# Patient Record
Sex: Male | Born: 1935 | Race: White | Hispanic: No | Smoking: Former smoker
Health system: Southern US, Community
[De-identification: ages and names within clinical notes are randomized; demographics above are authoritative.]

## PROBLEM LIST (undated history)

## (undated) DIAGNOSIS — L820 Inflamed seborrheic keratosis: Secondary | ICD-10-CM

## (undated) DIAGNOSIS — L989 Disorder of the skin and subcutaneous tissue, unspecified: Secondary | ICD-10-CM

## (undated) DIAGNOSIS — J309 Allergic rhinitis, unspecified: Secondary | ICD-10-CM

## (undated) DIAGNOSIS — G8929 Other chronic pain: Secondary | ICD-10-CM

## (undated) DIAGNOSIS — J449 Chronic obstructive pulmonary disease, unspecified: Secondary | ICD-10-CM

## (undated) DIAGNOSIS — G20 Parkinson's disease: Secondary | ICD-10-CM

## (undated) DIAGNOSIS — C801 Malignant (primary) neoplasm, unspecified: Secondary | ICD-10-CM

## (undated) DIAGNOSIS — M25569 Pain in unspecified knee: Secondary | ICD-10-CM

## (undated) DIAGNOSIS — I4821 Permanent atrial fibrillation: Secondary | ICD-10-CM

## (undated) DIAGNOSIS — B351 Tinea unguium: Secondary | ICD-10-CM

## (undated) DIAGNOSIS — M109 Gout, unspecified: Secondary | ICD-10-CM

## (undated) DIAGNOSIS — Z7901 Long term (current) use of anticoagulants: Secondary | ICD-10-CM

## (undated) DIAGNOSIS — E039 Hypothyroidism, unspecified: Secondary | ICD-10-CM

## (undated) DIAGNOSIS — E785 Hyperlipidemia, unspecified: Secondary | ICD-10-CM

## (undated) DIAGNOSIS — K5790 Diverticulosis of intestine, part unspecified, without perforation or abscess without bleeding: Secondary | ICD-10-CM

## (undated) DIAGNOSIS — E7800 Pure hypercholesterolemia, unspecified: Secondary | ICD-10-CM

## (undated) DIAGNOSIS — I495 Sick sinus syndrome: Secondary | ICD-10-CM

## (undated) DIAGNOSIS — R42 Dizziness and giddiness: Secondary | ICD-10-CM

## (undated) DIAGNOSIS — M1990 Unspecified osteoarthritis, unspecified site: Secondary | ICD-10-CM

## (undated) DIAGNOSIS — I2510 Atherosclerotic heart disease of native coronary artery without angina pectoris: Secondary | ICD-10-CM

## (undated) DIAGNOSIS — E559 Vitamin D deficiency, unspecified: Secondary | ICD-10-CM

## (undated) DIAGNOSIS — I10 Essential (primary) hypertension: Secondary | ICD-10-CM

## (undated) DIAGNOSIS — K5900 Constipation, unspecified: Secondary | ICD-10-CM

## (undated) HISTORY — DX: Dizziness and giddiness: R42

## (undated) HISTORY — DX: Pure hypercholesterolemia, unspecified: E78.00

## (undated) HISTORY — DX: Tinea unguium: B35.1

## (undated) HISTORY — DX: Unspecified osteoarthritis, unspecified site: M19.90

## (undated) HISTORY — DX: Essential (primary) hypertension: I10

## (undated) HISTORY — DX: Pain in unspecified knee: M25.569

## (undated) HISTORY — PX: FRACTURE SURGERY: SHX138

## (undated) HISTORY — DX: Allergic rhinitis, unspecified: J30.9

## (undated) HISTORY — DX: Malignant (primary) neoplasm, unspecified: C80.1

## (undated) HISTORY — DX: Hypothyroidism, unspecified: E03.9

## (undated) HISTORY — PX: TYMPANOPLASTY: SHX33

## (undated) HISTORY — DX: Inflamed seborrheic keratosis: L82.0

## (undated) HISTORY — DX: Hyperlipidemia, unspecified: E78.5

## (undated) HISTORY — DX: Diverticulosis of intestine, part unspecified, without perforation or abscess without bleeding: K57.90

## (undated) HISTORY — DX: Permanent atrial fibrillation: I48.21

## (undated) HISTORY — DX: Other chronic pain: G89.29

## (undated) HISTORY — DX: Sick sinus syndrome: I49.5

## (undated) HISTORY — PX: INGUINAL HERNIA REPAIR: SUR1180

## (undated) HISTORY — DX: Gout, unspecified: M10.9

## (undated) HISTORY — DX: Chronic obstructive pulmonary disease, unspecified: J44.9

## (undated) HISTORY — DX: Atherosclerotic heart disease of native coronary artery without angina pectoris: I25.10

## (undated) HISTORY — DX: Long term (current) use of anticoagulants: Z79.01

## (undated) HISTORY — PX: LAPAROSCOPIC PARTIAL COLECTOMY: SHX5907

## (undated) HISTORY — DX: Disorder of the skin and subcutaneous tissue, unspecified: L98.9

## (undated) HISTORY — DX: Constipation, unspecified: K59.00

## (undated) HISTORY — DX: Vitamin D deficiency, unspecified: E55.9

## (undated) HISTORY — DX: Parkinson's disease: G20

---

## 1998-06-28 ENCOUNTER — Ambulatory Visit (HOSPITAL_BASED_OUTPATIENT_CLINIC_OR_DEPARTMENT_OTHER): Admission: RE | Admit: 1998-06-28 | Discharge: 1998-06-28 | Payer: Self-pay | Admitting: General Surgery

## 2000-10-29 ENCOUNTER — Inpatient Hospital Stay (HOSPITAL_COMMUNITY): Admission: AD | Admit: 2000-10-29 | Discharge: 2000-11-01 | Payer: Self-pay | Admitting: Cardiology

## 2000-12-23 ENCOUNTER — Inpatient Hospital Stay (HOSPITAL_COMMUNITY): Admission: AD | Admit: 2000-12-23 | Discharge: 2000-12-24 | Payer: Self-pay | Admitting: Cardiology

## 2001-02-25 ENCOUNTER — Encounter: Admission: RE | Admit: 2001-02-25 | Discharge: 2001-02-25 | Payer: Self-pay | Admitting: Cardiology

## 2001-04-28 ENCOUNTER — Ambulatory Visit (HOSPITAL_COMMUNITY): Admission: RE | Admit: 2001-04-28 | Discharge: 2001-04-28 | Payer: Self-pay | Admitting: *Deleted

## 2001-06-18 HISTORY — PX: PACEMAKER PLACEMENT: SHX43

## 2001-09-29 ENCOUNTER — Ambulatory Visit (HOSPITAL_COMMUNITY): Admission: RE | Admit: 2001-09-29 | Discharge: 2001-09-29 | Payer: Self-pay | Admitting: Cardiology

## 2002-01-12 ENCOUNTER — Ambulatory Visit (HOSPITAL_COMMUNITY): Admission: RE | Admit: 2002-01-12 | Discharge: 2002-01-12 | Payer: Self-pay | Admitting: Cardiology

## 2002-01-20 ENCOUNTER — Ambulatory Visit (HOSPITAL_COMMUNITY): Admission: RE | Admit: 2002-01-20 | Discharge: 2002-01-21 | Payer: Self-pay | Admitting: Cardiology

## 2002-07-07 ENCOUNTER — Ambulatory Visit (HOSPITAL_COMMUNITY): Admission: RE | Admit: 2002-07-07 | Discharge: 2002-07-07 | Payer: Self-pay | Admitting: Cardiology

## 2002-08-06 ENCOUNTER — Ambulatory Visit (HOSPITAL_COMMUNITY): Admission: RE | Admit: 2002-08-06 | Discharge: 2002-08-06 | Payer: Self-pay | Admitting: Critical Care Medicine

## 2002-08-06 ENCOUNTER — Encounter: Payer: Self-pay | Admitting: Critical Care Medicine

## 2004-01-26 ENCOUNTER — Ambulatory Visit (HOSPITAL_COMMUNITY): Admission: RE | Admit: 2004-01-26 | Discharge: 2004-01-26 | Payer: Self-pay | Admitting: Cardiology

## 2005-02-14 ENCOUNTER — Ambulatory Visit (HOSPITAL_COMMUNITY): Admission: RE | Admit: 2005-02-14 | Discharge: 2005-02-14 | Payer: Self-pay | Admitting: Cardiology

## 2006-03-22 ENCOUNTER — Ambulatory Visit (HOSPITAL_COMMUNITY): Admission: RE | Admit: 2006-03-22 | Discharge: 2006-03-22 | Payer: Self-pay | Admitting: Cardiology

## 2007-03-11 ENCOUNTER — Ambulatory Visit (HOSPITAL_COMMUNITY): Admission: RE | Admit: 2007-03-11 | Discharge: 2007-03-11 | Payer: Self-pay | Admitting: Cardiology

## 2008-03-18 ENCOUNTER — Encounter: Admission: RE | Admit: 2008-03-18 | Discharge: 2008-03-18 | Payer: Self-pay | Admitting: Cardiology

## 2008-03-29 ENCOUNTER — Ambulatory Visit (HOSPITAL_COMMUNITY): Admission: RE | Admit: 2008-03-29 | Discharge: 2008-03-29 | Payer: Self-pay | Admitting: Cardiology

## 2008-07-29 ENCOUNTER — Ambulatory Visit (HOSPITAL_COMMUNITY): Admission: RE | Admit: 2008-07-29 | Discharge: 2008-07-29 | Payer: Self-pay | Admitting: Cardiology

## 2010-11-03 NOTE — Cardiovascular Report (Signed)
[place]. [facility]  Patient:    Jerry Wood, Jerry Wood                       MRN: [id number] Proc. Date: 12/23/00 Adm. Date:  [id number] Attending:  Armanda Magic CC:         Pearla Dubonnet, M.D.   Cardiac Catheterization  REFERRING PHYSICIAN:  Lum Babe, M.D.; Pearla Dubonnet, M.D.  PROCEDURES:  Left heart catheterization, coronary arteriography, left ventriculography.  OPERATOR:  Armanda Magic, M.D.  INDICATIONS:  Abnormal Cardiolite, atrial fibrillation, history of coronary disease.  COMPLICATIONS:  None.  IV ACCESS:  Via right femoral artery, 6-French sheath.  HISTORY OF PRESENT ILLNESS:  This is a 77 year old white male with a history of coronary artery disease status post subendocardial myocardial infarction in 1993.  He presented to my office with atrial fibrillation and underwent D-C cardioversion which was successful in converting to sinus rhythm but then had recurrent atrial fibrillation and was placed on amiodarone and then cardioverted again.  He now presents for cardiac catheterization because of an abnormal Cardiolite that was performed.  His Cardiolite showed inferior septal and apical ischemia.  DESCRIPTION OF PROCEDURE:  The patient was brought to the cardiac catheterization laboratory in a fasting, nonsedated state.  Informed consent was obtained.  The patient was connected to continuous heart rate and pulse oximetry monitoring and intermittent blood pressure monitoring.  The right groin was prepped and draped in a sterile fashion.  Xylocaine, 1%, was used for local anesthesia.  Using the modified Seldinger technique, a 6-French sheath was placed in the right femoral artery.  Under fluoroscopic guidance, a 6-French JL4 catheter was placed in the left coronary artery.  Multiple cine films were taken in a 30-degree RAO, 40-degree LAO view.  This catheter was then exchanged out for a 6-French JR4 catheter which was placed in  the right coronary artery.  Multiple cine films were taken in a 30-degree RAO, 40-degree LAO view.  This catheter was then exchanged out over a guidewire for a 6-French straight pigtail catheter which was placed in the left ventricular cavity.  Left ventriculography was then performed in the 30-degree RAO view. The catheter was then pulled back across the aortic valve without any significant pressure gradient noted.  At the end of the procedure, the pigtail catheter was removed over a guidewire.  The patient then went on to percutaneous transluminal intervention by Dr. Katrinka Blazing.  RESULTS: 1. The left main coronary artery was widely patent throughout the course and    bifurcates into a left anterior descending artery and left circumflex. 2. The left anterior descending artery is widely patent throughout the course    and gives rise to a first diagonal which is widely patent and a second    diagonal which has a 30-40% proximal narrowing.  There are luminal    irregularities throughout the mid and distal LAD. 3. The left circumflex gives rise to a very high obtuse marginal branch which    is small and widely patent.  The left circumflex then gives rise to a    second obtuse marginal branch which is widely patent throughout the course    and bifurcates into two daughter branches.  The rest of the left circumflex    traverses the AV groove and is widely patent. 4. The right coronary artery gives rise to an acute marginal branch which was    widely patent.  Then in the mid to  distal portion of the body near a bend,    there is a 70-80% eccentric narrowing.  The distal right coronary artery    bifurcates into a posterior descending artery and a posterolateral artery,    both of which are widely patent.  Left ventriculography performed in the 30-degree RAO view using a total of 24 cc of contrast at 12 cc per second showed normal LV function.  Left ventricular pressure was 128/15 mmHg.  Aortic  pressure was 128/67 mmHg.  IMPRESSION: 1. One-vessel obstructive coronary disease. 2. Normal left ventricular function.  PLAN:  PCI per Dr. Katrinka Blazing. DD:  12/23/00 TD:  12/23/00 Job: [id number]

## 2010-11-03 NOTE — Cardiovascular Report (Signed)
[place]. [facility]  Patient:    Jerry Wood, Jerry Wood                       MRN: [id number] Proc. Date: 12/23/00 Adm. Date:  [id number] Attending:  Armanda Magic CC:         Pearla Dubonnet, M.D.  Armanda Magic, M.D.   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  PROCEDURE PERFORMED: 1. Moderate-risk percutaneous intervention on right coronary artery due to    vessel tortuosity. 2. Coronary stent implantation.  DESCRIPTION:  After informed consent, a 6-French sheath was inserted into the right femoral vein and a percutaneous temporary transvenous pacemaker was placed in the right ventricular apex and ventricular pacing established.  The indication for the pacemaker was bradycardia with a heart rate of 40 at rest. Since we are going to be working in the right coronary territory, I wanted to protect against excessive bradycardia if there was inferior wall ischemia.  We then exchanged the 6-French diagnostic right femoral sheath for a 7-French sheath and percutaneous intervention was performed after 3000 units of IV heparin in a double bolus, followed by an infusion of Integrilin.  We used a 7-French sidehole hockeystick guide catheter, a 300-cm long BMW wire and a 3.5-mm, 15-mm long CrossSail balloon.  One balloon inflation to 8 atmospheres was performed.  We then deployed an 18-mm long, 3.5-mm diameter Penta stent to 17 atmospheres (effective diameter 3.84 mm).  There was proximal spasm proximal to the stent margin and also at the catheter tip.  One hundred micrograms of intracoronary nitroglycerin x 2 led to resolution of the spasm in both spots.  We had an excellent angiographic result, with reduction in stenosis to 0%.  The temporary pacemaker was pulled post procedure.  The patient was stable when he was transferred to floor.  ACT post procedure 266 seconds.  CONCLUSIONS: 1.  Successful percutaneous intervention on the mid right coronary with    reduction in stenosis from 85% to 0%. 2. Asymptomatic bradycardia was treated in the cath lab with a temporary    transvenous pacemaker to protect against excessive bradycardia.  PLAN:  Continue Integrilin x 18 hours.  Pacemaker has already been pulled. Discharge December 24, 2000 per Dr. Armanda Magic.  Aspirin and Plavix x 3 weeks. Whether to continue Coumadin during this three-week period of time will be left to Dr. Mayford Knife.  She could either use low-dose Coumadin or simply avoid Coumadin in combination with antiplatelet therapy and resume Coumadin after Plavix is discontinued. DD:  12/23/00 TD:  12/23/00 Job: [id number]

## 2010-11-03 NOTE — Op Note (Signed)
NAME:  Jerry Wood, Jerry Wood                          ACCOUNT NO.:  [id number]   MEDICAL RECORD NO.:  [id number]                   PATIENT TYPE:  OIB   LOCATION:  6533                                 FACILITY:  [facility]   PHYSICIAN:  Traci R. Mayford Knife, M.D.               DATE OF BIRTH:  03-25-1922   DATE OF PROCEDURE:  01/20/2002  DATE OF DISCHARGE:  01/21/2002                                 OPERATIVE REPORT   PROCEDURE:  Placement of a DDD pacemaker via left extrathoracic subclavian  vein approach.   OPERATOR:  Traci R. Mayford Knife, M.D.   INDICATIONS:  Symptomatic bradycardia, paroxysmal atrial fibrillation.   COMPLICATIONS:  None.   INDICATIONS:  This is a 77 year old white male with a previous history of  paroxysmal atrial fibrillation.  He has been maintained in sinus rhythm up  until recently on amiodarone.  He also has a history of hypertension.  He  has been having some episodic fatigue and was noted to have heart rates in  the 40s.  He subsequently went into atrial fibrillation and then broke out  of it into sinus bradycardia with heart rates in the 40s.  He has been  having problems with profound fatigue and some dizziness since then and now  presents for permanent pacemaker placement.   DESCRIPTION OF PROCEDURE:  The patient was brought to the cardiac  catheterization laboratory in the fasting, nonsedated state.  An informed  consent was obtained.  The patient was connected to continuous heart rate  and pulse oximetry monitoring and intermittent blood pressure monitoring.  The left anterior chest was prepped and draped in a sterile fashion.  Xylocaine 1% was used for local anesthesia.  A 4 cm incision was made over  the left deltopectoral groove.  A combination of electrocautery and blunt  dissection were used to reach the fascial plane.  A combination of blunt  dissection and electrocautery was also used to create the subcutaneous  pocket.  Under ultrasound guidance and  fluoroscopic guidance a guidewire was  placed through the left extrathoracic subclavian vein into the left  subclavian vein.  An 8 French peel-away sheath was placed over the guidewire  under fluoroscopic guidance into the left extrathoracic subclavian vein.  The guidewire and dilator were removed and a ventricular pacing wire was  placed through the sheath under fluoroscopic guidance into the right atrium.  The guidewire was then placed back through the sheath.  The sheath was  peeled away, retaining the guidewire and the lead.  The straight stylet was  then exchanged out for a curved stylet, and the lead was advanced into the  right ventricular outflow tract and confirmed on fluoroscopy, and PVCs were  noted.  The curved stylet was removed and a straight stylet was placed into  the lead.  The lead was then pulled back and allowed to fall across the  tricuspid valve into the  right ventricular apex.  After adequate sensing and  pacing thresholds were attained, the lead was sutured in place with 0 silk.  There was no evidence of diaphragmatic pacing documented as well with 10-  volt pacing.  A second 8 French peel-away sheath was then placed over the  retained guidewire under fluoroscopic guidance.  The dilator and wire were  removed, and an atrial pacing lead was placed through the sheath under  fluoroscopic guidance into the right atrium.  The guidewire was then placed  back through the sheath.  The sheath was peeled away, retaining the  guidewire and the lead.  The atrial lead was a preformed J.  The stylet was  pulled back, allowing the lead to fall into the right atrial appendage.  After adequate sensing and pacing thresholds were obtained, the lead was  sutured into place with 0 silk.  It was also documented that there was no  diaphragmatic pacing with 10-volt pacing.  Under fluoroscopic guidance both  stylets were removed from the lead as well as the guidewire.  The pocket was  then  irrigated with a solution of polymyxin and bacitracin solution.  The  leads were then connected to the generator and placed within the  subcutaneous pocket.  The wound was closed using 2-0 Vicryl for the fascial  layer, 3-0 Vicryl for the subcutaneous layer, and 4-0 Vicryl for the  subcuticular layer.  At the end of the procedure the wound was covered with  Steri-Strips and dressed.  The patient was transferred back to his room in  stable condition.   RESULTS:  1. Device data:  The device is an Insignia I Plus DR, model number 1298     Guidant, serial number [id number].  Ventricular lead is a Guidant model     number 4456, 52-cm bipolar epicardial pacing lead, serial number [id number],     and the atrial lead is a model number 4479, 45-cm Guidant bipolar     epicardial pacing lead, serial number [id number].   1. Pacing data:  The P-wave amplitude was 2.3 millivolts, R-wave amplitude     7.1 millivolts.  Atrial capture threshold was intact a 0.6 volts and 0.5     msec, and ventricular capture threshold was intact at 0.6 volts and 0.5     msec.  Current on the atrial lead was 1.8 MA, current on the ventricular     lead 0.9 MA.  Atrial lead impedance 382 Ohms, ventricular lead impedance     552 Ohms.   The pacemaker was set to DDD pacing at 65 beats per minute.  AV delay was  prolonged to allow intrinsic ventricular conduction.  The base rate was set  at 65 beats per minute.   ASSESSMENT:  1. Symptomatic bradycardia.  2. Paroxysmal atrial fibrillation, maintaining sinus bradycardia on low dose     of amiodarone.  3. Successful implantation of DDD pacemaker.   PLAN:  1. Increase amiodarone to 200 mg a day for full suppression of atrial     fibrillation.  2. Chest x-ray and pacemaker interrogation in the morning.  If okay, will     plan for discharge.  3. The patient will restart Coumadin in 48 hours.                                              Traci R.  Mayford Knife, M.D.    TRT/MEDQ  D:   01/20/2002  T:  01/24/2002  Job:  [id number]   cc:   Barbette Or, M.D.

## 2010-11-03 NOTE — Op Note (Signed)
. [facility]  Patient:    Jerry Wood, Jerry Wood. Visit Number: [id number] MRN: [id number]          Service Type: Attending:  Corbin Ade, M.D. Dictated by:   Corbin Ade, M.D. Proc. Date: 04/28/01                             Operative Report  DATE OF BIRTH:  [birth year]  PREOPERATIVE DIAGNOSIS:  Cataract - OS.  POSTOPERATIVE DIAGNOSIS:  Cataract - OS.  PROCEDURE:  Extracapsular cataract extraction by phacoemulsification with placement of posterior chamber intraocular lens implant - OS.  ANESTHESIA:  Topical.  SURGEON:  Corbin Ade, M.D.  DESCRIPTION OF PROCEDURE:  The patient was brought to the operating room and placed in a supine position.  Attention was given to the left eye only.  The patient was prepped and draped in usual sterile ophthalmic fashion.  The operating microscope was used and a lid speculum was placed.  A 15-degree blade was used to make a paracentesis at the limbus at approximately the 2 oclock position.  Viscoat was then used to reinflate the anterior chamber through the paracentesis.  A 2.8 mm diamond blade was then used to make a clear corneal incision at the limbus at approximately the 11 oclock position. ______ cystitome was then used to initiate a ______ , which was completed with the Utrata forceps.  Balanced salt solution on a straight tube cannula was then used to do a ______ dissection delineation.  Phacoemulsification was done in a divide and conquer manner without any complications.  Residual cortex was removed using the automated irrigation/aspiration handpiece with the silicone tip.  Amvisc was then used to reinflate the anterior chamber and the posterior capsular bag.  An SA-60 AT 19.5 diopter, serial number [id number] folded intraocular lens was then placed directly into the capsular bag using the Monarch injector.  Residual Amvisc was then removed from the anterior chamber and behind the IOL using  the automated irrigation/aspiration handpiece.  The clear corneal wound was then hydrated using balanced salt solution.  The wounds were checked for leaks and none were found.  The patient had a drop of Ciloxan and a drop of Voltaren placed topically on the eye at the end of the case.  The patient left the operating room with a shield over the eye in good and stable condition. Dictated by:   Corbin Ade, M.D. Attending:  Corbin Ade, M.D. DD:  04/28/01 TD:  04/28/01 Job: [id number]

## 2010-11-03 NOTE — Procedures (Signed)
[place]. [facility]  Patient:    Jerry Wood, Jerry Wood                       MRN: [id number] Proc. Date: 11/01/00 Adm. Date:  [id number] Disc. Date: [id number] Attending:  Armanda Magic CC:         Pearla Dubonnet, M.D.   Procedure Report  REFERRING PHYSICIAN:  Pearla Dubonnet, M.D.  PROCEDURE:  Direct current cardioversion.  SURGEON:  Armanda Magic, M.D.  INDICATION:  Atrial fibrillation status post drug loading with amiodarone. Present QTC on EKG is 470 msec.  This is a very pleasant 77 year old white male, patient of Dr. Pearla Dubonnet, who has a history of coronary disease status post MI in 56.  A 2-D echocardiogram recently done for palpitations and atrial fibrillation showed an EF of 50% with concentric LVH.  He was found to be in new onset atrial fibrillation on September 20, 2000 with controlled ventricular response and started on Coumadin.  A 2-D echocardiogram again showed a left atrial enlargement with a left atrial size of 50 mm with mild pulmonary hypertension.  TSH was within normal limits. His INRs have been therapeutic for the past four weeks and he now presents for DC cardioversion after being loaded for three days on amiodarone 400 mg t.i.d.   MEDICATIONS:  IV pentothal 100 mg.  DESCRIPTION OF PROCEDURE:  After informed consent was obtained and the patient had been NPO, he was placed on continuous heart rate and pulse oximetry monitoring and intermittent blood pressure monitoring.  Anesthesia administered pentothal 100 mg IV.  After adequate anesthesia was obtained, the synchronized 200 joule shock was delivered which converted the patient to normal sinus rhythm.  He maintained normal sinus rhythm after the procedure. He tolerated the procedure well.  ASSESSMENT: 1. Atrial fibrillation, status post loading with amiodarone. 2. Successful direct cardioversion to normal sinus rhythm.  PLAN:  Discharge to home after fully  awakened and ambulatory.  Decrease amiodarone to 400 mg p.o. b.i.d. x 1 week, then 400 mg q.d. x 1 week, then 200 mg q.d. maintenance dose.  Decrease Coumadin to 1 gm q.d.  Hold dose today. Recheck PT/INR on Monday at Coumadin Clinic.  DISCHARGE MEDICATIONS: 1. Amiodarone 400 mg b.i.d. x 1 week, 400 mg q.d. x 1 week, and 200 mg q.d.    maintenance. 2. Lipitor 10 mg q.d. 3. Imdur 60 mg q.d. 4. Aspirin q.d. 5. We have discontinued Tizac secondary to borderline low blood pressures and    starting amiodarone.  He will follow up with me in two weeks. DD:  11/01/00 TD:  11/03/00 Job: [id number]

## 2010-11-03 NOTE — Discharge Summary (Signed)
[place]. [facility]  Patient:    Jerry Wood, [place]                       MRN: [id number] Adm. Date:  [id number] Disc. Date: [id number] Attending:  Armanda Magic Dictator:   Anselm Lis, N.P. CC:         Pearla Dubonnet, M.D.   Discharge Summary  DATE OF BIRTH:  March 30, 1922  PROCEDURE:  Nov 01, 2000, successful direct current cardioversion of atrial fibrillation to normal sinus rhythm.  DISCHARGE DIAGNOSES: 1. Recent, new onset, atrial fibrillation.  Elective admission for initiation    of amiodarone therapy which the patient tolerated well without excessive    prolongation of QTC.  He obtained baseline PFTs, TSH, and LFTs which were    all okay. He had been therapeutically anticoagulated for greater than four    weeks prior to admission.  On Nov 01, 2000, after appropriate induction of    anesthesia, he was successfully converted after 200 synchronized joule    shock from atrial fibrillation to normal sinus rhythm. He was discharged    home on continued amiodarone load and taper.  His Cardizem was decreased    at time of admission was 180 mg p.o. q.d. At time of discharge, his Tiazac    Cardizem was discontinued. 2. Systemic anticoagulation secondary to atrial fibrillation.  Admission INR    of 3.3.  Appropriate Coumadin adjustment downward.  At time of discharge    his INR was 3.8.  He was discharged home on decreased Coumadin of 1 mg    p.o. q.d. with follow-up pro time/INR scheduled for Monday, May 20, in our    Coumadin Clinic. 3. Dyslipidemia; on Lipitor. 4. History of myocardial infarction; on Imdur.  No current anginal nor    congestive heart failure complaints.  PLAN:  The patient is discharged home in stable condition.  DISCHARGE MEDICATIONS: 1. (New) Amiodarone 200 mg two tablets b.i.d. for one week, then two tablets    q.d. for one week, then decrease to one tablet p.o. q.d. thereafter. 2. Lipitor 10 mg p.o. q.d. 3. Imdur 60 mg  one p.o. q.d. 4. Enteric-coated aspirin 81 mg p.o. q.d. 5. (Decreased) Coumadin 1 mg tablet p.o. q.d., none on day of discharge.  DIET:  As before.  DISCHARGE INSTRUCTIONS:  Stop Tiazac/Cardizem.  FOLLOW-UP:  Armanda Magic, M.D. on Tuesday, June 4, at 10:45 a.m.  Coumadin Clinic on Monday, May 20, at 12:15 p.m. with [facility] with measurement and subsequent adjustment of Coumadin dosing pending those results.  LABORATORY DATA:  Pulmonary function tests on Oct 31, 2000, revealed normal defusion capacity.  Spirometry within normal range.  WBC 9, hemoglobin 13.1, hematocrit 38.8.  Admission pro time 25.4 with INR 3.3.  PTT 50.  At time of discharge pro time 27.3, INR 3.8.  Sodium 140, potassium 4.1, chloride 107, CO2 26, glucose 97, BUN 19, creatinine 1.0.  LFTs within normal range. Albumin slightly decreased at 3.1.  TSH was within normal range at 3.322.  T3 uptake within normal range at 32.3.  Free T4 within normal range at 1.15. Postcardioversion EKG revealed NSR at 62 beats per minute without ischemic changes.  QTC of 470 milliseconds. DD:  11/26/00 TD:  11/26/00 Job: [id number]

## 2012-03-18 DIAGNOSIS — G20A1 Parkinson's disease without dyskinesia, without mention of fluctuations: Secondary | ICD-10-CM

## 2012-03-18 DIAGNOSIS — G20 Parkinson's disease: Secondary | ICD-10-CM

## 2012-03-18 HISTORY — DX: Parkinson's disease: G20

## 2012-03-18 HISTORY — DX: Parkinson's disease without dyskinesia, without mention of fluctuations: G20.A1

## 2012-07-16 ENCOUNTER — Other Ambulatory Visit: Payer: Self-pay | Admitting: Diagnostic Neuroimaging

## 2012-07-16 DIAGNOSIS — G20 Parkinson's disease: Secondary | ICD-10-CM

## 2012-07-21 ENCOUNTER — Ambulatory Visit
Admission: RE | Admit: 2012-07-21 | Discharge: 2012-07-21 | Disposition: A | Discharge status: Home / Self Care | Payer: Medicare Other | Source: Ambulatory Visit | Attending: Diagnostic Neuroimaging | Admitting: Diagnostic Neuroimaging

## 2012-07-21 DIAGNOSIS — G20 Parkinson's disease: Secondary | ICD-10-CM

## 2012-12-14 ENCOUNTER — Other Ambulatory Visit: Payer: Self-pay | Admitting: Diagnostic Neuroimaging

## 2013-01-14 ENCOUNTER — Encounter: Payer: Self-pay | Admitting: Nurse Practitioner

## 2013-01-14 ENCOUNTER — Ambulatory Visit: Payer: Self-pay | Admitting: Diagnostic Neuroimaging

## 2013-01-14 ENCOUNTER — Ambulatory Visit (INDEPENDENT_AMBULATORY_CARE_PROVIDER_SITE_OTHER): Payer: Medicare Other | Admitting: Nurse Practitioner

## 2013-01-14 VITALS — BP 124/83 | HR 71

## 2013-01-14 DIAGNOSIS — G20 Parkinson's disease: Secondary | ICD-10-CM

## 2013-01-14 DIAGNOSIS — G20A1 Parkinson's disease without dyskinesia, without mention of fluctuations: Secondary | ICD-10-CM | POA: Insufficient documentation

## 2013-01-14 MED ORDER — CARBIDOPA-LEVODOPA 25-100 MG PO TABS: 2.0000 | ORAL_TABLET | Freq: Three times a day (TID) | ORAL | Status: DC

## 2013-01-14 NOTE — Patient Instructions (Addendum)
Increase Carbodopa/Levodopa to 1.5 tablets three times a day.  May increase later to 2 tablets three times a day if dose seems like it is not working as well as it did before.  Follow up in 6 months.

## 2013-01-14 NOTE — Progress Notes (Signed)
[facility]  PATIENT: Jerry Wood DOB: October 01, 1921   HISTORY FROM: patient, daughter REASON FOR VISIT: revist   HISTORICAL  CHIEF COMPLAINT:  Chief Complaint  Patient presents with  . Follow-up    RV #[id number]    HISTORY OF PRESENT ILLNESS: UPDATE 01/14/13 (LL):  Patient comes in for follow up for Parkinson's Disease.  Patient reports that his tremor is worse in both upper extremities, it makes it difficult for him to get to sleep at night.  It does not hinder his ability to feed himself. No problem with taste, smell or appetite.  Denies constipation, has some urinary urgency.  Takes dose of Carb/Levo at 7; 12 and 4.   Feels like medication helped at first but now it's not.  No side effects from med.  He goes to sleep at 10:30 pm.  Denies dreaming or hallucinations.  UPDATE 07/15/12 (VRP): Since last visit, carb/levo has helped his tremor. Now able to fall asleep better because tremor is better controlled. No side effects from medications. Satisfied with the results so far.  Prior HPI (03/26/2012) (VRP): 79 year old right-handed male with history of April fibrillation, hypertension, hypothyroidism, hypercholesterolemia, coronary artery disease, here for evaluation of gait difficulty and tremor. Evaluate for possible Parkinson disease.  In 2010, patient began to develop intermittent left upper extremity resting tremor. Tremor progressed to more continuous and bilateral tremor. He does not have significant difficulty when he is doing actions. In addition by 2011 he began to use a cane for walking imbalance. I 2012 he was using a walker. In 2013 he's been using a wheelchair most of the time. He is able to take a few steps with assistance to the bathroom.  Of note patient's voice has become more quite over the past 2 years. No constipation, sleep disturbance, anxiety or smell or taste difficulty. His tremors worsened nighttime. No family history of tremor  REVIEW OF SYSTEMS:  Full 14 system review of systems performed and notable only for:  Constitutional: N/A  Cardiovascular: N/A  Ear/Nose/Throat: Hearing loss  Skin: N/A  Eyes: N/A  Respiratory: N/A  Gastroitestinal: N/A  Genitourinary: Urination problems Hematology/Lymphatic: N/A  Endocrine: N/A Musculoskeletal:N/A  Allergy/Immunology: Runny nose  Neurological: N/A Psychiatric: N/A   ALLERGIES: Allergies not on file  HOME MEDICATIONS: Outpatient Prescriptions Prior to Visit  Medication Sig Dispense Refill  . carbidopa-levodopa (SINEMET IR) 25-100 MG per tablet Take 1 tablet by mouth 3 (three) times daily. 30 minutes before meals  90 tablet  6   No facility-administered medications prior to visit.   Marland Kitchen diltiazem (CARDIZEM CD) 120 MG 24 hr capsule    Sig:   . levothyroxine (SYNTHROID, LEVOTHROID) 75 MCG tablet    Sig:   . pravastatin (PRAVACHOL) 40 MG tablet    Sig:   . valsartan-hydrochlorothiazide (DIOVAN-HCT) 320-25 MG per tablet    Sig:   . warfarin (COUMADIN) 1 MG tablet    Sig:     PAST MEDICAL HISTORY: No past medical history on file.  PAST SURGICAL HISTORY: No past surgical history on file.  FAMILY HISTORY: No family history on file.  SOCIAL HISTORY: History   Social History  . Marital Status: Widowed    Spouse Name: N/A    Number of Children: N/A  . Years of Education: N/A   Occupational History  . Not on file.   Social History Main Topics  . Smoking status: Not on file  . Smokeless tobacco: Not on file  . Alcohol Use: Not  on file  . Drug Use: Not on file  . Sexually Active: Not on file   Other Topics Concern  . Not on file   Social History Narrative  . No narrative on file     PHYSICAL EXAM  Filed Vitals:   01/14/13 0829  BP: 124/83  Pulse: 71   Cannot calculate BMI with a height equal to zero.  Generalized: In no acute distress, pleasant elderly Caucasian male, in wheelchair  Neck: Supple, no carotid bruits   Cardiac: Irregular rate rhythm,  no murmur   Pulmonary: Clear to auscultation bilaterally   Musculoskeletal: No deformity   Neurological examination   Mentation: Alert oriented to time, place, history taking, language fluent, and casual conversation, comprehension intact. POSITIVE MYERSON'S, POSITIVE SNOUT.  Cranial nerve II-XII: Pupils were equal round reactive to light extraocular movements were full, visual field were full on confrontational test. facial sensation and strength were normal. hearing was intact to finger rubbing bilaterally. Uvula tongue midline. head turning and shoulder shrug and were normal and symmetric.Tongue protrusion into cheek strength was normal. MOTOR: RESTING TREMOR LUE>RUE, NO COGWHEELING. MILD BRADYKINESIA IN RUE. Normal bulk. Full strength in the upper and lower extremities.  No pronator drift. SENSORY: normal and symmetric to light touch. ABSENT VIB AT TOES AND ANKLES. COORDINATION: LUE ACTION TREMOR. REFLEXES: Deep tendon reflexes in the upper and lower extremities are present and symmetric. GAIT/STATION: SEVERE GAIT DIFFICULTY; BARELY ABLE TO STAND.   DIAGNOSTIC DATA (LABS, IMAGING, TESTING) - I reviewed patient records, labs, notes, testing and imaging myself where available.  CT head without contrast 07/21/2012: Mild chronic small vessel ischemic disease with focal chronic lacunar infarcts in the right basal ganglia and left frontal subcortical regions. Mild diffuse atrophy. No acute findings.  ASSESSMENT AND PLAN  79 year old right-handed Caucasian male with history of Atrial fibrillation, hypertension, hypothyroidism, hypercholesteremia, coronary artery disease, here for evaluation of gait difficulty and tremor. He has 4/4 cardinal signs of Parkinson's disease.  PLAN: 1. continue carb/levo, increase to 1.5 tablets three times a day.  May titrate up to 2 tablets three times a day. 2. Follow up in 6 months, sooner as needed.  I have discussed this patient with Dr. Kyla Balzarine, who  participated in developing plan of care and agree with above.  Liahm Grivas NP-C 01/14/2013, 8:34 AM  [facility] [address] [place], [place] [address] [phone]

## 2013-01-14 NOTE — Progress Notes (Signed)
I reviewed note and agree with plan.   Khadir Roam R. Slater Mcmanaman, MD 01/14/2013, 6:27 PM Certified in Neurology, Neurophysiology and Neuroimaging  [facility] [address], [address] [place], [place] [address] [phone]  

## 2013-01-15 ENCOUNTER — Encounter: Payer: Self-pay | Admitting: Diagnostic Neuroimaging

## 2013-03-26 ENCOUNTER — Other Ambulatory Visit: Payer: Self-pay | Admitting: Cardiology

## 2013-04-01 ENCOUNTER — Encounter: Payer: Self-pay | Admitting: Internal Medicine

## 2013-04-01 DIAGNOSIS — I495 Sick sinus syndrome: Secondary | ICD-10-CM

## 2013-04-15 ENCOUNTER — Ambulatory Visit (INDEPENDENT_AMBULATORY_CARE_PROVIDER_SITE_OTHER): Payer: Medicare Other | Admitting: Pharmacist

## 2013-04-15 ENCOUNTER — Ambulatory Visit (INDEPENDENT_AMBULATORY_CARE_PROVIDER_SITE_OTHER): Payer: Medicare Other | Admitting: *Deleted

## 2013-04-15 DIAGNOSIS — I482 Chronic atrial fibrillation, unspecified: Secondary | ICD-10-CM | POA: Insufficient documentation

## 2013-04-15 DIAGNOSIS — I4891 Unspecified atrial fibrillation: Secondary | ICD-10-CM

## 2013-04-15 LAB — PACEMAKER DEVICE OBSERVATION
AL AMPLITUDE: 0.6 mv
AL IMPEDENCE PM: 550 Ohm
BRDY-0002RV: 60 {beats}/min
DEVICE MODEL PM: 594044
RV LEAD AMPLITUDE: 5.4 mv
VENTRICULAR PACING PM: 48

## 2013-04-15 NOTE — Progress Notes (Signed)
PPm check in clinic. Normal device function. Pt in AF 100% of time. + Warfarin. Consider changing mode to VVIR or DDIR at next visit. Battery longevity > 5.0 years. ROV in 3 mths w/JA.

## 2013-04-22 ENCOUNTER — Other Ambulatory Visit: Payer: Self-pay | Admitting: Cardiology

## 2013-04-25 ENCOUNTER — Other Ambulatory Visit: Payer: Self-pay | Admitting: Cardiology

## 2013-05-01 ENCOUNTER — Encounter: Payer: Self-pay | Admitting: Internal Medicine

## 2013-06-03 ENCOUNTER — Encounter: Payer: Self-pay | Admitting: General Surgery

## 2013-06-03 DIAGNOSIS — Z7901 Long term (current) use of anticoagulants: Secondary | ICD-10-CM

## 2013-06-03 DIAGNOSIS — I119 Hypertensive heart disease without heart failure: Secondary | ICD-10-CM

## 2013-06-03 DIAGNOSIS — R609 Edema, unspecified: Secondary | ICD-10-CM | POA: Insufficient documentation

## 2013-06-03 DIAGNOSIS — Z79899 Other long term (current) drug therapy: Secondary | ICD-10-CM

## 2013-06-03 DIAGNOSIS — E7800 Pure hypercholesterolemia, unspecified: Secondary | ICD-10-CM

## 2013-06-03 DIAGNOSIS — I2510 Atherosclerotic heart disease of native coronary artery without angina pectoris: Secondary | ICD-10-CM

## 2013-06-04 ENCOUNTER — Other Ambulatory Visit: Payer: Self-pay | Admitting: Cardiology

## 2013-06-04 NOTE — Telephone Encounter (Signed)
Refill warfarin  

## 2013-06-05 ENCOUNTER — Ambulatory Visit (INDEPENDENT_AMBULATORY_CARE_PROVIDER_SITE_OTHER): Payer: Medicare Other | Admitting: *Deleted

## 2013-06-05 ENCOUNTER — Encounter (INDEPENDENT_AMBULATORY_CARE_PROVIDER_SITE_OTHER): Payer: Self-pay

## 2013-06-05 ENCOUNTER — Encounter: Payer: Self-pay | Admitting: Cardiology

## 2013-06-05 ENCOUNTER — Ambulatory Visit (INDEPENDENT_AMBULATORY_CARE_PROVIDER_SITE_OTHER): Payer: Medicare Other | Admitting: Cardiology

## 2013-06-05 VITALS — BP 96/70 | HR 77 | Ht 60.0 in | Wt 174.0 lb

## 2013-06-05 DIAGNOSIS — I4891 Unspecified atrial fibrillation: Secondary | ICD-10-CM

## 2013-06-05 DIAGNOSIS — I2510 Atherosclerotic heart disease of native coronary artery without angina pectoris: Secondary | ICD-10-CM

## 2013-06-05 DIAGNOSIS — I10 Essential (primary) hypertension: Secondary | ICD-10-CM

## 2013-06-05 DIAGNOSIS — E7800 Pure hypercholesterolemia, unspecified: Secondary | ICD-10-CM

## 2013-06-05 DIAGNOSIS — I482 Chronic atrial fibrillation, unspecified: Secondary | ICD-10-CM

## 2013-06-05 DIAGNOSIS — Z7901 Long term (current) use of anticoagulants: Secondary | ICD-10-CM

## 2013-06-05 LAB — POCT INR: INR: 2.8

## 2013-06-05 NOTE — Progress Notes (Signed)
[address] [place], [place]  [address] Phone: [phone] Fax:  [phone]  Date:  06/05/2013   ID:  DAKARI CREGGER, DOB [birth year], MRN [id number]  PCP:  Pearla Dubonnet, MD  Cardiologist:  Armanda Magic, MD     History of Present Illness: Jerry Wood is a 79 y.o. male with a history of chronic atrial fibrillation, ASCAD, HTN, dyslipidemia and tachybrady syndrome s/p PPM who presents today for followup.  He is doing well.  He denies any chest pain.  He has chronic DOE which is stable and has not changed any.  He denies any LE edema, dizziness, palpitations or syncope.  He occasionally notices a skipped heart beat at night.   Wt Readings from Last 3 Encounters:  06/05/13 174 lb (78.926 kg)  06/03/13 173 lb 9.6 oz (78.744 kg)  07/15/12 167 lb (75.751 kg)     Past Medical History  Diagnosis Date  . Parkinson disease   . Hypertension   . Hypothyroidism   . Atrial fibrillation     chronic  . Coronary artery disease     s/p PCI of the RCA  . COPD, mild     w chronic SOB  . SOB (shortness of breath)     Chronic  . Dyslipidemia   . Chronic knee pain   . Diverticulosis   . Allergic rhinitis   . Gout   . Parkinson's disease 03/2012    Dr Hillard Danker started    Current Outpatient Prescriptions  Medication Sig Dispense Refill  . carbidopa-levodopa (SINEMET IR) 25-100 MG per tablet Take 2 tablets by mouth 3 (three) times daily. 30 minutes before meals  180 tablet  6  . Cholecalciferol (VITAMIN D3) 2000 UNITS TABS Take 2,000 Units by mouth daily.      . clotrimazole-betamethasone (LOTRISONE) cream Apply 1 application topically as needed.      . diltiazem (CARDIZEM CD) 120 MG 24 hr capsule       . fexofenadine (ALLEGRA) 60 MG tablet Take 60 mg by mouth as needed for allergies.       . furosemide (LASIX) 80 MG tablet Take 80 mg by mouth every Monday, Wednesday, and Friday.       . levothyroxine (SYNTHROID, LEVOTHROID) 75 MCG tablet       . Multiple  Vitamin (MULTIVITAMIN) capsule Take 1 capsule by mouth daily.      . Multiple Vitamins-Minerals (PRESERVISION AREDS) CAPS Take by mouth daily.      . pravastatin (PRAVACHOL) 40 MG tablet       . senna-docusate (SENOKOT-S) 8.6-50 MG per tablet Take 1 tablet by mouth daily.      . solifenacin (VESICARE) 10 MG tablet Take 10 mg by mouth as needed.       . valsartan-hydrochlorothiazide (DIOVAN-HCT) 320-25 MG per tablet TAKE 1 TABLET BY MOUTH EVERY DAY  30 tablet  5  . vitamin B-12 (CYANOCOBALAMIN) 1000 MCG tablet Take 1,000 mcg by mouth daily.      Marland Kitchen warfarin (COUMADIN) 1 MG tablet       . ZETIA 10 MG tablet TAKE TAKE 1 TABLET BY MOUTH EVERY DAY  30 tablet  5   No current facility-administered medications for this visit.    Allergies:   No Known Allergies  Social History:  The patient  reports that he quit smoking about 65 years ago. He does not have any smokeless tobacco history on file. He reports that he does not drink alcohol or use  illicit drugs.   Family History:  The patient's family history is not on file.   ROS:  Please see the history of present illness.      All other systems reviewed and negative.   PHYSICAL EXAM: VS:  BP 96/70  Pulse 77  Ht 5' (1.524 m)  Wt 174 lb (78.926 kg)  BMI 33.98 kg/m2  SpO2 96% Well nourished, well developed, in no acute distress HEENT: normal Neck: no JVD Cardiac:  normal S1, S2; RRR; no murmur Lungs:  clear to auscultation bilaterally, no wheezing, rhonchi or rales Abd: soft, nontender, no hepatomegaly Ext: 2+ pedal edema Skin: warm and dry Neuro:  CNs 2-12 intact, no focal abnormalities noted  EKG:  Chronic atrial fibrillation with occasional V pacing     ASSESSMENT AND PLAN:  1. ASCAD with no angina 2. HTN  - continue diltiazem/Diovan HCT 3. Dyslipidemia  - continue Zetia/pravastatin  - I will get a copy of his lipide done in Nov by PCP 4. Tachybrady s/p PPM 5. Chronic LE edema  - continue Lasix 6. Chronic atrial fibrillation  rate controlled  - continue Warfarin/diltiazem   Followup with me in 6 months  Signed, Armanda Magic, MD 06/05/2013 9:10 AM

## 2013-06-05 NOTE — Patient Instructions (Signed)
Your physician recommends that you continue on your current medications as directed. Please refer to the Current Medication list given to you today.  Your physician wants you to follow-up in: 6 months with Dr Turner You will receive a reminder letter in the mail two months in advance. If you don't receive a letter, please call our office to schedule the follow-up appointment.  

## 2013-07-02 ENCOUNTER — Encounter: Payer: Self-pay | Admitting: Internal Medicine

## 2013-07-02 DIAGNOSIS — I495 Sick sinus syndrome: Secondary | ICD-10-CM

## 2013-07-05 ENCOUNTER — Other Ambulatory Visit: Payer: Self-pay | Admitting: Cardiology

## 2013-07-15 ENCOUNTER — Ambulatory Visit (INDEPENDENT_AMBULATORY_CARE_PROVIDER_SITE_OTHER): Payer: Medicare Other | Admitting: *Deleted

## 2013-07-15 DIAGNOSIS — I482 Chronic atrial fibrillation, unspecified: Secondary | ICD-10-CM

## 2013-07-15 DIAGNOSIS — Z5181 Encounter for therapeutic drug level monitoring: Secondary | ICD-10-CM | POA: Insufficient documentation

## 2013-07-15 DIAGNOSIS — I4891 Unspecified atrial fibrillation: Secondary | ICD-10-CM

## 2013-07-15 LAB — POCT INR: INR: 2.9

## 2013-07-17 ENCOUNTER — Encounter: Payer: Self-pay | Admitting: Nurse Practitioner

## 2013-07-17 ENCOUNTER — Ambulatory Visit (INDEPENDENT_AMBULATORY_CARE_PROVIDER_SITE_OTHER): Payer: Medicare Other | Admitting: Nurse Practitioner

## 2013-07-17 ENCOUNTER — Encounter (INDEPENDENT_AMBULATORY_CARE_PROVIDER_SITE_OTHER): Payer: Self-pay

## 2013-07-17 VITALS — BP 97/59 | HR 94

## 2013-07-17 DIAGNOSIS — G20 Parkinson's disease: Secondary | ICD-10-CM

## 2013-07-17 DIAGNOSIS — G20A1 Parkinson's disease without dyskinesia, without mention of fluctuations: Secondary | ICD-10-CM

## 2013-07-17 NOTE — Patient Instructions (Addendum)
PLAN:  1. continue carb/levo, increase to 1.5 tablets three times a day. May increase to 4 times a day if needed, if tremors get worse between doses. 2. Follow up in 6 months, sooner as needed.  Must see [name] next time.

## 2013-07-17 NOTE — Progress Notes (Signed)
PATIENT: Jerry Wood DOB: 01-Sep-1921   REASON FOR VISIT: routine follow up for Parkinson's disease HISTORY FROM: patient  HISTORY OF PRESENT ILLNESS: UPDATE 07/17/13 (LL):  Since last visit, he increased Sinemet to 1.5 tablets and then to 2 tablets TID.   Since being at 2 tablets TID, he states that sometimes it is harder for him to speak; get his words out.  Tremors are still bothersome, and and there is now akathisia with his tongue and jaw. The tremor makes it difficult for him to get to sleep, but when he does sleep, he sleeps well.  Denies dreaming or hallucinations.   UPDATE 01/14/13 (LL): Patient comes in for follow up for Parkinson's Disease. Patient reports that his tremor is worse in both upper extremities, it makes it difficult for him to get to sleep at night. It does not hinder his ability to feed himself. No problem with taste, smell or appetite. Denies constipation, has some urinary urgency. Takes dose of Carb/Levo at 7; 12 and 4. Feels like medication helped at first but now it's not. No side effects from med. He goes to sleep at 10:30 pm. Denies dreaming or hallucinations.   UPDATE 07/15/12 (VRP): Since last visit, carb/levo has helped his tremor. Now able to fall asleep better because tremor is better controlled. No side effects from medications. Satisfied with the results so far.   Prior HPI (03/26/2012) (VRP): 80 year old right-handed male with history of April fibrillation, hypertension, hypothyroidism, hypercholesterolemia, coronary artery disease, here for evaluation of gait difficulty and tremor. Evaluate for possible Parkinson disease.  In 2010, patient began to develop intermittent left upper extremity resting tremor. Tremor progressed to more continuous and bilateral tremor. He does not have significant difficulty when he is doing actions. In addition by 2011 he began to use a cane for walking imbalance. I 2012 he was using a walker. In 2013 he's been using a  wheelchair most of the time. He is able to take a few steps with assistance to the bathroom.  Of note patient's voice has become more quite over the past 2 years. No constipation, sleep disturbance, anxiety or smell or taste difficulty. His tremors worsened nighttime. No family history of tremor   REVIEW OF SYSTEMS: Full 14 system review of systems performed and notable only for:  Ear/Nose/Throat: Hearing loss, runny nose Musculoskeletal: walking difficulty Neurological: tremors  ALLERGIES: No Known Allergies  HOME MEDICATIONS: Outpatient Prescriptions Prior to Visit  Medication Sig Dispense Refill  . carbidopa-levodopa (SINEMET IR) 25-100 MG per tablet Take 2 tablets by mouth 3 (three) times daily. 30 minutes before meals  180 tablet  6  . Cholecalciferol (VITAMIN D3) 2000 UNITS TABS Take 2,000 Units by mouth daily.      . clotrimazole-betamethasone (LOTRISONE) cream Apply 1 application topically as needed.      . diltiazem (CARDIZEM CD) 120 MG 24 hr capsule       . fexofenadine (ALLEGRA) 60 MG tablet Take 60 mg by mouth as needed for allergies.       . furosemide (LASIX) 80 MG tablet Take 80 mg by mouth every Monday, Wednesday, and Friday.       . levothyroxine (SYNTHROID, LEVOTHROID) 75 MCG tablet       . Multiple Vitamin (MULTIVITAMIN) capsule Take 1 capsule by mouth daily.      . Multiple Vitamins-Minerals (PRESERVISION AREDS) CAPS Take by mouth daily.      . pravastatin (PRAVACHOL) 40 MG tablet TAKE 1 TABLET ONCE A  DAY ORALLY  30 tablet  5  . senna-docusate (SENOKOT-S) 8.6-50 MG per tablet Take 1 tablet by mouth daily.      . solifenacin (VESICARE) 10 MG tablet Take 10 mg by mouth as needed.       . valsartan-hydrochlorothiazide (DIOVAN-HCT) 320-25 MG per tablet TAKE 1 TABLET BY MOUTH EVERY DAY  30 tablet  5  . vitamin B-12 (CYANOCOBALAMIN) 1000 MCG tablet Take 1,000 mcg by mouth daily.      Marland Kitchen warfarin (COUMADIN) 1 MG tablet TAKE 2 TABLETS BY MOUTH EVERY DAY EXCEPT 1 TAB ON MONDAY &  FRIDAY  60 tablet  5  . ZETIA 10 MG tablet TAKE TAKE 1 TABLET BY MOUTH EVERY DAY  30 tablet  5   No facility-administered medications prior to visit.    PAST MEDICAL HISTORY: Past Medical History  Diagnosis Date  . Parkinson disease   . Hypertension   . Hypothyroidism   . Atrial fibrillation     chronic  . Coronary artery disease     s/p PCI of the RCA  . COPD, mild     w chronic SOB  . SOB (shortness of breath)     Chronic  . Dyslipidemia   . Chronic knee pain   . Diverticulosis   . Allergic rhinitis   . Gout   . Parkinson's disease 03/2012    Dr Vilinda Blanks started    PAST SURGICAL HISTORY: Past Surgical History  Procedure Laterality Date  . Pacemaker placement      Tachybradycardia syndrome s/p    FAMILY HISTORY: No family history on file.  SOCIAL HISTORY: History   Social History  . Marital Status: Widowed    Spouse Name: N/A    Number of Children: 2  . Years of Education: 7TH   Occupational History  . Not on file.   Social History Main Topics  . Smoking status: Former Smoker    Quit date: 06/18/1948  . Smokeless tobacco: Not on file  . Alcohol Use: No  . Drug Use: No  . Sexual Activity: Not on file   Other Topics Concern  . Not on file   Social History Narrative  . No narrative on file     PHYSICAL EXAM  There were no vitals filed for this visit. There is no weight on file to calculate BMI.  Generalized: In no acute distress, pleasant elderly Caucasian male, in wheelchair  Neck: Supple, no carotid bruits  Cardiac: Irregular rate rhythm, no murmur  Pulmonary: Clear to auscultation bilaterally  Musculoskeletal: No deformity   Neurological examination  Mentation: Alert oriented to time, place, history taking, language fluent, and casual conversation, comprehension intact. POSITIVE MYERSON'S, POSITIVE SNOUT.  Cranial nerve II-XII: Pupils were equal round reactive to light extraocular movements were full, visual field were full on  confrontational test. facial sensation and strength were normal. hearing was intact to finger rubbing bilaterally. Uvula tongue midline. head turning and shoulder shrug and were normal and symmetric.Tongue protrusion into cheek strength was normal.  TONGUE AND JAW AKATHISIA. MOTOR: RESTING TREMOR RUE>LUE, NO COGWHEELING. MILD BRADYKINESIA IN RUE. Normal bulk. Full strength in the upper and lower extremities. No pronator drift.  SENSORY: normal and symmetric to light touch. ABSENT VIB AT TOES AND ANKLES.  COORDINATION: LUE ACTION TREMOR.  REFLEXES: Deep tendon reflexes in the upper and lower extremities are present and symmetric.  GAIT/STATION: SEVERE GAIT DIFFICULTY; BARELY ABLE TO STAND.   DIAGNOSTIC DATA (LABS, IMAGING, TESTING) - I reviewed patient records,  labs, notes, testing and imaging myself where available.  CT head without contrast 07/21/2012: Mild chronic small vessel ischemic disease with focal chronic lacunar infarcts in the right basal ganglia and left frontal subcortical regions. Mild diffuse atrophy. No acute findings.   ASSESSMENT AND PLAN  80 year old right-handed Caucasian male with history of Atrial fibrillation, hypertension, hypothyroidism, hypercholesteremia, coronary artery disease, here for evaluation of gait difficulty and tremor. He has 4/4 cardinal signs of Parkinson's disease with worsening tremor, Akathisia with increased dose of sinemet (2 tabs TID).   PLAN:  1. continue carb/levo, decrease to 1.5 tablets three times a day. May increase to 4 times a day if needed.  2. Follow up in 6 months, sooner as needed.  Must see [name] next time.  Philmore Pali, MSN, NP-C 07/17/2013, 9:02 AM [facility] [address] [place], [place] [address] [phone]  Note: This document was prepared with digital dictation and possible smart phrase technology. Any transcriptional errors that result from this process are unintentional.

## 2013-07-20 ENCOUNTER — Encounter: Payer: Self-pay | Admitting: Internal Medicine

## 2013-07-20 ENCOUNTER — Ambulatory Visit (INDEPENDENT_AMBULATORY_CARE_PROVIDER_SITE_OTHER): Payer: Medicare Other | Admitting: Internal Medicine

## 2013-07-20 VITALS — BP 102/60 | HR 93 | Ht 60.0 in | Wt 173.0 lb

## 2013-07-20 DIAGNOSIS — Z950 Presence of cardiac pacemaker: Secondary | ICD-10-CM | POA: Insufficient documentation

## 2013-07-20 DIAGNOSIS — Z7901 Long term (current) use of anticoagulants: Secondary | ICD-10-CM

## 2013-07-20 DIAGNOSIS — I2510 Atherosclerotic heart disease of native coronary artery without angina pectoris: Secondary | ICD-10-CM

## 2013-07-20 DIAGNOSIS — R609 Edema, unspecified: Secondary | ICD-10-CM

## 2013-07-20 DIAGNOSIS — I4891 Unspecified atrial fibrillation: Secondary | ICD-10-CM

## 2013-07-20 DIAGNOSIS — I482 Chronic atrial fibrillation, unspecified: Secondary | ICD-10-CM

## 2013-07-20 DIAGNOSIS — I495 Sick sinus syndrome: Secondary | ICD-10-CM

## 2013-07-20 DIAGNOSIS — I10 Essential (primary) hypertension: Secondary | ICD-10-CM

## 2013-07-20 LAB — MDC_IDC_ENUM_SESS_TYPE_INCLINIC
Brady Statistic RV Percent Paced: 50 %
Implantable Pulse Generator Serial Number: 594044
Lead Channel Setting Pacing Amplitude: 2.4 V
Lead Channel Setting Pacing Pulse Width: 0.4 ms
MDC IDC MSMT LEADCHNL RV PACING THRESHOLD AMPLITUDE: 1 V
MDC IDC MSMT LEADCHNL RV PACING THRESHOLD PULSEWIDTH: 0.4 ms
MDC IDC SET LEADCHNL RA PACING AMPLITUDE: 2 V

## 2013-07-20 NOTE — Patient Instructions (Signed)
Your physician wants you to follow-up in: 12 months with Ileene Hutchinson, PA  You will receive a reminder letter in the mail two months in advance. If you don't receive a letter, please call our office to schedule the follow-up appointment.

## 2013-07-20 NOTE — Progress Notes (Signed)
Jerry Screws, MD: Primary Cardiologist:  Dr Donnamarie Poag is a 80 y.o. male with a h/o bradycardia/tachycardia syndrome sp PPM (MDT) by Dr Leonia Reeves who presents today to establish care in the Electrophysiology device clinic.  His initial device was implanted in 2003.  He underwent generator change in 2010. The patient reports doing very well since having a pacemaker implanted.  He is chronically debilitated and cannot walk.  There does not appear to be much that he enjoys.   Today, he  denies symptoms of palpitations, chest pain, shortness of breath, orthopnea, PND,   presyncope, syncope, or neurologic sequela. He has stable dizziness.  + chronic edema.  The patientis tolerating medications without difficulties and is otherwise without complaint today.   Past Medical History  Diagnosis Date  . Hypertension   . COPD, mild     w chronic SOB  . SOB (shortness of breath)     Chronic  . Dyslipidemia   . Chronic knee pain   . Diverticulosis   . Allergic rhinitis   . Gout   . Parkinson's disease 03/2012    Dr Vilinda Blanks started  . Onychomycosis   . Skin lesion     Plan to remove left posterior ear lesion with electrocautery  . Pure hypercholesterolemia   . Vitamin D deficiency   . Unspecified hypothyroidism   . Chronic fatigue   . Osteoarthrosis, unspecified whether generalized or localized, other specified sites     Has gait disorder secondary to DJD of lower extremities  . Gait disorder     Has gait disorder secondary to DJD of lower extremities  . Acute bronchitis   . Benign hypertensive heart disease without heart failure   . Vertigo   . Chronic edema     Chronic mild ankle edema  . Cerumen impaction   . Hypotension, unspecified   . Long term (current) use of anticoagulants   . Urinary urgency   . Seborrheic keratosis, inflamed   . Constipation   . Coronary artery disease     s/p PCI of the RCA  . Permanent atrial fibrillation     Chronic. ECHO  06/04/11 LVEF estimated by 2D at 52.1%  . Tachy-brady syndrome     s/p permanent pacemaker placement   Past Surgical History  Procedure Laterality Date  . Pacemaker placement  2003    Tachybradycardia syndrome. Gen change 07/29/08, Dr. Leonia Reeves  . Laparoscopic partial colectomy      Partial resection of sigmoid colon  . Inguinal hernia repair Bilateral   . Fracture surgery Right     clavicle  . Tympanoplasty Bilateral     History   Social History  . Marital Status: Widowed    Spouse Name: N/A    Number of Children: 2  . Years of Education: 7TH   Occupational History  . Not on file.   Social History Main Topics  . Smoking status: Former Smoker -- 10 years    Types: Cigarettes    Quit date: 06/18/1948  . Smokeless tobacco: Not on file  . Alcohol Use: No  . Drug Use: No  . Sexual Activity: Not on file   Other Topics Concern  . Not on file   Social History Narrative  . No narrative on file    Family History  Problem Relation Age of Onset  . CAD Father   . Diabetes Father   . Bladder Cancer    . Leukemia  No Known Allergies  Current Outpatient Prescriptions  Medication Sig Dispense Refill  . carbidopa-levodopa (SINEMET IR) 25-100 MG per tablet Take 2 tablets by mouth 3 (three) times daily. 30 minutes before meals  180 tablet  6  . Cholecalciferol (VITAMIN D3) 2000 UNITS TABS Take 2,000 Units by mouth daily.      . clotrimazole-betamethasone (LOTRISONE) cream Apply 1 application topically as needed.      . diltiazem (CARDIZEM CD) 120 MG 24 hr capsule       . fexofenadine (ALLEGRA) 60 MG tablet Take 60 mg by mouth as needed for allergies.       . furosemide (LASIX) 80 MG tablet Take 80 mg by mouth every Monday, Wednesday, and Friday.       . levothyroxine (SYNTHROID, LEVOTHROID) 75 MCG tablet       . Multiple Vitamin (MULTIVITAMIN) capsule Take 1 capsule by mouth daily.      . Multiple Vitamins-Minerals (PRESERVISION AREDS) CAPS Take by mouth daily.      .  pravastatin (PRAVACHOL) 40 MG tablet TAKE 1 TABLET ONCE A DAY ORALLY  30 tablet  5  . senna-docusate (SENOKOT-S) 8.6-50 MG per tablet Take 1 tablet by mouth daily.      . solifenacin (VESICARE) 10 MG tablet Take 10 mg by mouth as needed.       . valsartan-hydrochlorothiazide (DIOVAN-HCT) 320-25 MG per tablet TAKE 1 TABLET BY MOUTH EVERY DAY  30 tablet  5  . vitamin B-12 (CYANOCOBALAMIN) 1000 MCG tablet Take 1,000 mcg by mouth daily.      Marland Kitchen warfarin (COUMADIN) 1 MG tablet TAKE 2 TABLETS BY MOUTH EVERY DAY EXCEPT 1 TAB ON MONDAY & FRIDAY  60 tablet  5  . ZETIA 10 MG tablet TAKE TAKE 1 TABLET BY MOUTH EVERY DAY  30 tablet  5   No current facility-administered medications for this visit.    ROS- all systems are reviewed and negative except as per HPI  Physical Exam: Filed Vitals:   07/20/13 0925  BP: 102/60  Pulse: 93  Height: 5' (1.524 m)  Weight: 173 lb (78.472 kg)    GEN- The patient is elderly appearing, alert and oriented x 3 today.   Head- normocephalic, atraumatic Eyes-  Sclera clear, conjunctiva pink Ears- hearing intact Oropharynx- clear Neck- supple,   Lungs- Clear to ausculation bilaterally, normal work of breathing Chest- pacemaker pocket is well healed Heart- irregular rate and rhythm  GI- soft, NT, ND, + BS Extremities- no clubbing, cyanosis, + dependant edema MS- diffuse muscle atrophy, in a wheelchair today Skin- no rash or lesion Psych- euthymic mood, full affect Neuro-  + prominent resting tremor  Pacemaker interrogation- reviewed in detail today,  See PACEART report ekg today reveals afib, V rate 90 bpm with demand V pacing, nonspecific St/T changes Dr Landis Gandy records are reviewed  Assessment and Plan:  1. Tachycardia/ bradycardia syndrome Normal pacemaker function See Pace Art report Reprogramed to VVIR due to permanent afib  2. Permanent afib Rate controlled chads2vasc score is at least 4.  Continue long term anticoagulation  3.  Hypotension Stable Consider stopping lasix if not improved (See below)  4. Venous insufficiency Consider support hose as an alternative to diuresis I will defer to primary cardiologist  5. CAD Stable No change required today  TTMs Return to see [name] in the device clinic in 1 year

## 2013-07-28 ENCOUNTER — Encounter: Payer: Self-pay | Admitting: Internal Medicine

## 2013-08-11 ENCOUNTER — Encounter: Payer: Self-pay | Admitting: Nurse Practitioner

## 2013-08-26 ENCOUNTER — Ambulatory Visit (INDEPENDENT_AMBULATORY_CARE_PROVIDER_SITE_OTHER): Payer: Medicare Other | Admitting: *Deleted

## 2013-08-26 DIAGNOSIS — Z5181 Encounter for therapeutic drug level monitoring: Secondary | ICD-10-CM

## 2013-08-26 DIAGNOSIS — I482 Chronic atrial fibrillation, unspecified: Secondary | ICD-10-CM

## 2013-08-26 DIAGNOSIS — I4891 Unspecified atrial fibrillation: Secondary | ICD-10-CM

## 2013-08-26 LAB — POCT INR: INR: 2.9

## 2013-09-07 ENCOUNTER — Other Ambulatory Visit: Payer: Self-pay | Admitting: *Deleted

## 2013-09-07 MED ORDER — DILTIAZEM HCL ER COATED BEADS 120 MG PO CP24: 120.0000 mg | ORAL_CAPSULE | Freq: Every day | ORAL | Status: DC

## 2013-10-01 DIAGNOSIS — I495 Sick sinus syndrome: Secondary | ICD-10-CM

## 2013-10-09 ENCOUNTER — Ambulatory Visit (INDEPENDENT_AMBULATORY_CARE_PROVIDER_SITE_OTHER): Payer: Medicare Other

## 2013-10-09 DIAGNOSIS — I4891 Unspecified atrial fibrillation: Secondary | ICD-10-CM

## 2013-10-09 DIAGNOSIS — I482 Chronic atrial fibrillation, unspecified: Secondary | ICD-10-CM

## 2013-10-09 DIAGNOSIS — Z5181 Encounter for therapeutic drug level monitoring: Secondary | ICD-10-CM

## 2013-10-09 LAB — POCT INR: INR: 3.1

## 2013-10-19 ENCOUNTER — Other Ambulatory Visit: Payer: Self-pay | Admitting: Neurology

## 2013-10-19 NOTE — Telephone Encounter (Signed)
Last Ov note says: PLAN:  1. continue carb/levo, decrease to 1.5 tablets three times a day. May increase to 4 times a day if needed.

## 2013-10-22 ENCOUNTER — Other Ambulatory Visit: Payer: Self-pay

## 2013-10-22 MED ORDER — EZETIMIBE 10 MG PO TABS: ORAL_TABLET | ORAL | Status: DC

## 2013-11-05 ENCOUNTER — Encounter: Payer: Self-pay | Admitting: Internal Medicine

## 2013-11-13 ENCOUNTER — Ambulatory Visit (INDEPENDENT_AMBULATORY_CARE_PROVIDER_SITE_OTHER): Payer: Medicare Other | Admitting: *Deleted

## 2013-11-13 DIAGNOSIS — Z5181 Encounter for therapeutic drug level monitoring: Secondary | ICD-10-CM

## 2013-11-13 DIAGNOSIS — I4891 Unspecified atrial fibrillation: Secondary | ICD-10-CM

## 2013-11-13 DIAGNOSIS — I482 Chronic atrial fibrillation, unspecified: Secondary | ICD-10-CM

## 2013-11-13 LAB — POCT INR: INR: 2.2

## 2013-12-08 ENCOUNTER — Other Ambulatory Visit: Payer: Self-pay | Admitting: *Deleted

## 2013-12-08 MED ORDER — WARFARIN SODIUM 1 MG PO TABS: ORAL_TABLET | ORAL | Status: DC

## 2013-12-16 IMAGING — CT CT HEAD W/O CM
2 series · 16 of 30 positions shown, 20 images · non-contrast
Comparison: none

[Series 3: head bone · axial · 0.49mm/px · z∈[-21,+25]mm · 3 of 32 slices shown]
[im 3/32  bone]
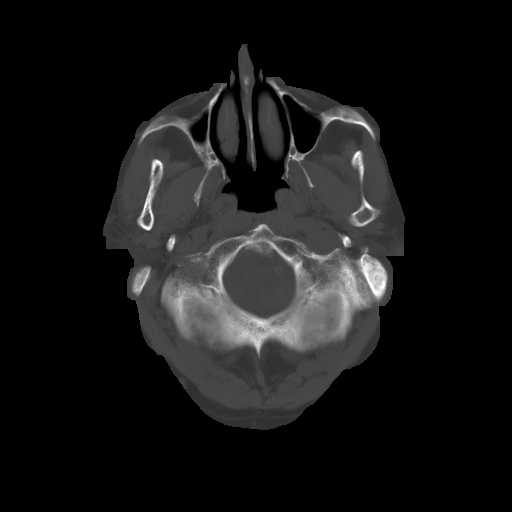
[im 7/32  bone]
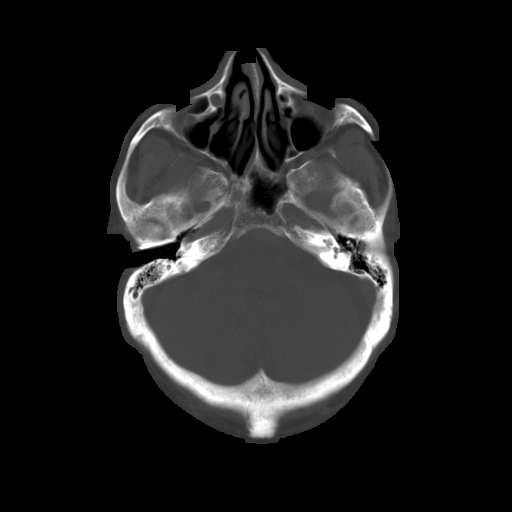
[im 12/32  bone]
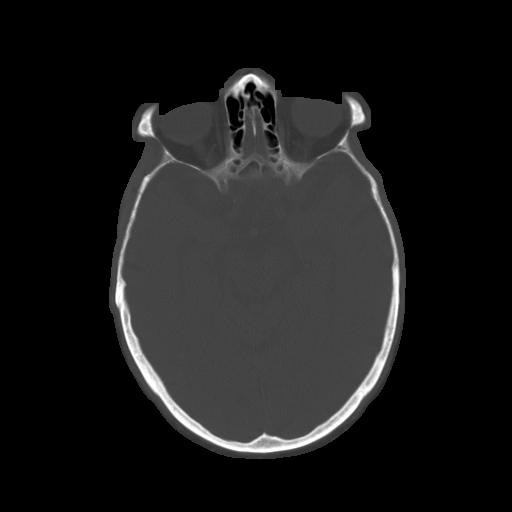

[Series 32: 3d filtered head w/o · axial · non-contrast · 0.49mm/px · z∈[-21,+111]mm · 13 of 32 slices shown, 17 images]
[im 3/32  brain]
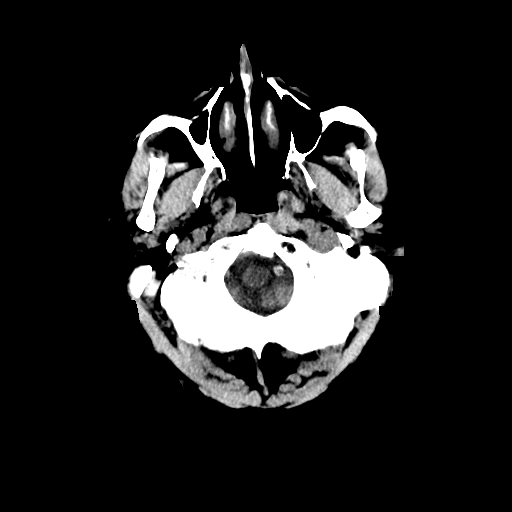
[im 3/32  bone]
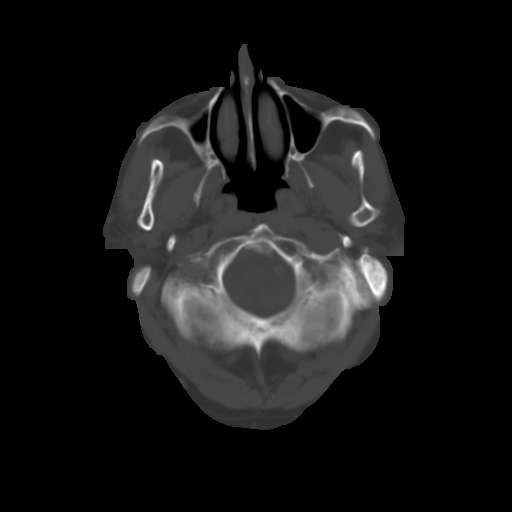
[im 5/32  brain]
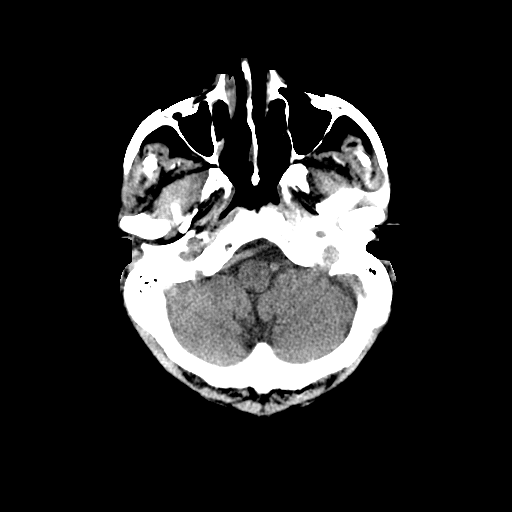
[im 7/32  brain]
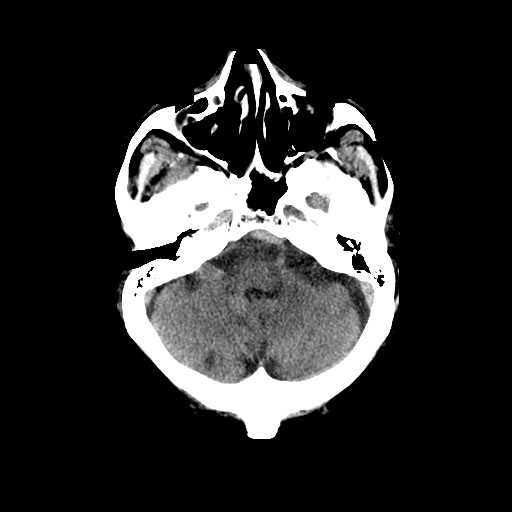
[im 9/32  brain]
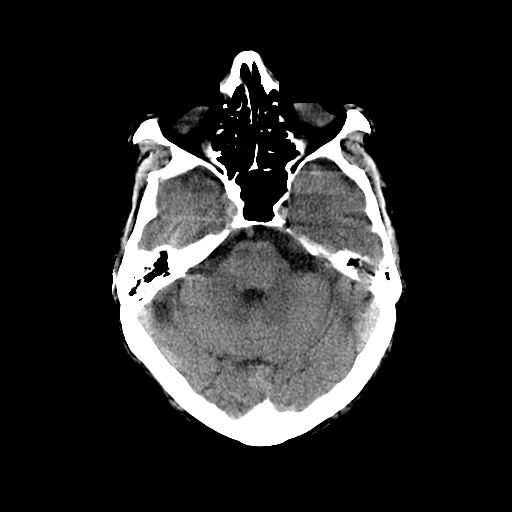
[im 12/32  brain]
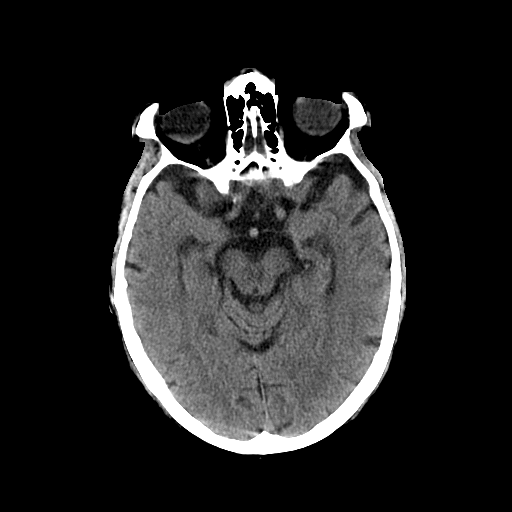
[im 12/32  bone]
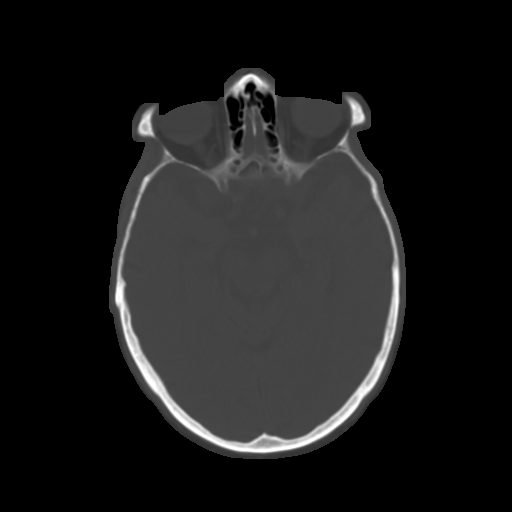
[im 14/32  brain]
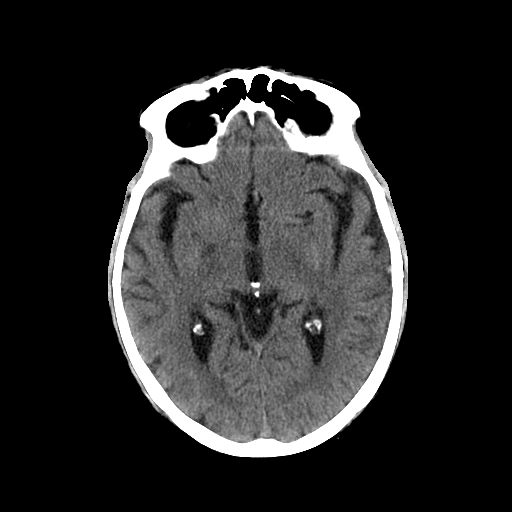
[im 16/32  brain]
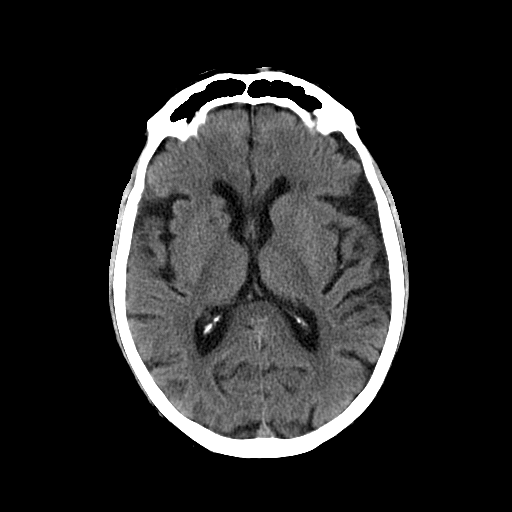
[im 18/32  brain]
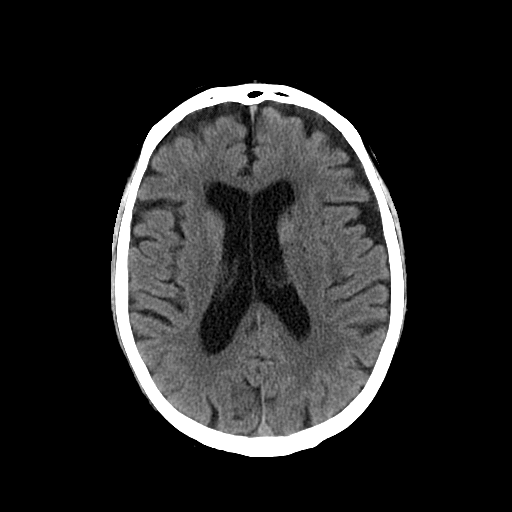
[im 20/32  brain]
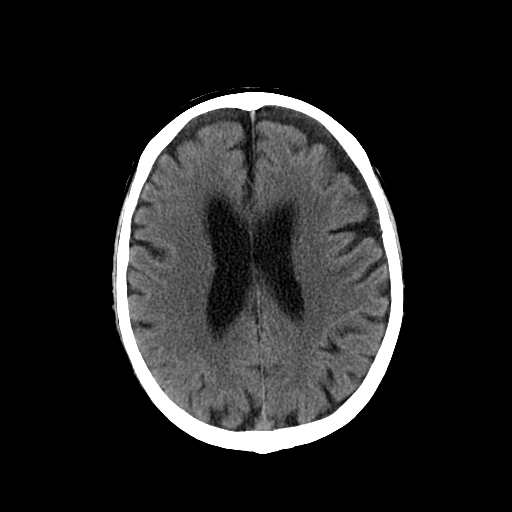
[im 20/32  bone]
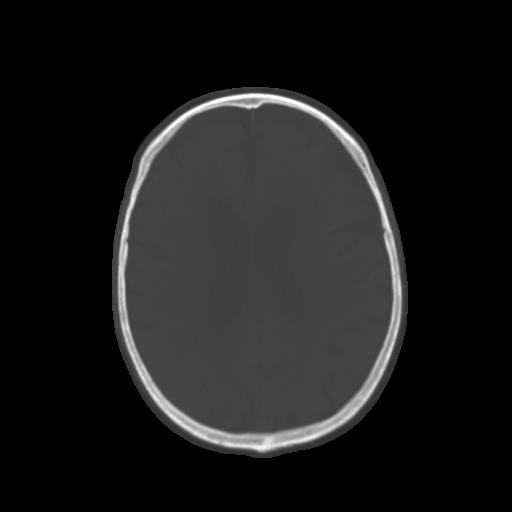
[im 23/32  brain]
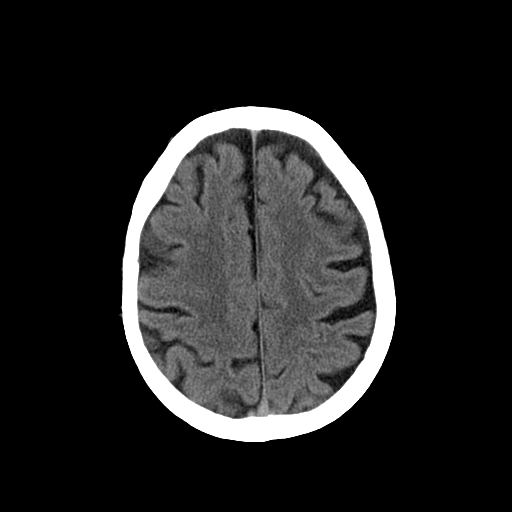
[im 25/32  brain]
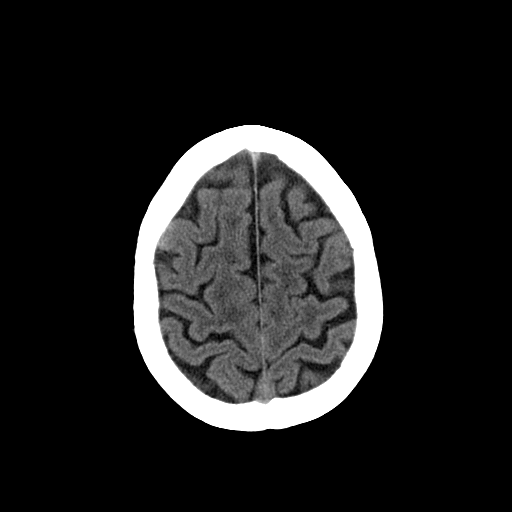
[im 27/32  brain]
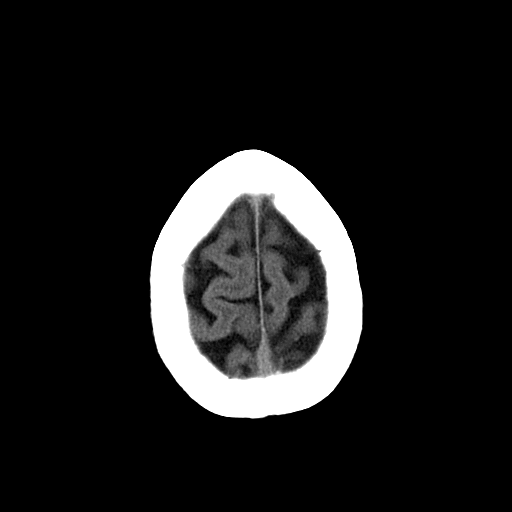
[im 29/32  brain]
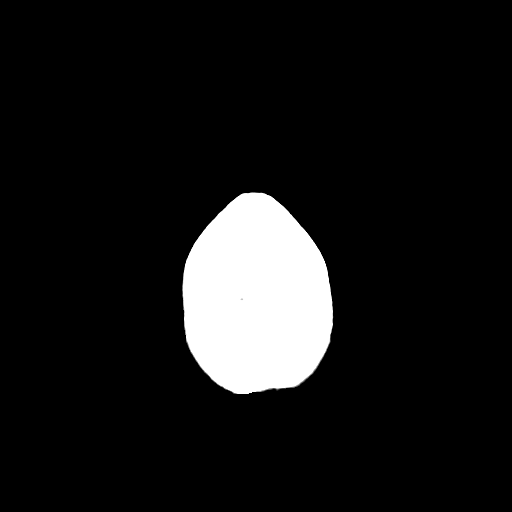
[im 29/32  bone]
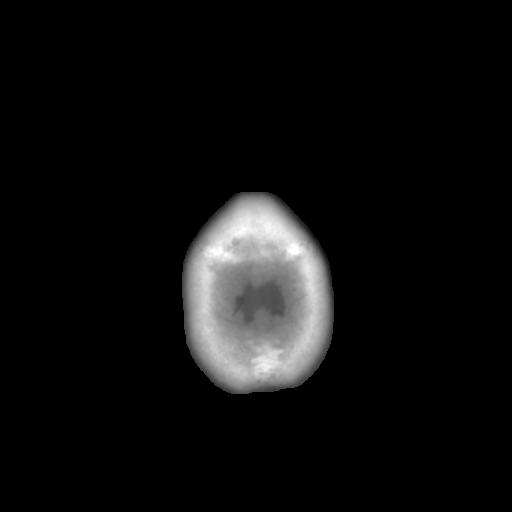

[16 of 30 positions shown; findings below may reference images not displayed]

This examination was performed at [HOSPITAL] at [HOSPITAL]
[HOSPITAL]. The interpretation will be provided by [REDACTED].

## 2013-12-17 ENCOUNTER — Ambulatory Visit (INDEPENDENT_AMBULATORY_CARE_PROVIDER_SITE_OTHER): Payer: Medicare Other | Admitting: Surgery

## 2013-12-17 DIAGNOSIS — Z5181 Encounter for therapeutic drug level monitoring: Secondary | ICD-10-CM

## 2013-12-17 DIAGNOSIS — I4891 Unspecified atrial fibrillation: Secondary | ICD-10-CM

## 2013-12-17 DIAGNOSIS — I482 Chronic atrial fibrillation, unspecified: Secondary | ICD-10-CM

## 2013-12-17 LAB — POCT INR: INR: 2.9

## 2013-12-31 ENCOUNTER — Encounter: Payer: Self-pay | Admitting: Internal Medicine

## 2013-12-31 DIAGNOSIS — I495 Sick sinus syndrome: Secondary | ICD-10-CM

## 2014-01-05 ENCOUNTER — Other Ambulatory Visit: Payer: Self-pay

## 2014-01-05 MED ORDER — PRAVASTATIN SODIUM 40 MG PO TABS: ORAL_TABLET | ORAL | Status: DC

## 2014-01-15 ENCOUNTER — Ambulatory Visit: Payer: Medicare Other | Admitting: Diagnostic Neuroimaging

## 2014-01-18 ENCOUNTER — Other Ambulatory Visit: Payer: Self-pay | Admitting: *Deleted

## 2014-01-18 MED ORDER — DILTIAZEM HCL ER COATED BEADS 120 MG PO CP24: 120.0000 mg | ORAL_CAPSULE | Freq: Every day | ORAL | Status: DC

## 2014-01-21 ENCOUNTER — Ambulatory Visit (INDEPENDENT_AMBULATORY_CARE_PROVIDER_SITE_OTHER): Payer: Medicare Other | Admitting: *Deleted

## 2014-01-21 DIAGNOSIS — I482 Chronic atrial fibrillation, unspecified: Secondary | ICD-10-CM

## 2014-01-21 DIAGNOSIS — I4891 Unspecified atrial fibrillation: Secondary | ICD-10-CM

## 2014-01-21 DIAGNOSIS — Z5181 Encounter for therapeutic drug level monitoring: Secondary | ICD-10-CM

## 2014-01-21 LAB — POCT INR: INR: 4.1

## 2014-01-28 ENCOUNTER — Ambulatory Visit: Payer: Medicare Other | Admitting: Cardiology

## 2014-02-02 ENCOUNTER — Encounter: Payer: Self-pay | Admitting: Cardiology

## 2014-02-02 ENCOUNTER — Ambulatory Visit (INDEPENDENT_AMBULATORY_CARE_PROVIDER_SITE_OTHER): Payer: Medicare Other | Admitting: Cardiology

## 2014-02-02 VITALS — BP 90/60 | HR 62 | Ht 60.0 in | Wt 174.0 lb

## 2014-02-02 DIAGNOSIS — E7800 Pure hypercholesterolemia, unspecified: Secondary | ICD-10-CM

## 2014-02-02 DIAGNOSIS — I495 Sick sinus syndrome: Secondary | ICD-10-CM

## 2014-02-02 DIAGNOSIS — I10 Essential (primary) hypertension: Secondary | ICD-10-CM

## 2014-02-02 DIAGNOSIS — I4891 Unspecified atrial fibrillation: Secondary | ICD-10-CM

## 2014-02-02 DIAGNOSIS — I2510 Atherosclerotic heart disease of native coronary artery without angina pectoris: Secondary | ICD-10-CM

## 2014-02-02 DIAGNOSIS — I482 Chronic atrial fibrillation, unspecified: Secondary | ICD-10-CM

## 2014-02-02 DIAGNOSIS — Z7901 Long term (current) use of anticoagulants: Secondary | ICD-10-CM

## 2014-02-02 DIAGNOSIS — Z950 Presence of cardiac pacemaker: Secondary | ICD-10-CM

## 2014-02-02 MED ORDER — DILTIAZEM HCL ER COATED BEADS 120 MG PO CP24: 120.0000 mg | ORAL_CAPSULE | Freq: Every day | ORAL | Status: DC

## 2014-02-02 MED ORDER — VALSARTAN 320 MG PO TABS
320.0000 mg | ORAL_TABLET | Freq: Every day | ORAL | Status: DC
Start: 2014-02-02 — End: 2014-03-11

## 2014-02-02 NOTE — Progress Notes (Signed)
[address], [place], [place]  [address] Phone: [phone] Fax:  [phone]  Date:  02/02/2014   ID:  RANEY KOEPPEN, DOB [birth year], MRN [id number]  PCP:  Henrine Screws, MD  Cardiologist:  Fransico Him, MD     History of Present Illness:  Jerry Wood is a 80 y.o. male with a history of chronic atrial fibrillation, ASCAD, HTN, dyslipidemia and tachybrady syndrome s/p PPM who presents today for followup. He is doing well. He denies any chest pain. He has chronic DOE which is stable and has not changed any. He denies any dizziness or syncope. He occasionally notices a skipped heart beat at night. He has chronic LE edema which is stable.   Wt Readings from Last 3 Encounters:  02/02/14 174 lb (78.926 kg)  07/20/13 173 lb (78.472 kg)  06/05/13 174 lb (78.926 kg)     Past Medical History  Diagnosis Date  . Hypertension   . COPD, mild     w chronic SOB  . SOB (shortness of breath)     Chronic  . Dyslipidemia   . Chronic knee pain   . Diverticulosis   . Allergic rhinitis   . Gout   . Parkinson's disease 03/2012    Dr Vilinda Blanks started  . Onychomycosis   . Skin lesion     Plan to remove left posterior ear lesion with electrocautery  . Pure hypercholesterolemia   . Vitamin D deficiency   . Unspecified hypothyroidism   . Chronic fatigue   . Osteoarthrosis, unspecified whether generalized or localized, other specified sites     Has gait disorder secondary to DJD of lower extremities  . Gait disorder     Has gait disorder secondary to DJD of lower extremities  . Acute bronchitis   . Benign hypertensive heart disease without heart failure   . Vertigo   . Chronic edema     Chronic mild ankle edema  . Cerumen impaction   . Hypotension, unspecified   . Long term (current) use of anticoagulants   . Urinary urgency   . Seborrheic keratosis, inflamed   . Constipation   . Coronary artery disease     s/p PCI of the RCA  . Permanent atrial  fibrillation     Chronic. ECHO 06/04/11 LVEF estimated by 2D at 52.1%  . Tachy-brady syndrome     s/p permanent pacemaker placement    Current Outpatient Prescriptions  Medication Sig Dispense Refill  . carbidopa-levodopa (SINEMET IR) 25-100 MG per tablet One and one half tab three to four times daily as directed.  180 tablet  6  . Cholecalciferol (VITAMIN D3) 2000 UNITS TABS Take 2,000 Units by mouth daily.      . clotrimazole-betamethasone (LOTRISONE) cream Apply 1 application topically as needed.      . diltiazem (CARDIZEM CD) 120 MG 24 hr capsule Take 1 capsule (120 mg total) by mouth daily.  30 capsule  0  . ezetimibe (ZETIA) 10 MG tablet TAKE 1 TABLET BY MOUTH EVERY DAY  30 tablet  5  . fexofenadine (ALLEGRA) 60 MG tablet Take 60 mg by mouth as needed for allergies.       . furosemide (LASIX) 80 MG tablet Take 80 mg by mouth daily.      Marland Kitchen levothyroxine (SYNTHROID, LEVOTHROID) 75 MCG tablet Take by mouth daily before breakfast.       . Multiple Vitamin (MULTIVITAMIN) capsule Take 1 capsule by mouth daily.      Marland Kitchen  Multiple Vitamins-Minerals (PRESERVISION AREDS) CAPS Take by mouth daily.      . pravastatin (PRAVACHOL) 40 MG tablet TAKE 1 TABLET ONCE A DAY ORALLY  30 tablet  5  . senna-docusate (SENOKOT-S) 8.6-50 MG per tablet Take 1 tablet by mouth daily.      . solifenacin (VESICARE) 10 MG tablet Take 10 mg by mouth as needed.       . valsartan-hydrochlorothiazide (DIOVAN-HCT) 320-25 MG per tablet TAKE 1 TABLET BY MOUTH EVERY DAY  30 tablet  5  . vitamin B-12 (CYANOCOBALAMIN) 1000 MCG tablet Take 1,000 mcg by mouth daily.      Marland Kitchen warfarin (COUMADIN) 1 MG tablet 2 tablets on all days except 1 tablet on Mondays or as directed by coumadin clinic  60 tablet  2   No current facility-administered medications for this visit.    Allergies:   No Known Allergies  Social History:  The patient  reports that he quit smoking about 65 years ago. His smoking use included Cigarettes. He smoked 0.00  packs per day for 10 years. He does not have any smokeless tobacco history on file. He reports that he does not drink alcohol or use illicit drugs.   Family History:  The patient's family history includes Bladder Cancer in an other family member; CAD in his father; Diabetes in his father; Leukemia in an other family member.   ROS:  Please see the history of present illness.      All other systems reviewed and negative.   PHYSICAL EXAM: VS:  BP 90/60  Pulse 62  Ht 5' (1.524 m)  Wt 174 lb (78.926 kg)  BMI 33.98 kg/m2 Well nourished, well developed, in no acute distress HEENT: normal Neck: no JVD Cardiac:  normal S1, S2; irregularly irregular; no murmur Lungs:  clear to auscultation bilaterally, no wheezing, rhonchi or rales Abd: soft, nontender, no hepatomegaly Ext: 2+ pedal edema Skin: warm and dry Neuro:  CNs 2-12 intact, no focal abnormalities noted   ASSESSMENT AND PLAN:  1. ASCAD with no angina 2. HTN - continue diltiazem - stop Diovan HCT and change to just Diovan since he is already on Lasix and BP is running on the low side.  3. Dyslipidemia - continue Zetia/pravastatin  - check FLP and ALT 4. Tachybrady s/p PPM 5. Chronic LE edema - continue Lasix  - check BMET 6. Chronic atrial fibrillation rate controlled - continue Warfarin/diltiazem   Followup with me in 6 months      Signed, Fransico Him, MD 02/02/2014 9:09 AM

## 2014-02-02 NOTE — Patient Instructions (Signed)
Your physician has recommended you make the following change in your medication: 1. Stop Diovan-HCT 2. Start Diovan 320 MG 1 tablet daily  Your physician recommends that you return for lab work on 02/04/14 for FLP,ALT, and BMET (please make sure you are fasting)  Your physician wants you to follow-up in: 6 months with Dr Mallie Snooks will receive a reminder letter in the mail two months in advance. If you don't receive a letter, please call our office to schedule the follow-up appointment.

## 2014-02-04 ENCOUNTER — Other Ambulatory Visit (INDEPENDENT_AMBULATORY_CARE_PROVIDER_SITE_OTHER): Payer: Medicare Other

## 2014-02-04 ENCOUNTER — Ambulatory Visit (INDEPENDENT_AMBULATORY_CARE_PROVIDER_SITE_OTHER): Payer: Medicare Other | Admitting: *Deleted

## 2014-02-04 DIAGNOSIS — I4891 Unspecified atrial fibrillation: Secondary | ICD-10-CM

## 2014-02-04 DIAGNOSIS — I482 Chronic atrial fibrillation, unspecified: Secondary | ICD-10-CM

## 2014-02-04 DIAGNOSIS — E7800 Pure hypercholesterolemia, unspecified: Secondary | ICD-10-CM

## 2014-02-04 DIAGNOSIS — Z5181 Encounter for therapeutic drug level monitoring: Secondary | ICD-10-CM

## 2014-02-04 DIAGNOSIS — I10 Essential (primary) hypertension: Secondary | ICD-10-CM

## 2014-02-04 LAB — LIPID PANEL
CHOL/HDL RATIO: 3
Cholesterol: 128 mg/dL (ref 0–200)
HDL: 37.3 mg/dL — ABNORMAL LOW (ref >39.00)
LDL CALC: 55 mg/dL (ref 0–99)
NonHDL: 90.7
TRIGLYCERIDES: 181 mg/dL — AB (ref 0.0–149.0)
VLDL: 36.2 mg/dL (ref 0.0–40.0)

## 2014-02-04 LAB — ALT: ALT: 14 U/L (ref 0–53)

## 2014-02-04 LAB — BASIC METABOLIC PANEL
BUN: 32 mg/dL — AB (ref 6–23)
CALCIUM: 9.1 mg/dL (ref 8.4–10.5)
CO2: 31 mEq/L (ref 19–32)
CREATININE: 1.2 mg/dL (ref 0.4–1.5)
Chloride: 101 mEq/L (ref 96–112)
GFR: 58.22 mL/min — AB (ref >60.00)
Glucose, Bld: 85 mg/dL (ref 70–99)
Potassium: 3.3 mEq/L — ABNORMAL LOW (ref 3.5–5.1)
Sodium: 140 mEq/L (ref 135–145)

## 2014-02-04 LAB — POCT INR: INR: 2.9

## 2014-02-05 ENCOUNTER — Other Ambulatory Visit: Payer: Self-pay | Admitting: General Surgery

## 2014-02-05 DIAGNOSIS — Z79899 Other long term (current) drug therapy: Secondary | ICD-10-CM

## 2014-02-05 MED ORDER — POTASSIUM CHLORIDE CRYS ER 20 MEQ PO TBCR: EXTENDED_RELEASE_TABLET | ORAL | Status: DC

## 2014-02-08 ENCOUNTER — Other Ambulatory Visit: Payer: Self-pay | Admitting: General Surgery

## 2014-02-08 ENCOUNTER — Encounter: Payer: Self-pay | Admitting: General Surgery

## 2014-02-08 DIAGNOSIS — E7800 Pure hypercholesterolemia, unspecified: Secondary | ICD-10-CM

## 2014-02-12 ENCOUNTER — Other Ambulatory Visit (INDEPENDENT_AMBULATORY_CARE_PROVIDER_SITE_OTHER): Payer: Medicare Other

## 2014-02-12 DIAGNOSIS — Z79899 Other long term (current) drug therapy: Secondary | ICD-10-CM

## 2014-02-12 LAB — BASIC METABOLIC PANEL
BUN: 22 mg/dL (ref 6–23)
CALCIUM: 8.9 mg/dL (ref 8.4–10.5)
CO2: 28 mEq/L (ref 19–32)
Chloride: 106 mEq/L (ref 96–112)
Creatinine, Ser: 1.3 mg/dL (ref 0.4–1.5)
GFR: 57.68 mL/min — ABNORMAL LOW (ref >60.00)
Glucose, Bld: 83 mg/dL (ref 70–99)
POTASSIUM: 4.3 meq/L (ref 3.5–5.1)
SODIUM: 142 meq/L (ref 135–145)

## 2014-02-15 ENCOUNTER — Encounter: Payer: Self-pay | Admitting: General Surgery

## 2014-02-25 ENCOUNTER — Ambulatory Visit (INDEPENDENT_AMBULATORY_CARE_PROVIDER_SITE_OTHER): Payer: Medicare Other | Admitting: *Deleted

## 2014-02-25 DIAGNOSIS — I482 Chronic atrial fibrillation, unspecified: Secondary | ICD-10-CM

## 2014-02-25 DIAGNOSIS — Z5181 Encounter for therapeutic drug level monitoring: Secondary | ICD-10-CM

## 2014-02-25 DIAGNOSIS — I4891 Unspecified atrial fibrillation: Secondary | ICD-10-CM

## 2014-02-25 LAB — POCT INR: INR: 2.3

## 2014-03-10 ENCOUNTER — Telehealth: Payer: Self-pay

## 2014-03-10 MED ORDER — WARFARIN SODIUM 1 MG PO TABS: ORAL_TABLET | ORAL | Status: DC

## 2014-03-10 NOTE — Telephone Encounter (Signed)
Rx sent 

## 2014-03-11 ENCOUNTER — Encounter: Payer: Self-pay | Admitting: Diagnostic Neuroimaging

## 2014-03-11 ENCOUNTER — Encounter (INDEPENDENT_AMBULATORY_CARE_PROVIDER_SITE_OTHER): Payer: Self-pay

## 2014-03-11 ENCOUNTER — Ambulatory Visit (INDEPENDENT_AMBULATORY_CARE_PROVIDER_SITE_OTHER): Payer: Medicare Other | Admitting: Diagnostic Neuroimaging

## 2014-03-11 VITALS — BP 81/53 | HR 62

## 2014-03-11 DIAGNOSIS — G20 Parkinson's disease: Secondary | ICD-10-CM

## 2014-03-11 MED ORDER — CARBIDOPA-LEVODOPA 25-100 MG PO TABS: 2.0000 | ORAL_TABLET | Freq: Three times a day (TID) | ORAL | Status: DC

## 2014-03-11 NOTE — Progress Notes (Signed)
PATIENT: Jerry Wood DOB: June 26, 1981   REASON FOR VISIT: routine follow up for Parkinson's disease HISTORY FROM: patient  HISTORY OF PRESENT ILLNESS:  UPDATE 03/11/14 (VRP): Since last visit, doing better with tremor control on higher carb/levo (2 tabs PO TID). Able to transfer from wheelchair to bed and commode on his own. Walks short distance with walker, but very unsteady.   UPDATE 07/17/13 (LL):  Since last visit, he increased Sinemet to 1.5 tablets and then to 2 tablets TID.   Since being at 2 tablets TID, he states that sometimes it is harder for him to speak; get his words out.  Tremors are still bothersome, and and there is now akathisia with his tongue and jaw. The tremor makes it difficult for him to get to sleep, but when he does sleep, he sleeps well.  Denies dreaming or hallucinations.   UPDATE 01/14/13 (LL): Patient comes in for follow up for Parkinson's Disease. Patient reports that his tremor is worse in both upper extremities, it makes it difficult for him to get to sleep at night. It does not hinder his ability to feed himself. No problem with taste, smell or appetite. Denies constipation, has some urinary urgency. Takes dose of Carb/Levo at 7; 12 and 4. Feels like medication helped at first but now it's not. No side effects from med. He goes to sleep at 10:30 pm. Denies dreaming or hallucinations.   UPDATE 07/15/12 (VRP): Since last visit, carb/levo has helped his tremor. Now able to fall asleep better because tremor is better controlled. No side effects from medications. Satisfied with the results so far.   Prior HPI (03/26/2012) (VRP): 80 year old right-handed male with history of April fibrillation, hypertension, hypothyroidism, hypercholesterolemia, coronary artery disease, here for evaluation of gait difficulty and tremor. Evaluate for possible Parkinson disease. In 2010, patient began to develop intermittent left upper extremity resting tremor. Tremor progressed to more  continuous and bilateral tremor. He does not have significant difficulty when he is doing actions. In addition by 2011 he began to use a cane for walking imbalance. In 2012 he was using a walker. In 2013 he's been using a wheelchair most of the time. He is able to take a few steps with assistance to the bathroom. Of note patient's voice has become more quite over the past 2 years. No constipation, sleep disturbance, anxiety or smell or taste difficulty. His tremors worsened nighttime. No family history of tremor   REVIEW OF SYSTEMS: Full 14 system review of systems performed and notable only for: speech diff tremors joint pain walking diff leg swelling shortness of breath hearing loss SOB double vision.    ALLERGIES: No Known Allergies  HOME MEDICATIONS: Outpatient Prescriptions Prior to Visit  Medication Sig Dispense Refill  . Cholecalciferol (VITAMIN D3) 2000 UNITS TABS Take 2,000 Units by mouth daily.      . clotrimazole-betamethasone (LOTRISONE) cream Apply 1 application topically as needed.      . diltiazem (CARDIZEM CD) 120 MG 24 hr capsule Take 1 capsule (120 mg total) by mouth daily.  90 capsule  3  . ezetimibe (ZETIA) 10 MG tablet TAKE 1 TABLET BY MOUTH EVERY DAY  30 tablet  5  . fexofenadine (ALLEGRA) 60 MG tablet Take 60 mg by mouth as needed for allergies.       . furosemide (LASIX) 80 MG tablet Take 80 mg by mouth daily.      Marland Kitchen levothyroxine (SYNTHROID, LEVOTHROID) 75 MCG tablet Take by mouth daily before  breakfast.       . Multiple Vitamin (MULTIVITAMIN) capsule Take 1 capsule by mouth daily.      . Multiple Vitamins-Minerals (PRESERVISION AREDS) CAPS Take by mouth daily.      . potassium chloride SA (K-DUR,KLOR-CON) 20 MEQ tablet Kdur 2meq take 2 tablets twice daily for 8/21 only.   Then starting 8/22,  take 1 tablet daily  34 tablet  4  . pravastatin (PRAVACHOL) 40 MG tablet TAKE 1 TABLET ONCE A DAY ORALLY  30 tablet  5  . senna-docusate (SENOKOT-S) 8.6-50 MG per tablet Take 1  tablet by mouth daily.      . solifenacin (VESICARE) 10 MG tablet Take 10 mg by mouth as needed.       . vitamin B-12 (CYANOCOBALAMIN) 1000 MCG tablet Take 1,000 mcg by mouth daily.      Marland Kitchen warfarin (COUMADIN) 1 MG tablet 2 tablets on all days except 1 tablet on Mondays or as directed by coumadin clinic  60 tablet  2  . carbidopa-levodopa (SINEMET IR) 25-100 MG per tablet One and one half tab three to four times daily as directed.  180 tablet  6  . valsartan (DIOVAN) 320 MG tablet Take 1 tablet (320 mg total) by mouth daily.  90 tablet  3   No facility-administered medications prior to visit.    PAST MEDICAL HISTORY: Past Medical History  Diagnosis Date  . Hypertension   . COPD, mild     w chronic SOB  . SOB (shortness of breath)     Chronic  . Dyslipidemia   . Chronic knee pain   . Diverticulosis   . Allergic rhinitis   . Gout   . Parkinson's disease 03/2012    Dr Vilinda Blanks started  . Onychomycosis   . Skin lesion     Plan to remove left posterior ear lesion with electrocautery  . Pure hypercholesterolemia   . Vitamin D deficiency   . Unspecified hypothyroidism   . Chronic fatigue   . Osteoarthrosis, unspecified whether generalized or localized, other specified sites     Has gait disorder secondary to DJD of lower extremities  . Gait disorder     Has gait disorder secondary to DJD of lower extremities  . Acute bronchitis   . Benign hypertensive heart disease without heart failure   . Vertigo   . Chronic edema     Chronic mild ankle edema  . Cerumen impaction   . Hypotension, unspecified   . Long term (current) use of anticoagulants   . Urinary urgency   . Seborrheic keratosis, inflamed   . Constipation   . Coronary artery disease     s/p PCI of the RCA  . Permanent atrial fibrillation     Chronic. ECHO 06/04/11 LVEF estimated by 2D at 52.1%  . Tachy-brady syndrome     s/p permanent pacemaker placement    PAST SURGICAL HISTORY: Past Surgical History    Procedure Laterality Date  . Pacemaker placement  2003    Tachybradycardia syndrome. Gen change 07/29/08, Dr. Leonia Reeves  . Laparoscopic partial colectomy      Partial resection of sigmoid colon  . Inguinal hernia repair Bilateral   . Fracture surgery Right     clavicle  . Tympanoplasty Bilateral     FAMILY HISTORY: Family History  Problem Relation Age of Onset  . CAD Father   . Diabetes Father   . Bladder Cancer    . Leukemia      SOCIAL HISTORY:  History   Social History  . Marital Status: Widowed    Spouse Name: N/A    Number of Children: 2  . Years of Education: 7TH   Occupational History  . Not on file.   Social History Main Topics  . Smoking status: Former Smoker -- 10 years    Types: Cigarettes    Quit date: 06/18/1948  . Smokeless tobacco: Never Used  . Alcohol Use: No  . Drug Use: No  . Sexual Activity: Not on file   Other Topics Concern  . Not on file   Social History Narrative   Patient lives at home with family.   Caffeine Use: rarely     PHYSICAL EXAM  Filed Vitals:   03/11/14 1452  BP: 81/53  Pulse: 62   Cannot calculate BMI with a height equal to zero.  Generalized: In no acute distress, pleasant elderly male in wheelchair  Neck: Supple, no carotid bruits  Cardiac: Irregular rate rhythm, no murmur   Neurological examination  Mentation: Alert oriented to time, place, history taking, language fluent, and casual conversation, comprehension intact. POSITIVE MYERSON'S, POSITIVE SNOUT.  Cranial nerve II-XII: Pupils were equal round reactive to light extraocular movements were full, visual field were full on confrontational test. facial sensation and strength were normal. hearing was intact to finger rubbing bilaterally. Uvula tongue midline. head turning and shoulder shrug and were normal and symmetric.Tongue protrusion into cheek strength was normal.   MOTOR: RESTING TREMOR RUE>LUE, NO COGWHEELING. BRADYKINESIA IN BUE and BLE. Normal bulk. BUE  5. BUE (HF 3, KE/KF 3, DF 4). No pronator drift.  SENSORY: normal and symmetric to light touch. ABSENT VIB AT TOES AND ANKLES.  COORDINATION: BUE ACTION TREMOR.  REFLEXES: Deep tendon reflexes in the upper and lower extremities are TRACE and symmetric.  GAIT/STATION: IN WHEEL CHAIR   DIAGNOSTIC DATA (LABS, IMAGING, TESTING)  - I reviewed patient records, labs, notes, testing and imaging myself where available.  CT head without contrast 07/21/2012: Mild chronic small vessel ischemic disease with focal chronic lacunar infarcts in the right basal ganglia and left frontal subcortical regions. Mild diffuse atrophy. No acute findings.    ASSESSMENT AND PLAN  80 y.o. right-handed male with history of Atrial fibrillation, hypertension, hypothyroidism, hypercholesteremia, coronary artery disease, here for evaluation of gait difficulty and tremor. He has 4/4 cardinal signs of Parkinson's disease. Doing better on carb/levo 2 tabs TID.    PLAN:  1. continue carb/levo 2 tabs TID  Return in about 6 months (around 09/09/2014).    Penni Bombard, MD 6/44/0347, 4:25 PM Certified in Neurology, Neurophysiology and Neuroimaging  [facility] [address] [place], [place] [address] [phone]

## 2014-03-11 NOTE — Patient Instructions (Signed)
Continue carbidopa-levodopa 

## 2014-03-25 ENCOUNTER — Ambulatory Visit (INDEPENDENT_AMBULATORY_CARE_PROVIDER_SITE_OTHER): Payer: Medicare Other

## 2014-03-25 DIAGNOSIS — I482 Chronic atrial fibrillation, unspecified: Secondary | ICD-10-CM

## 2014-03-25 DIAGNOSIS — I4891 Unspecified atrial fibrillation: Secondary | ICD-10-CM

## 2014-03-25 DIAGNOSIS — Z5181 Encounter for therapeutic drug level monitoring: Secondary | ICD-10-CM

## 2014-03-25 LAB — POCT INR: INR: 3.4

## 2014-04-01 ENCOUNTER — Encounter: Payer: Self-pay | Admitting: Internal Medicine

## 2014-04-01 DIAGNOSIS — I495 Sick sinus syndrome: Secondary | ICD-10-CM

## 2014-04-15 ENCOUNTER — Ambulatory Visit (INDEPENDENT_AMBULATORY_CARE_PROVIDER_SITE_OTHER): Payer: Medicare Other | Admitting: *Deleted

## 2014-04-15 DIAGNOSIS — Z5181 Encounter for therapeutic drug level monitoring: Secondary | ICD-10-CM

## 2014-04-15 DIAGNOSIS — I4891 Unspecified atrial fibrillation: Secondary | ICD-10-CM

## 2014-04-15 DIAGNOSIS — I482 Chronic atrial fibrillation, unspecified: Secondary | ICD-10-CM

## 2014-04-15 LAB — POCT INR: INR: 2.4

## 2014-04-28 ENCOUNTER — Other Ambulatory Visit: Payer: Self-pay

## 2014-04-28 MED ORDER — EZETIMIBE 10 MG PO TABS: ORAL_TABLET | ORAL | Status: DC

## 2014-05-12 ENCOUNTER — Ambulatory Visit (INDEPENDENT_AMBULATORY_CARE_PROVIDER_SITE_OTHER): Payer: Medicare Other | Admitting: *Deleted

## 2014-05-12 DIAGNOSIS — I4891 Unspecified atrial fibrillation: Secondary | ICD-10-CM

## 2014-05-12 DIAGNOSIS — I482 Chronic atrial fibrillation, unspecified: Secondary | ICD-10-CM

## 2014-05-12 DIAGNOSIS — Z5181 Encounter for therapeutic drug level monitoring: Secondary | ICD-10-CM

## 2014-05-12 LAB — POCT INR: INR: 2.3

## 2014-06-07 ENCOUNTER — Other Ambulatory Visit: Payer: Self-pay | Admitting: Cardiology

## 2014-06-09 ENCOUNTER — Ambulatory Visit (INDEPENDENT_AMBULATORY_CARE_PROVIDER_SITE_OTHER): Payer: Medicare Other | Admitting: *Deleted

## 2014-06-09 DIAGNOSIS — I4891 Unspecified atrial fibrillation: Secondary | ICD-10-CM

## 2014-06-09 DIAGNOSIS — I482 Chronic atrial fibrillation, unspecified: Secondary | ICD-10-CM

## 2014-06-09 DIAGNOSIS — Z5181 Encounter for therapeutic drug level monitoring: Secondary | ICD-10-CM

## 2014-06-09 LAB — POCT INR: INR: 2.5

## 2014-07-01 ENCOUNTER — Encounter: Payer: Self-pay | Admitting: Internal Medicine

## 2014-07-01 DIAGNOSIS — R001 Bradycardia, unspecified: Secondary | ICD-10-CM

## 2014-07-09 ENCOUNTER — Other Ambulatory Visit: Payer: Self-pay | Admitting: Cardiology

## 2014-07-21 ENCOUNTER — Ambulatory Visit (INDEPENDENT_AMBULATORY_CARE_PROVIDER_SITE_OTHER): Payer: Medicare Other | Admitting: Surgery

## 2014-07-21 DIAGNOSIS — I482 Chronic atrial fibrillation, unspecified: Secondary | ICD-10-CM

## 2014-07-21 DIAGNOSIS — I4891 Unspecified atrial fibrillation: Secondary | ICD-10-CM

## 2014-07-21 DIAGNOSIS — Z5181 Encounter for therapeutic drug level monitoring: Secondary | ICD-10-CM

## 2014-07-21 LAB — POCT INR: INR: 2.2

## 2014-07-28 ENCOUNTER — Ambulatory Visit (INDEPENDENT_AMBULATORY_CARE_PROVIDER_SITE_OTHER): Payer: Medicare Other | Admitting: Internal Medicine

## 2014-07-28 ENCOUNTER — Encounter: Payer: Self-pay | Admitting: Internal Medicine

## 2014-07-28 VITALS — BP 110/70 | HR 72 | Ht 62.0 in | Wt 177.2 lb

## 2014-07-28 DIAGNOSIS — I482 Chronic atrial fibrillation, unspecified: Secondary | ICD-10-CM

## 2014-07-28 DIAGNOSIS — I495 Sick sinus syndrome: Secondary | ICD-10-CM

## 2014-07-28 DIAGNOSIS — R609 Edema, unspecified: Secondary | ICD-10-CM

## 2014-07-28 LAB — MDC_IDC_ENUM_SESS_TYPE_INCLINIC
Brady Statistic RA Percent Paced: 0 %
Brady Statistic RV Percent Paced: 41 %
Brady Statistic RV Percent Paced: 41 %
Date Time Interrogation Session: 20160210050000
Implantable Pulse Generator Serial Number: 594044
Implantable Pulse Generator Serial Number: 594044
Lead Channel Impedance Value: 380 Ohm
Lead Channel Pacing Threshold Amplitude: 0.9 V
Lead Channel Pacing Threshold Amplitude: 0.9 V
Lead Channel Pacing Threshold Pulse Width: 0.4 ms
Lead Channel Pacing Threshold Pulse Width: 0.4 ms
Lead Channel Pacing Threshold Pulse Width: 0.4 ms
Lead Channel Pacing Threshold Pulse Width: 0.4 ms
Lead Channel Sensing Intrinsic Amplitude: 6 mV
Lead Channel Setting Pacing Amplitude: 2.4 V
Lead Channel Setting Pacing Amplitude: 2.4 V
Lead Channel Setting Pacing Amplitude: 3.5 V
Lead Channel Setting Pacing Pulse Width: 0.4 ms
Lead Channel Setting Pacing Pulse Width: 0.4 ms
Lead Channel Setting Sensing Sensitivity: 2 mV
Lead Channel Setting Sensing Sensitivity: 2 mV
MDC IDC MSMT LEADCHNL RA IMPEDANCE VALUE: 380 Ohm
MDC IDC MSMT LEADCHNL RA PACING THRESHOLD AMPLITUDE: 0.9 V
MDC IDC MSMT LEADCHNL RV IMPEDANCE VALUE: 320 Ohm
MDC IDC MSMT LEADCHNL RV IMPEDANCE VALUE: 320 Ohm
MDC IDC MSMT LEADCHNL RV PACING THRESHOLD AMPLITUDE: 0.9 V
MDC IDC MSMT LEADCHNL RV SENSING INTR AMPL: 6 mV
MDC IDC SESS DTM: 20160210050000
MDC IDC SET LEADCHNL RA PACING AMPLITUDE: 3.5 V
MDC IDC STAT BRADY RA PERCENT PACED: 0 %

## 2014-07-28 NOTE — Progress Notes (Signed)
Jerry Screws, MD: Primary Cardiologist:  Dr Donnamarie Poag is a 81 y.o. male with a h/o bradycardia/tachycardia syndrome sp PPM (MDT) by Dr Leonia Reeves who presents today to follow-up in the Electrophysiology device clinic.     He is chronically debilitated and cannot walk.  He watches a lot of TV.  There does not appear to be much that he enjoys.   Today, he  denies symptoms of palpitations, chest pain, shortness of breath, orthopnea, PND,   presyncope, syncope, or neurologic sequela. He has stable dizziness/ unsteadiness but without falls.  + chronic edema.  The patientis tolerating medications without difficulties and is otherwise without complaint today.   Past Medical History  Diagnosis Date  . Hypertension   . COPD, mild     w chronic SOB  . SOB (shortness of breath)     Chronic  . Dyslipidemia   . Chronic knee pain   . Diverticulosis   . Allergic rhinitis   . Gout   . Parkinson's disease 03/2012    Dr Vilinda Blanks started  . Onychomycosis   . Skin lesion     Plan to remove left posterior ear lesion with electrocautery  . Pure hypercholesterolemia   . Vitamin D deficiency   . Unspecified hypothyroidism   . Chronic fatigue   . Osteoarthrosis, unspecified whether generalized or localized, other specified sites     Has gait disorder secondary to DJD of lower extremities  . Gait disorder     Has gait disorder secondary to DJD of lower extremities  . Acute bronchitis   . Benign hypertensive heart disease without heart failure   . Vertigo   . Chronic edema     Chronic mild ankle edema  . Cerumen impaction   . Hypotension, unspecified   . Long term (current) use of anticoagulants   . Urinary urgency   . Seborrheic keratosis, inflamed   . Constipation   . Coronary artery disease     s/p PCI of the RCA  . Permanent atrial fibrillation     Chronic. ECHO 06/04/11 LVEF estimated by 2D at 52.1%  . Tachy-brady syndrome     s/p permanent pacemaker placement    Past Surgical History  Procedure Laterality Date  . Pacemaker placement  2003    Tachybradycardia syndrome. Gen change 07/29/08, Dr. Leonia Reeves  . Laparoscopic partial colectomy      Partial resection of sigmoid colon  . Inguinal hernia repair Bilateral   . Fracture surgery Right     clavicle  . Tympanoplasty Bilateral     History   Social History  . Marital Status: Widowed    Spouse Name: N/A  . Number of Children: 2  . Years of Education: 7TH   Occupational History  . Not on file.   Social History Main Topics  . Smoking status: Former Smoker -- 10 years    Types: Cigarettes    Quit date: 06/18/1948  . Smokeless tobacco: Never Used  . Alcohol Use: No  . Drug Use: No  . Sexual Activity: Not on file   Other Topics Concern  . Not on file   Social History Narrative   Patient lives at home with family.   Caffeine Use: rarely    Family History  Problem Relation Age of Onset  . CAD Father   . Diabetes Father   . Bladder Cancer    . Leukemia      No Known Allergies  Current Outpatient Prescriptions  Medication Sig Dispense Refill  . carbidopa-levodopa (SINEMET IR) 25-100 MG per tablet Take 2 tablets by mouth 3 (three) times daily. 540 tablet 4  . Cholecalciferol (VITAMIN D3) 2000 UNITS TABS Take 2,000 Units by mouth daily.    . clotrimazole-betamethasone (LOTRISONE) cream Apply 1 application topically daily as needed (itching).     Marland Kitchen diltiazem (CARDIZEM CD) 120 MG 24 hr capsule Take 1 capsule (120 mg total) by mouth daily. 90 capsule 3  . ezetimibe (ZETIA) 10 MG tablet TAKE 1 TABLET BY MOUTH EVERY DAY 30 tablet 5  . fexofenadine (ALLEGRA) 60 MG tablet Take 60 mg by mouth daily as needed for allergies.     . furosemide (LASIX) 80 MG tablet Take 80 mg by mouth daily.    Marland Kitchen levothyroxine (SYNTHROID, LEVOTHROID) 75 MCG tablet Take 75 mcg by mouth daily before breakfast.     . Multiple Vitamin (MULTIVITAMIN) capsule Take 1 capsule by mouth daily.    . Multiple  Vitamins-Minerals (PRESERVISION AREDS) CAPS Take 1 capsule by mouth daily.     . potassium chloride SA (K-DUR,KLOR-CON) 20 MEQ tablet Kdur 76meq take 2 tablets twice daily for 8/21 only.   Then starting 8/22,  take 1 tablet daily (Patient taking differently: Kdur 54meq take 2 tablets by mouth twice daily for 8/21 only.   Then starting 8/22,  take 1 tablet by mouth daily) 34 tablet 4  . pravastatin (PRAVACHOL) 40 MG tablet TAKE 1 TABLET BY MOUTH DAILY 30 tablet 5  . senna-docusate (SENOKOT-S) 8.6-50 MG per tablet Take 1 tablet by mouth daily.    . solifenacin (VESICARE) 10 MG tablet Take 10 mg by mouth daily as needed (bladder).     . valsartan-hydrochlorothiazide (DIOVAN-HCT) 320-25 MG per tablet Take 1 tablet by mouth daily.    . vitamin B-12 (CYANOCOBALAMIN) 1000 MCG tablet Take 1,000 mcg by mouth daily.    Marland Kitchen warfarin (COUMADIN) 1 MG tablet 2 TABLETS ON ALL DAYS EXCEPT 1 TABLET ON MONDAYS OR AS DIRECTED BY COUMADIN CLINIC 60 tablet 2   No current facility-administered medications for this visit.    ROS- all systems are reviewed and negative except as per HPI.  In addition, poor hearing  Physical Exam: Filed Vitals:   07/28/14 0940  BP: 110/70  Pulse: 72  Height: 5\' 2"  (1.575 m)  Weight: 177 lb 3.2 oz (80.377 kg)    GEN- The patient is elderly appearing, alert and oriented x 3 today.   Head- normocephalic, atraumatic Eyes-  Sclera clear, conjunctiva pink Ears- hearing intact Oropharynx- clear Neck- supple,   Lungs- Clear to ausculation bilaterally, normal work of breathing Chest- pacemaker pocket is well healed Heart- irregular rate and rhythm  GI- soft, NT, ND, + BS Extremities- no clubbing, cyanosis, + dependant edema MS- diffuse muscle atrophy, in a wheelchair today Skin- no rash or lesion Psych- euthymic mood, full affect Neuro-  + prominent resting tremor  Pacemaker interrogation- reviewed in detail today,  See PACEART report  Assessment and Plan:  1. Tachycardia/  bradycardia syndrome Normal pacemaker function See Pace Art report No changes today  2. Permanent afib Rate controlled chads2vasc score is at least 4.  Continue long term anticoagulation  3. Hypotension Stable No changes  4. Venous insufficiency He is not interested in support hose  5. CAD Stable No change required today  TTMs Return to see Chanetta Marshall NP in the device clinic in 1 year

## 2014-07-28 NOTE — Patient Instructions (Addendum)
Your physician wants you to follow-up in:  12 months with Chanetta Marshall, NP You will receive a reminder letter in the mail two months in advance. If you don't receive a letter, please call our office to schedule the follow-up appointment.

## 2014-07-30 ENCOUNTER — Encounter: Payer: Self-pay | Admitting: Cardiology

## 2014-07-30 ENCOUNTER — Ambulatory Visit (INDEPENDENT_AMBULATORY_CARE_PROVIDER_SITE_OTHER): Payer: Medicare Other | Admitting: Cardiology

## 2014-07-30 VITALS — BP 122/80 | HR 116 | Ht 62.0 in | Wt 177.0 lb

## 2014-07-30 DIAGNOSIS — I482 Chronic atrial fibrillation, unspecified: Secondary | ICD-10-CM

## 2014-07-30 DIAGNOSIS — E7800 Pure hypercholesterolemia, unspecified: Secondary | ICD-10-CM

## 2014-07-30 DIAGNOSIS — I2510 Atherosclerotic heart disease of native coronary artery without angina pectoris: Secondary | ICD-10-CM

## 2014-07-30 DIAGNOSIS — I10 Essential (primary) hypertension: Secondary | ICD-10-CM

## 2014-07-30 DIAGNOSIS — Z950 Presence of cardiac pacemaker: Secondary | ICD-10-CM

## 2014-07-30 DIAGNOSIS — I495 Sick sinus syndrome: Secondary | ICD-10-CM

## 2014-07-30 LAB — LIPID PANEL
CHOLESTEROL: 126 mg/dL (ref 0–200)
HDL: 39.4 mg/dL (ref >39.00)
LDL Cholesterol: 54 mg/dL (ref 0–99)
NonHDL: 86.6
Total CHOL/HDL Ratio: 3
Triglycerides: 165 mg/dL — ABNORMAL HIGH (ref 0.0–149.0)
VLDL: 33 mg/dL (ref 0.0–40.0)

## 2014-07-30 LAB — BASIC METABOLIC PANEL
BUN: 24 mg/dL — ABNORMAL HIGH (ref 6–23)
CALCIUM: 8.9 mg/dL (ref 8.4–10.5)
CO2: 32 meq/L (ref 19–32)
Chloride: 105 mEq/L (ref 96–112)
Creatinine, Ser: 1.13 mg/dL (ref 0.40–1.50)
GFR: 64.74 mL/min (ref >60.00)
Glucose, Bld: 89 mg/dL (ref 70–99)
Potassium: 3.9 mEq/L (ref 3.5–5.1)
Sodium: 143 mEq/L (ref 135–145)

## 2014-07-30 LAB — HEPATIC FUNCTION PANEL
ALBUMIN: 3.7 g/dL (ref 3.5–5.2)
ALK PHOS: 114 U/L (ref 39–117)
ALT: 15 U/L (ref 0–53)
AST: 18 U/L (ref 0–37)
BILIRUBIN DIRECT: 0.1 mg/dL (ref 0.0–0.3)
BILIRUBIN TOTAL: 0.8 mg/dL (ref 0.2–1.2)
Total Protein: 6.9 g/dL (ref 6.0–8.3)

## 2014-07-30 NOTE — Progress Notes (Signed)
Cardiology Office Note   Date:  07/30/2014   ID:  Jerry Wood, DOB [birth year], MRN [id number]  PCP:  Henrine Screws, MD  Cardiologist:   Sueanne Margarita, MD   Chief Complaint  Patient presents with  . Coronary Artery Disease  . Hypertension  . Atrial Fibrillation      History of Present Illness: Jerry Wood is a 81 y.o. male with a history of chronic atrial fibrillation, ASCAD, HTN, dyslipidemia and tachybrady syndrome s/p PPM who presents today for followup. He is doing well. He denies any chest pain or SOB.   He denies any dizziness or syncope. He denies any palpitations.  He has chronic LE edema which is stable.   Past Medical History  Diagnosis Date  . Hypertension   . COPD, mild     w chronic SOB  . SOB (shortness of breath)     Chronic  . Dyslipidemia   . Chronic knee pain   . Diverticulosis   . Allergic rhinitis   . Gout   . Parkinson's disease 03/2012    Dr Vilinda Blanks started  . Onychomycosis   . Skin lesion     Plan to remove left posterior ear lesion with electrocautery  . Pure hypercholesterolemia   . Vitamin D deficiency   . Unspecified hypothyroidism   . Chronic fatigue   . Osteoarthrosis, unspecified whether generalized or localized, other specified sites     Has gait disorder secondary to DJD of lower extremities  . Gait disorder     Has gait disorder secondary to DJD of lower extremities  . Acute bronchitis   . Benign hypertensive heart disease without heart failure   . Vertigo   . Chronic edema     Chronic mild ankle edema  . Cerumen impaction   . Hypotension, unspecified   . Long term (current) use of anticoagulants   . Urinary urgency   . Seborrheic keratosis, inflamed   . Constipation   . Coronary artery disease     s/p PCI of the RCA  . Permanent atrial fibrillation     Chronic. ECHO 06/04/11 LVEF estimated by 2D at 52.1%  . Tachy-brady syndrome     s/p permanent pacemaker placement    Past Surgical History    Procedure Laterality Date  . Pacemaker placement  2003    Tachybradycardia syndrome. Gen change 07/29/08, Dr. Leonia Reeves  . Laparoscopic partial colectomy      Partial resection of sigmoid colon  . Inguinal hernia repair Bilateral   . Fracture surgery Right     clavicle  . Tympanoplasty Bilateral      Current Outpatient Prescriptions  Medication Sig Dispense Refill  . carbidopa-levodopa (SINEMET IR) 25-100 MG per tablet Take 2 tablets by mouth 3 (three) times daily. 540 tablet 4  . Cholecalciferol (VITAMIN D3) 2000 UNITS TABS Take 2,000 Units by mouth daily.    . clotrimazole-betamethasone (LOTRISONE) cream Apply 1 application topically daily as needed (itching).     Marland Kitchen diltiazem (CARDIZEM CD) 120 MG 24 hr capsule Take 1 capsule (120 mg total) by mouth daily. 90 capsule 3  . ezetimibe (ZETIA) 10 MG tablet TAKE 1 TABLET BY MOUTH EVERY DAY 30 tablet 5  . fexofenadine (ALLEGRA) 60 MG tablet Take 60 mg by mouth daily as needed for allergies.     . furosemide (LASIX) 80 MG tablet Take 80 mg by mouth daily.    Marland Kitchen levothyroxine (SYNTHROID, LEVOTHROID) 75 MCG tablet Take 75 mcg by mouth daily  before breakfast.     . Multiple Vitamin (MULTIVITAMIN) capsule Take 1 capsule by mouth daily.    . Multiple Vitamins-Minerals (PRESERVISION AREDS) CAPS Take 1 capsule by mouth daily.     . potassium chloride SA (K-DUR,KLOR-CON) 20 MEQ tablet Kdur 65meq take 2 tablets twice daily for 8/21 only.   Then starting 8/22,  take 1 tablet daily (Patient taking differently: Kdur 70meq take 2 tablets by mouth twice daily for 8/21 only.   Then starting 8/22,  take 1 tablet by mouth daily) 34 tablet 4  . pravastatin (PRAVACHOL) 40 MG tablet TAKE 1 TABLET BY MOUTH DAILY 30 tablet 5  . senna-docusate (SENOKOT-S) 8.6-50 MG per tablet Take 1 tablet by mouth daily.    . solifenacin (VESICARE) 10 MG tablet Take 10 mg by mouth daily as needed (bladder).     . valsartan-hydrochlorothiazide (DIOVAN-HCT) 320-25 MG per tablet Take 1  tablet by mouth daily.    . vitamin B-12 (CYANOCOBALAMIN) 1000 MCG tablet Take 1,000 mcg by mouth daily.    Marland Kitchen warfarin (COUMADIN) 1 MG tablet 2 TABLETS ON ALL DAYS EXCEPT 1 TABLET ON MONDAYS OR AS DIRECTED BY COUMADIN CLINIC 60 tablet 2   No current facility-administered medications for this visit.    Allergies:   Review of patient's allergies indicates no known allergies.    Social History:  The patient  reports that he quit smoking about 66 years ago. His smoking use included Cigarettes. He quit after 10 years of use. He has never used smokeless tobacco. He reports that he does not drink alcohol or use illicit drugs.   Family History:  The patient's family history includes Bladder Cancer in an other family member; CAD in his father; Diabetes in his father; Leukemia in an other family member.    ROS:  Please see the history of present illness.   Otherwise, review of systems are positive for none.   All other systems are reviewed and negative.    PHYSICAL EXAM: VS:  BP 122/80 mmHg  Pulse 116  Ht 5\' 2"  (1.575 m)  Wt 177 lb (80.287 kg)  BMI 32.37 kg/m2 , BMI Body mass index is 32.37 kg/(m^2). GEN: Well nourished, well developed, in no acute distress HEENT: normal Neck: no JVD, carotid bruits, or masses Cardiac: RRR; no murmurs, rubs, or gallops,2+  edema  Respiratory:  clear to auscultation bilaterally, normal work of breathing GI: soft, nontender, nondistended, + BS MS: no deformity or atrophy Skin: warm and dry, no rash Neuro:  Strength and sensation are intact Psych: euthymic mood, full affect   EKG:  EKG was ordered today and showed atrial fibrillation with RVR at 116bpm and nonspecific ST abnormality    Recent Labs: 02/04/2014: ALT 14 02/12/2014: BUN 22; Creatinine 1.3; Potassium 4.3; Sodium 142    Lipid Panel    Component Value Date/Time   CHOL 128 02/04/2014 0818   TRIG 181.0* 02/04/2014 0818   HDL 37.30* 02/04/2014 0818   CHOLHDL 3 02/04/2014 0818   VLDL 36.2  02/04/2014 0818   LDLCALC 55 02/04/2014 0818      Wt Readings from Last 3 Encounters:  07/30/14 177 lb (80.287 kg)  07/28/14 177 lb 3.2 oz (80.377 kg)  02/02/14 174 lb (78.926 kg)     ASSESSMENT AND PLAN:  1. ASCAD with no angina.  He is not on ASA due to warfarin. 2. HTN - controlled - continue diltiazem/Diovan HCT 3. Dyslipidemia - continue Zetia/pravastatin  - check FLP and ALT 4. Tachybrady  s/p PPM 5. Chronic LE edema that is 2+ on exam today - he sits all day with his legs hanging down.   - continue Lasix  - check BMET - I will give him a prescription for compression hose 6. Chronic atrial fibrillation rate controlled by pacer Check despite elevated HR this am - continue Warfarin/diltiazem     Current medicines are reviewed at length with the patient today.  The patient does not have concerns regarding medicines.  The following changes have been made:  no change  Labs/ tests ordered today include: BMET and FLP with ALT     Disposition:   FU with me in 6 months   Signed, Sueanne Margarita, MD  07/30/2014 8:50 AM    [facility] [place], [place], [place]  [address] Phone: [phone]; Fax: [phone]

## 2014-07-30 NOTE — Patient Instructions (Signed)
Your physician recommends that you continue on your current medications as directed. Please refer to the Current Medication list given to you today.  Your physician recommends that you have lab work TODAY (BMET, Lipids, LFTs).  Your physician wants you to follow-up in: 6 months with Dr. Radford Pax. You will receive a reminder letter in the mail two months in advance. If you don't receive a letter, please call our office to schedule the follow-up appointment.

## 2014-07-30 NOTE — Addendum Note (Signed)
Addended by: Sueanne Margarita on: 07/30/2014 02:38 PM   Modules accepted: Miquel Dunn

## 2014-08-02 ENCOUNTER — Telehealth: Payer: Self-pay | Admitting: *Deleted

## 2014-08-02 NOTE — Telephone Encounter (Signed)
-----   Message from Sueanne Margarita, MD sent at 07/30/2014  3:03 PM EST ----- Stable labs - continue current meds

## 2014-08-02 NOTE — Telephone Encounter (Signed)
Called to let Patient know that lab results were normal and to continue normal therapy

## 2014-08-03 ENCOUNTER — Encounter: Payer: Self-pay | Admitting: Internal Medicine

## 2014-09-01 ENCOUNTER — Ambulatory Visit (INDEPENDENT_AMBULATORY_CARE_PROVIDER_SITE_OTHER): Payer: Medicare Other | Admitting: *Deleted

## 2014-09-01 DIAGNOSIS — I4891 Unspecified atrial fibrillation: Secondary | ICD-10-CM

## 2014-09-01 DIAGNOSIS — Z5181 Encounter for therapeutic drug level monitoring: Secondary | ICD-10-CM

## 2014-09-01 DIAGNOSIS — I482 Chronic atrial fibrillation, unspecified: Secondary | ICD-10-CM

## 2014-09-01 LAB — POCT INR: INR: 2.5

## 2014-09-07 ENCOUNTER — Other Ambulatory Visit: Payer: Self-pay | Admitting: Cardiology

## 2014-09-09 ENCOUNTER — Encounter: Payer: Self-pay | Admitting: Diagnostic Neuroimaging

## 2014-09-09 ENCOUNTER — Ambulatory Visit (INDEPENDENT_AMBULATORY_CARE_PROVIDER_SITE_OTHER): Payer: Medicare Other | Admitting: Diagnostic Neuroimaging

## 2014-09-09 VITALS — BP 104/62 | HR 65 | Ht 62.0 in

## 2014-09-09 DIAGNOSIS — G20 Parkinson's disease: Secondary | ICD-10-CM | POA: Diagnosis not present

## 2014-09-09 MED ORDER — CARBIDOPA-LEVODOPA 25-100 MG PO TABS: 2.0000 | ORAL_TABLET | Freq: Three times a day (TID) | ORAL | Status: DC

## 2014-09-09 NOTE — Patient Instructions (Signed)
Continue current medications. 

## 2014-09-09 NOTE — Progress Notes (Signed)
PATIENT: Jerry Wood DOB: July 12, 1921   REASON FOR VISIT: routine follow up for Parkinson's disease HISTORY FROM: patient  Chief Complaint  Patient presents with  . Follow-up    Parkinson's Disease      HISTORY OF PRESENT ILLNESS:  UPDATE 09/09/14: Since last visit, doing well. Tremor stable. Notes some internal shaking sensation (abdomen) without anxiety, SOB or CP. Tolerating carb/levo 25/100 2 tabs TID.   UPDATE 03/11/14 (VRP): Since last visit, doing better with tremor control on higher carb/levo (2 tabs PO TID). Able to transfer from wheelchair to bed and commode on his own. Walks short distance with walker, but very unsteady.   UPDATE 07/17/13 (LL):  Since last visit, he increased Sinemet to 1.5 tablets and then to 2 tablets TID.   Since being at 2 tablets TID, he states that sometimes it is harder for him to speak; get his words out.  Tremors are still bothersome, and and there is now akathisia with his tongue and jaw. The tremor makes it difficult for him to get to sleep, but when he does sleep, he sleeps well.  Denies dreaming or hallucinations.   UPDATE 01/14/13 (LL): Patient comes in for follow up for Parkinson's Disease. Patient reports that his tremor is worse in both upper extremities, it makes it difficult for him to get to sleep at night. It does not hinder his ability to feed himself. No problem with taste, smell or appetite. Denies constipation, has some urinary urgency. Takes dose of Carb/Levo at 7; 12 and 4. Feels like medication helped at first but now it's not. No side effects from med. He goes to sleep at 10:30 pm. Denies dreaming or hallucinations.   UPDATE 07/15/12 (VRP): Since last visit, carb/levo has helped his tremor. Now able to fall asleep better because tremor is better controlled. No side effects from medications. Satisfied with the results so far.   Prior HPI (03/26/2012) (VRP): 81 year old right-handed male with history of April fibrillation,  hypertension, hypothyroidism, hypercholesterolemia, coronary artery disease, here for evaluation of gait difficulty and tremor. Evaluate for possible Parkinson disease. In 2010, patient began to develop intermittent left upper extremity resting tremor. Tremor progressed to more continuous and bilateral tremor. He does not have significant difficulty when he is doing actions. In addition by 2011 he began to use a cane for walking imbalance. In 2012 he was using a walker. In 2013 he's been using a wheelchair most of the time. He is able to take a few steps with assistance to the bathroom. Of note patient's voice has become more quite over the past 2 years. No constipation, sleep disturbance, anxiety or smell or taste difficulty. His tremors worsened nighttime. No family history of tremor.   REVIEW OF SYSTEMS: Full 14 system review of systems performed and notable only for: speech diff tremors joint pain walking diff leg swelling shortness of breath hearing loss SOB double vision.    ALLERGIES: No Known Allergies  HOME MEDICATIONS: Outpatient Prescriptions Prior to Visit  Medication Sig Dispense Refill  . Cholecalciferol (VITAMIN D3) 2000 UNITS TABS Take 2,000 Units by mouth daily.    . clotrimazole-betamethasone (LOTRISONE) cream Apply 1 application topically daily as needed (itching).     Marland Kitchen diltiazem (CARDIZEM CD) 120 MG 24 hr capsule Take 1 capsule (120 mg total) by mouth daily. 90 capsule 3  . ezetimibe (ZETIA) 10 MG tablet TAKE 1 TABLET BY MOUTH EVERY DAY 30 tablet 5  . fexofenadine (ALLEGRA) 60 MG tablet Take 60  mg by mouth daily as needed for allergies.     . furosemide (LASIX) 80 MG tablet Take 80 mg by mouth daily.    Marland Kitchen levothyroxine (SYNTHROID, LEVOTHROID) 75 MCG tablet Take 75 mcg by mouth daily before breakfast.     . Multiple Vitamin (MULTIVITAMIN) capsule Take 1 capsule by mouth daily.    . Multiple Vitamins-Minerals (PRESERVISION AREDS) CAPS Take 1 capsule by mouth daily.     .  potassium chloride SA (K-DUR,KLOR-CON) 20 MEQ tablet Kdur 47meq take 2 tablets twice daily for 8/21 only.   Then starting 8/22,  take 1 tablet daily (Patient taking differently: Kdur 58meq take 2 tablets by mouth twice daily for 8/21 only.   Then starting 8/22,  take 1 tablet by mouth daily) 34 tablet 4  . pravastatin (PRAVACHOL) 40 MG tablet TAKE 1 TABLET BY MOUTH DAILY 30 tablet 5  . valsartan-hydrochlorothiazide (DIOVAN-HCT) 320-25 MG per tablet Take 1 tablet by mouth daily.    . vitamin B-12 (CYANOCOBALAMIN) 1000 MCG tablet Take 1,000 mcg by mouth daily.    Marland Kitchen warfarin (COUMADIN) 1 MG tablet 2 TABLETS ON ALL DAYS EXCEPT 1 TABLET ON MONDAYS OR AS DIRECTED BY COUMADIN CLINIC 60 tablet 2  . carbidopa-levodopa (SINEMET IR) 25-100 MG per tablet Take 2 tablets by mouth 3 (three) times daily. 540 tablet 4  . senna-docusate (SENOKOT-S) 8.6-50 MG per tablet Take 1 tablet by mouth daily.    . solifenacin (VESICARE) 10 MG tablet Take 10 mg by mouth daily as needed (bladder).      No facility-administered medications prior to visit.    PAST MEDICAL HISTORY: Past Medical History  Diagnosis Date  . Hypertension   . COPD, mild     w chronic SOB  . SOB (shortness of breath)     Chronic  . Dyslipidemia   . Chronic knee pain   . Diverticulosis   . Allergic rhinitis   . Gout   . Parkinson's disease 03/2012    Dr Vilinda Blanks started  . Onychomycosis   . Skin lesion     Plan to remove left posterior ear lesion with electrocautery  . Pure hypercholesterolemia   . Vitamin D deficiency   . Unspecified hypothyroidism   . Chronic fatigue   . Osteoarthrosis, unspecified whether generalized or localized, other specified sites     Has gait disorder secondary to DJD of lower extremities  . Gait disorder     Has gait disorder secondary to DJD of lower extremities  . Acute bronchitis   . Benign hypertensive heart disease without heart failure   . Vertigo   . Chronic edema     Chronic mild ankle  edema  . Cerumen impaction   . Hypotension, unspecified   . Long term (current) use of anticoagulants   . Urinary urgency   . Seborrheic keratosis, inflamed   . Constipation   . Coronary artery disease     s/p PCI of the RCA  . Permanent atrial fibrillation     Chronic. ECHO 06/04/11 LVEF estimated by 2D at 52.1%  . Tachy-brady syndrome     s/p permanent pacemaker placement    PAST SURGICAL HISTORY: Past Surgical History  Procedure Laterality Date  . Pacemaker placement  2003    Tachybradycardia syndrome. Gen change 07/29/08, Dr. Leonia Reeves  . Laparoscopic partial colectomy      Partial resection of sigmoid colon  . Inguinal hernia repair Bilateral   . Fracture surgery Right     clavicle  .  Tympanoplasty Bilateral     FAMILY HISTORY: Family History  Problem Relation Age of Onset  . CAD Father   . Diabetes Father   . Bladder Cancer    . Leukemia      SOCIAL HISTORY: History   Social History  . Marital Status: Widowed    Spouse Name: N/A  . Number of Children: 2  . Years of Education: 7TH   Occupational History  . Not on file.   Social History Main Topics  . Smoking status: Former Smoker -- 10 years    Types: Cigarettes    Quit date: 06/18/1948  . Smokeless tobacco: Never Used  . Alcohol Use: No  . Drug Use: No  . Sexual Activity: Not on file   Other Topics Concern  . Not on file   Social History Narrative   Patient lives at home with family.   Caffeine Use: rarely     PHYSICAL EXAM  Filed Vitals:   09/09/14 1402  BP: 104/62  Pulse: 65  Height: 5\' 2"  (1.575 m)   Body mass index is 0.00 kg/(m^2).  Generalized: In no acute distress, pleasant elderly male in wheelchair  Neck: Supple, no carotid bruits  Cardiac: Irregular rate rhythm, no murmur   Neurological examination  Mentation: Awake and alert, language fluent, comprehension intact. POSITIVE MYERSON'S, POSITIVE SNOUT.  Cranial nerve II-XII: Pupils were equal round reactive to light  extraocular movements were full, visual field were full on confrontational test. facial sensation and strength were normal. hearing was intact to finger rubbing bilaterally. Uvula tongue midline. head turning and shoulder shrug and were normal and symmetric.Tongue protrusion into cheek strength was normal.   MOTOR: RESTING TREMOR RUE>LUE, NO COGWHEELING. BRADYKINESIA IN BUE and BLE. Normal bulk. BUE 5. BUE (HF 2-3, KE/KF 3, DF 4). No pronator drift.  SENSORY: normal and symmetric to light touch. ABSENT VIB AT TOES AND ANKLES.  COORDINATION: BUE ACTION TREMOR.  REFLEXES: Deep tendon reflexes in the upper and lower extremities are TRACE and symmetric.  GAIT/STATION: IN WHEELCHAIR   DIAGNOSTIC DATA (LABS, IMAGING, TESTING)  - I reviewed patient records, labs, notes, testing and imaging myself where available.  CT head without contrast 07/21/2012: Mild chronic small vessel ischemic disease with focal chronic lacunar infarcts in the right basal ganglia and left frontal subcortical regions. Mild diffuse atrophy. No acute findings.    ASSESSMENT AND PLAN  82 y.o. right-handed male with history of atrial fibrillation, hypertension, hypothyroidism, hypercholesteremia, coronary artery disease, here for evaluation of gait difficulty and tremor. He has 4/4 cardinal signs of Parkinson's disease. Stable on carb/levo 2 tabs TID.    PLAN:  - continue carb/levo 2 tabs TID - fall risk precautions reviewed  Return in about 6 months (around 03/12/2015).    Penni Bombard, MD 5/68/1275, 1:70 PM Certified in Neurology, Neurophysiology and Neuroimaging  [facility] [address] [place], [place] [address] [phone]

## 2014-09-30 ENCOUNTER — Encounter: Payer: Self-pay | Admitting: Internal Medicine

## 2014-09-30 DIAGNOSIS — R001 Bradycardia, unspecified: Secondary | ICD-10-CM | POA: Diagnosis not present

## 2014-10-14 ENCOUNTER — Ambulatory Visit (INDEPENDENT_AMBULATORY_CARE_PROVIDER_SITE_OTHER): Payer: Medicare Other | Admitting: *Deleted

## 2014-10-14 DIAGNOSIS — Z5181 Encounter for therapeutic drug level monitoring: Secondary | ICD-10-CM

## 2014-10-14 DIAGNOSIS — I482 Chronic atrial fibrillation, unspecified: Secondary | ICD-10-CM

## 2014-10-14 DIAGNOSIS — I4891 Unspecified atrial fibrillation: Secondary | ICD-10-CM

## 2014-10-14 LAB — POCT INR: INR: 2.3

## 2014-11-05 ENCOUNTER — Other Ambulatory Visit: Payer: Self-pay

## 2014-11-05 ENCOUNTER — Encounter: Payer: Self-pay | Admitting: Cardiology

## 2014-11-05 MED ORDER — EZETIMIBE 10 MG PO TABS: ORAL_TABLET | ORAL | Status: DC

## 2014-11-25 ENCOUNTER — Ambulatory Visit (INDEPENDENT_AMBULATORY_CARE_PROVIDER_SITE_OTHER): Payer: Medicare Other | Admitting: *Deleted

## 2014-11-25 DIAGNOSIS — I4891 Unspecified atrial fibrillation: Secondary | ICD-10-CM

## 2014-11-25 DIAGNOSIS — I482 Chronic atrial fibrillation, unspecified: Secondary | ICD-10-CM

## 2014-11-25 DIAGNOSIS — Z5181 Encounter for therapeutic drug level monitoring: Secondary | ICD-10-CM

## 2014-11-25 LAB — POCT INR: INR: 1.9

## 2014-12-01 ENCOUNTER — Other Ambulatory Visit: Payer: Self-pay | Admitting: Cardiology

## 2014-12-04 ENCOUNTER — Other Ambulatory Visit: Payer: Self-pay | Admitting: Cardiology

## 2014-12-09 ENCOUNTER — Other Ambulatory Visit: Payer: Self-pay | Admitting: Cardiology

## 2014-12-29 ENCOUNTER — Ambulatory Visit (INDEPENDENT_AMBULATORY_CARE_PROVIDER_SITE_OTHER): Payer: Medicare Other | Admitting: *Deleted

## 2014-12-29 DIAGNOSIS — I482 Chronic atrial fibrillation, unspecified: Secondary | ICD-10-CM

## 2014-12-29 DIAGNOSIS — I4891 Unspecified atrial fibrillation: Secondary | ICD-10-CM | POA: Diagnosis not present

## 2014-12-29 DIAGNOSIS — Z5181 Encounter for therapeutic drug level monitoring: Secondary | ICD-10-CM

## 2014-12-29 LAB — POCT INR: INR: 2.2

## 2014-12-30 DIAGNOSIS — I495 Sick sinus syndrome: Secondary | ICD-10-CM | POA: Diagnosis not present

## 2015-01-03 ENCOUNTER — Other Ambulatory Visit: Payer: Self-pay | Admitting: Cardiology

## 2015-01-26 ENCOUNTER — Ambulatory Visit (INDEPENDENT_AMBULATORY_CARE_PROVIDER_SITE_OTHER): Payer: Medicare Other | Admitting: Cardiology

## 2015-01-26 ENCOUNTER — Encounter: Payer: Self-pay | Admitting: *Deleted

## 2015-01-26 ENCOUNTER — Ambulatory Visit (INDEPENDENT_AMBULATORY_CARE_PROVIDER_SITE_OTHER)
Admission: RE | Admit: 2015-01-26 | Discharge: 2015-01-26 | Disposition: A | Discharge status: Home / Self Care | Payer: Medicare Other | Source: Ambulatory Visit | Attending: Cardiology | Admitting: Cardiology

## 2015-01-26 ENCOUNTER — Encounter: Payer: Self-pay | Admitting: Cardiology

## 2015-01-26 VITALS — BP 118/82 | HR 83 | Ht 62.0 in | Wt 172.1 lb

## 2015-01-26 DIAGNOSIS — I10 Essential (primary) hypertension: Secondary | ICD-10-CM

## 2015-01-26 DIAGNOSIS — I2510 Atherosclerotic heart disease of native coronary artery without angina pectoris: Secondary | ICD-10-CM

## 2015-01-26 DIAGNOSIS — I495 Sick sinus syndrome: Secondary | ICD-10-CM | POA: Diagnosis not present

## 2015-01-26 DIAGNOSIS — E7800 Pure hypercholesterolemia, unspecified: Secondary | ICD-10-CM

## 2015-01-26 DIAGNOSIS — R0602 Shortness of breath: Secondary | ICD-10-CM

## 2015-01-26 DIAGNOSIS — I482 Chronic atrial fibrillation, unspecified: Secondary | ICD-10-CM

## 2015-01-26 LAB — LIPID PANEL
CHOL/HDL RATIO: 3
Cholesterol: 128 mg/dL (ref 0–200)
HDL: 39.6 mg/dL (ref >39.00)
LDL Cholesterol: 62 mg/dL (ref 0–99)
NONHDL: 87.91
Triglycerides: 128 mg/dL (ref 0.0–149.0)
VLDL: 25.6 mg/dL (ref 0.0–40.0)

## 2015-01-26 LAB — HEPATIC FUNCTION PANEL
ALK PHOS: 117 U/L (ref 39–117)
ALT: 9 U/L (ref 0–53)
AST: 15 U/L (ref 0–37)
Albumin: 3.9 g/dL (ref 3.5–5.2)
BILIRUBIN DIRECT: 0.2 mg/dL (ref 0.0–0.3)
TOTAL PROTEIN: 7.1 g/dL (ref 6.0–8.3)
Total Bilirubin: 0.8 mg/dL (ref 0.2–1.2)

## 2015-01-26 LAB — BASIC METABOLIC PANEL
BUN: 24 mg/dL — ABNORMAL HIGH (ref 6–23)
CO2: 30 meq/L (ref 19–32)
CREATININE: 1.09 mg/dL (ref 0.40–1.50)
Calcium: 9.7 mg/dL (ref 8.4–10.5)
Chloride: 105 mEq/L (ref 96–112)
GFR: 67.42 mL/min (ref >60.00)
Glucose, Bld: 91 mg/dL (ref 70–99)
Potassium: 4.7 mEq/L (ref 3.5–5.1)
SODIUM: 142 meq/L (ref 135–145)

## 2015-01-26 LAB — BRAIN NATRIURETIC PEPTIDE: PRO B NATRI PEPTIDE: 169 pg/mL — AB (ref 0.0–100.0)

## 2015-01-26 LAB — D-DIMER, QUANTITATIVE: D-Dimer, Quant: 0.39 ug/mL-FEU (ref 0.00–0.48)

## 2015-01-26 NOTE — Telephone Encounter (Signed)
This encounter was created in error - please disregard.

## 2015-01-26 NOTE — Patient Instructions (Signed)
Medication Instructions:  Your physician recommends that you continue on your current medications as directed. Please refer to the Current Medication list given to you today.   Labwork: TODAY: BMET, LFTs, BNP, Lipids, DDimer  Testing/Procedures: Your physician has requested that you have an echocardiogram. Echocardiography is a painless test that uses sound waves to create images of your heart. It provides your doctor with information about the size and shape of your heart and how well your heart's chambers and valves are working. This procedure takes approximately one hour. There are no restrictions for this procedure.   Dr. Radford Pax recommends you have a chest x-ray.  Follow-Up: Your physician wants you to follow-up in: 6 months with Dr. Radford Pax. You will receive a reminder letter in the mail two months in advance. If you don't receive a letter, please call our office to schedule the follow-up appointment.   Any Other Special Instructions Will Be Listed Below (If Applicable).

## 2015-01-26 NOTE — Progress Notes (Signed)
Cardiology Office Note   Date:  01/26/2015   ID:  Jerry Wood, DOB Nov 16, 1921, MRN [id number]  PCP:  Jerry Screws, MD    Chief Complaint  Patient presents with  . Follow-up    chronic atrial fib      History of Present Illness: Jerry Wood is a 81 y.o. male with a history of chronic atrial fibrillation, ASCAD, HTN, dyslipidemia and tachybrady syndrome s/p PPM who presents today for followup. He is doing well. He denies any chest pain. He denies any dizziness or syncope. He denies any palpitations. He has chronic LE edema which is stable when using compression hose.  Over the past few days he has had some DOE but denies any fever or chills.  He has had a mild nonproductive cough.     Past Medical History  Diagnosis Date  . Hypertension   . COPD, mild     w chronic SOB  . SOB (shortness of breath)     Chronic  . Dyslipidemia   . Chronic knee pain   . Diverticulosis   . Allergic rhinitis   . Gout   . Parkinson's disease 03/2012    Dr Vilinda Blanks started  . Onychomycosis   . Skin lesion     Plan to remove left posterior ear lesion with electrocautery  . Pure hypercholesterolemia   . Vitamin D deficiency   . Unspecified hypothyroidism   . Chronic fatigue   . Osteoarthrosis, unspecified whether generalized or localized, other specified sites     Has gait disorder secondary to DJD of lower extremities  . Gait disorder     Has gait disorder secondary to DJD of lower extremities  . Acute bronchitis   . Benign hypertensive heart disease without heart failure   . Vertigo   . Chronic edema     Chronic mild ankle edema  . Cerumen impaction   . Hypotension, unspecified   . Long term (current) use of anticoagulants   . Urinary urgency   . Seborrheic keratosis, inflamed   . Constipation   . Coronary artery disease     s/p PCI of the RCA  . Permanent atrial fibrillation     Chronic. ECHO 06/04/11 LVEF estimated by 2D at 52.1%  .  Tachy-brady syndrome     s/p permanent pacemaker placement    Past Surgical History  Procedure Laterality Date  . Pacemaker placement  2003    Tachybradycardia syndrome. Gen change 07/29/08, Dr. Leonia Reeves  . Laparoscopic partial colectomy      Partial resection of sigmoid colon  . Inguinal hernia repair Bilateral   . Fracture surgery Right     clavicle  . Tympanoplasty Bilateral      Current Outpatient Prescriptions  Medication Sig Dispense Refill  . carbidopa-levodopa (SINEMET IR) 25-100 MG per tablet Take 2 tablets by mouth 3 (three) times daily. 540 tablet 4  . Cholecalciferol (VITAMIN D3) 2000 UNITS TABS Take 2,000 Units by mouth daily.    . clotrimazole-betamethasone (LOTRISONE) cream Apply 1 application topically daily as needed (itching).     Marland Kitchen diltiazem (CARDIZEM CD) 120 MG 24 hr capsule TAKE 1 CAPSULE (120 MG TOTAL) BY MOUTH DAILY. 90 capsule 0  . ezetimibe (ZETIA) 10 MG tablet TAKE 1 TABLET BY MOUTH EVERY DAY 30 tablet 5  . fexofenadine (ALLEGRA) 60 MG tablet Take 60 mg by mouth daily as needed for allergies.     Marland Kitchen  furosemide (LASIX) 80 MG tablet Take 80 mg by mouth daily.    Marland Kitchen levothyroxine (SYNTHROID, LEVOTHROID) 75 MCG tablet Take 75 mcg by mouth daily before breakfast.     . Multiple Vitamin (MULTIVITAMIN) capsule Take 1 capsule by mouth daily.    . Multiple Vitamins-Minerals (PRESERVISION AREDS) CAPS Take 1 capsule by mouth daily.     . potassium chloride SA (K-DUR,KLOR-CON) 20 MEQ tablet Kdur 23meq take 2 tablets twice daily for 8/21 only.   Then starting 8/22,  take 1 tablet daily (Patient taking differently: Take 20 mEq by mouth 2 (two) times daily. Kdur 82meq take 2 tablets twice daily for 8/21 only.   Then starting 8/22,  take 1 tablet daily) 34 tablet 4  . pravastatin (PRAVACHOL) 40 MG tablet TAKE 1 TABLET BY MOUTH DAILY 30 tablet 3  . valsartan-hydrochlorothiazide (DIOVAN-HCT) 320-25 MG per tablet Take 1 tablet by mouth daily.    . vitamin B-12 (CYANOCOBALAMIN) 1000  MCG tablet Take 1,000 mcg by mouth daily.    Marland Kitchen warfarin (COUMADIN) 1 MG tablet Take as directed by [facility] 60 tablet 3   No current facility-administered medications for this visit.    Allergies:   Review of patient's allergies indicates no known allergies.    Social History:  The patient  reports that he quit smoking about 66 years ago. His smoking use included Cigarettes. He quit after 10 years of use. He has never used smokeless tobacco. He reports that he does not drink alcohol or use illicit drugs.   Family History:  The patient's family history includes Bladder Cancer in an other family member; CAD in his father; Diabetes in his father; Leukemia in an other family member.    ROS:  Please see the history of present illness.   Otherwise, review of systems are positive for none.   All other systems are reviewed and negative.    PHYSICAL EXAM: VS:  BP 118/82 mmHg  Pulse 83  Ht 5\' 2"  (1.575 m)  Wt 172 lb 1.9 oz (78.073 kg)  BMI 31.47 kg/m2  SpO2 95% , BMI Body mass index is 31.47 kg/(m^2). GEN: Well nourished, well developed, in no acute distress HEENT: normal Neck: no JVD, carotid bruits, or masses Cardiac: irregularly irregular; no murmurs, rubs, or gallops.  1+ edema Respiratory:  clear to auscultation bilaterally, normal work of breathing GI: soft, nontender, nondistended, + BS MS: no deformity or atrophy Skin: warm and dry, no rash Neuro:  Strength and sensation are intact Psych: euthymic mood, full affect   EKG:  EKG is not ordered today.    Recent Labs: 07/30/2014: ALT 15; BUN 24*; Creatinine, Ser 1.13; Potassium 3.9; Sodium 143    Lipid Panel    Component Value Date/Time   CHOL 126 07/30/2014 0926   TRIG 165.0* 07/30/2014 0926   HDL 39.40 07/30/2014 0926   CHOLHDL 3 07/30/2014 0926   VLDL 33.0 07/30/2014 0926   LDLCALC 54 07/30/2014 0926      Wt Readings from Last 3 Encounters:  01/26/15 172 lb 1.9 oz (78.073 kg)  07/30/14 177 lb (80.287 kg)    07/28/14 177 lb 3.2 oz (80.377 kg)    ASSESSMENT AND PLAN:  1. ASCAD with no angina. He is not on ASA due to warfarin. 2. HTN - controlled - continue diltiazem/Diovan HCT 3. Dyslipidemia - continue Zetia/pravastatin  - check FLP and ALT 4. Tachybrady s/p PPM       5.   Chronic LE edema that is 1+  on exam today - continue Lasix  - check BMET 6. Chronic atrial fibrillation rate controlled by pacer Check despite elevated HR this am - continue Warfarin/diltiazem        7.  SOB of unclear etiology.  His weight is down from last OV.  His lungs are clear.  i will check a 2D echo to assess LVF and get a BNP.  I will check a chest xray.         Current medicines are reviewed at length with the patient today.  The patient does not have concerns regarding medicines.  The following changes have been made:  no change  Labs/ tests ordered today: See above Assessment and Plan No orders of the defined types were placed in this encounter.     Disposition:   FU with me in 6 months  Signed, Sueanne Margarita, MD  01/26/2015 8:16 AM    [facility] [address], [place], [place]  [address] Phone: [phone]; Fax: [phone]

## 2015-01-27 ENCOUNTER — Encounter: Payer: Self-pay | Admitting: Cardiology

## 2015-01-27 NOTE — Telephone Encounter (Signed)
This encounter was created in error - please disregard.

## 2015-01-27 NOTE — Telephone Encounter (Signed)
F/U  Pt returning call to [facility]- lab results

## 2015-01-31 ENCOUNTER — Other Ambulatory Visit: Payer: Self-pay | Admitting: Cardiology

## 2015-02-01 ENCOUNTER — Ambulatory Visit (HOSPITAL_COMMUNITY): Payer: Medicare Other | Attending: Cardiology

## 2015-02-01 ENCOUNTER — Other Ambulatory Visit: Payer: Self-pay

## 2015-02-01 DIAGNOSIS — I340 Nonrheumatic mitral (valve) insufficiency: Secondary | ICD-10-CM | POA: Insufficient documentation

## 2015-02-01 DIAGNOSIS — R0602 Shortness of breath: Secondary | ICD-10-CM

## 2015-02-01 DIAGNOSIS — I10 Essential (primary) hypertension: Secondary | ICD-10-CM | POA: Insufficient documentation

## 2015-02-01 DIAGNOSIS — I517 Cardiomegaly: Secondary | ICD-10-CM | POA: Insufficient documentation

## 2015-02-01 DIAGNOSIS — E785 Hyperlipidemia, unspecified: Secondary | ICD-10-CM | POA: Insufficient documentation

## 2015-02-03 ENCOUNTER — Telehealth: Payer: Self-pay | Admitting: *Deleted

## 2015-02-03 NOTE — Telephone Encounter (Signed)
DPR on file for daughter [name] who cb said her dad did not quite understand results. I gave results to [facility] in detail as I did w/pt. Pt did ask for [place] to have Dawn as main conatct for results etc. I said I will note that in the chart.

## 2015-02-03 NOTE — Telephone Encounter (Signed)
Pt notified of echo results by phone with verbal understanding to findings on echo. Pt said ok and thank you for our help.

## 2015-02-09 ENCOUNTER — Ambulatory Visit (INDEPENDENT_AMBULATORY_CARE_PROVIDER_SITE_OTHER): Payer: Medicare Other | Admitting: *Deleted

## 2015-02-09 ENCOUNTER — Other Ambulatory Visit: Payer: Medicare Other

## 2015-02-09 DIAGNOSIS — I482 Chronic atrial fibrillation, unspecified: Secondary | ICD-10-CM

## 2015-02-09 DIAGNOSIS — Z5181 Encounter for therapeutic drug level monitoring: Secondary | ICD-10-CM

## 2015-02-09 DIAGNOSIS — I4891 Unspecified atrial fibrillation: Secondary | ICD-10-CM | POA: Diagnosis not present

## 2015-02-09 LAB — POCT INR: INR: 2.6

## 2015-03-15 ENCOUNTER — Encounter: Payer: Self-pay | Admitting: Diagnostic Neuroimaging

## 2015-03-15 ENCOUNTER — Ambulatory Visit (INDEPENDENT_AMBULATORY_CARE_PROVIDER_SITE_OTHER): Payer: Medicare Other | Admitting: Diagnostic Neuroimaging

## 2015-03-15 VITALS — BP 117/74 | HR 82 | Ht 62.0 in | Wt 171.0 lb

## 2015-03-15 DIAGNOSIS — G20 Parkinson's disease: Secondary | ICD-10-CM

## 2015-03-15 MED ORDER — CARBIDOPA-LEVODOPA 25-100 MG PO TABS: 2.0000 | ORAL_TABLET | Freq: Three times a day (TID) | ORAL | Status: DC

## 2015-03-15 NOTE — Progress Notes (Signed)
PATIENT: Jerry Wood DOB: 13-Feb-1922   REASON FOR VISIT: routine follow up for Parkinson's disease HISTORY FROM: patient and daughter Jerry Wood)  Chief Complaint  Patient presents with  . Parkinson's Disease    rm 7     HISTORY OF PRESENT ILLNESS:  UPDATE 03/15/15: Since last visit, patient doing well. No falls. Tolerating meds. Some swallow issues with larger pills. Doing ok with foods.  UPDATE 09/09/14: Since last visit, doing well. Tremor stable. Notes some internal shaking sensation (abdomen) without anxiety, SOB or CP. Tolerating carb/levo 25/100 2 tabs TID.   UPDATE 03/11/14 (VRP): Since last visit, doing better with tremor control on higher carb/levo (2 tabs PO TID). Able to transfer from wheelchair to bed and commode on his own. Walks short distance with walker, but very unsteady.   UPDATE 07/17/13 (LL):  Since last visit, he increased Sinemet to 1.5 tablets and then to 2 tablets TID.   Since being at 2 tablets TID, he states that sometimes it is harder for him to speak; get his words out.  Tremors are still bothersome, and and there is now akathisia with his tongue and jaw. The tremor makes it difficult for him to get to sleep, but when he does sleep, he sleeps well.  Denies dreaming or hallucinations.   UPDATE 01/14/13 (LL): Patient comes in for follow up for Parkinson's Disease. Patient reports that his tremor is worse in both upper extremities, it makes it difficult for him to get to sleep at night. It does not hinder his ability to feed himself. No problem with taste, smell or appetite. Denies constipation, has some urinary urgency. Takes dose of Carb/Levo at 7; 12 and 4. Feels like medication helped at first but now it's not. No side effects from med. He goes to sleep at 10:30 pm. Denies dreaming or hallucinations.   UPDATE 07/15/12 (VRP): Since last visit, carb/levo has helped his tremor. Now able to fall asleep better because tremor is better controlled. No side effects from  medications. Satisfied with the results so far.   Prior HPI (03/26/2012) (VRP): 81 year old right-handed male with history of April fibrillation, hypertension, hypothyroidism, hypercholesterolemia, coronary artery disease, here for evaluation of gait difficulty and tremor. Evaluate for possible Parkinson disease. In 2010, patient began to develop intermittent left upper extremity resting tremor. Tremor progressed to more continuous and bilateral tremor. He does not have significant difficulty when he is doing actions. In addition by 2011 he began to use a cane for walking imbalance. In 2012 he was using a walker. In 2013 he's been using a wheelchair most of the time. He is able to take a few steps with assistance to the bathroom. Of note patient's voice has become more quite over the past 2 years. No constipation, sleep disturbance, anxiety or smell or taste difficulty. His tremors worsened nighttime. No family history of tremor.   REVIEW OF SYSTEMS: Full 14 system review of systems performed and notable only for: joint pain walking diff leg swelling shortness of breath hearing loss SOB snoring dizziness.    ALLERGIES: No Known Allergies  HOME MEDICATIONS: Outpatient Prescriptions Prior to Visit  Medication Sig Dispense Refill  . Cholecalciferol (VITAMIN D3) 2000 UNITS TABS Take 2,000 Units by mouth daily.    . clotrimazole-betamethasone (LOTRISONE) cream Apply 1 application topically daily as needed (itching).     Marland Kitchen diltiazem (CARDIZEM CD) 120 MG 24 hr capsule TAKE 1 CAPSULE (120 MG TOTAL) BY MOUTH DAILY. 90 capsule 0  . ezetimibe (  ZETIA) 10 MG tablet TAKE 1 TABLET BY MOUTH EVERY DAY 30 tablet 5  . fexofenadine (ALLEGRA) 60 MG tablet Take 60 mg by mouth daily as needed for allergies.     . furosemide (LASIX) 80 MG tablet Take 80 mg by mouth daily.    Marland Kitchen levothyroxine (SYNTHROID, LEVOTHROID) 75 MCG tablet Take 75 mcg by mouth daily before breakfast.     . Multiple Vitamin (MULTIVITAMIN) capsule  Take 1 capsule by mouth daily.    . Multiple Vitamins-Minerals (PRESERVISION AREDS) CAPS Take 1 capsule by mouth daily.     . potassium chloride SA (K-DUR,KLOR-CON) 20 MEQ tablet Kdur 46meq take 2 tablets twice daily for 8/21 only.   Then starting 8/22,  take 1 tablet daily (Patient taking differently: Take 20 mEq by mouth 2 (two) times daily. Kdur 52meq take 2 tablets twice daily for 8/21 only.   Then starting 8/22,  take 1 tablet daily) 34 tablet 4  . pravastatin (PRAVACHOL) 40 MG tablet TAKE 1 TABLET BY MOUTH DAILY 30 tablet 3  . valsartan (DIOVAN) 320 MG tablet TAKE 1 TABLET (320 MG TOTAL) BY MOUTH DAILY. 90 tablet 3  . valsartan-hydrochlorothiazide (DIOVAN-HCT) 320-25 MG per tablet Take 1 tablet by mouth daily.    . vitamin B-12 (CYANOCOBALAMIN) 1000 MCG tablet Take 1,000 mcg by mouth daily.    Marland Kitchen warfarin (COUMADIN) 1 MG tablet Take as directed by [facility] 60 tablet 3  . carbidopa-levodopa (SINEMET IR) 25-100 MG per tablet Take 2 tablets by mouth 3 (three) times daily. 540 tablet 4   No facility-administered medications prior to visit.    PAST MEDICAL HISTORY: Past Medical History  Diagnosis Date  . Hypertension   . COPD, mild     w chronic SOB  . SOB (shortness of breath)     Chronic  . Dyslipidemia   . Chronic knee pain   . Diverticulosis   . Allergic rhinitis   . Gout   . Parkinson's disease 03/2012    Dr Vilinda Blanks started  . Onychomycosis   . Skin lesion     Plan to remove left posterior ear lesion with electrocautery  . Pure hypercholesterolemia   . Vitamin D deficiency   . Unspecified hypothyroidism   . Chronic fatigue   . Osteoarthrosis, unspecified whether generalized or localized, other specified sites     Has gait disorder secondary to DJD of lower extremities  . Gait disorder     Has gait disorder secondary to DJD of lower extremities  . Acute bronchitis   . Benign hypertensive heart disease without heart failure   . Vertigo   . Chronic edema       Chronic mild ankle edema  . Cerumen impaction   . Hypotension, unspecified   . Long term (current) use of anticoagulants   . Urinary urgency   . Seborrheic keratosis, inflamed   . Constipation   . Coronary artery disease     s/p PCI of the RCA  . Permanent atrial fibrillation     Chronic. ECHO 06/04/11 LVEF estimated by 2D at 52.1%  . Tachy-brady syndrome     s/p permanent pacemaker placement    PAST SURGICAL HISTORY: Past Surgical History  Procedure Laterality Date  . Pacemaker placement  2003    Tachybradycardia syndrome. Gen change 07/29/08, Dr. Leonia Reeves  . Laparoscopic partial colectomy      Partial resection of sigmoid colon  . Inguinal hernia repair Bilateral   . Fracture surgery Right  clavicle  . Tympanoplasty Bilateral     FAMILY HISTORY: Family History  Problem Relation Age of Onset  . CAD Father   . Diabetes Father   . Bladder Cancer    . Leukemia      SOCIAL HISTORY: Social History   Social History  . Marital Status: Widowed    Spouse Name: N/A  . Number of Children: 2  . Years of Education: 7TH   Occupational History  . Not on file.   Social History Main Topics  . Smoking status: Former Smoker -- 10 years    Types: Cigarettes    Quit date: 06/18/1948  . Smokeless tobacco: Never Used  . Alcohol Use: No  . Drug Use: No  . Sexual Activity: Not on file   Other Topics Concern  . Not on file   Social History Narrative   Patient lives at home with family.   Caffeine Use: rarely     PHYSICAL EXAM  Filed Vitals:   03/15/15 0817  BP: 117/74  Pulse: 82  Height: 5\' 2"  (1.575 m)  Weight: 171 lb (77.565 kg)   Body mass index is 31.27 kg/(m^2).  Generalized: In no acute distress, pleasant elderly male in wheelchair  Neck: Supple, no carotid bruits  Cardiac: Irregular rate rhythm, no murmur   Neurological examination  Mentation: Awake and alert, language fluent, comprehension intact. POSITIVE MYERSON'S, POSITIVE SNOUT.  Cranial  nerve II-XII: Pupils were equal round reactive to light extraocular movements were full, visual field were full on confrontational test. facial sensation and strength were normal. hearing was intact to finger rubbing bilaterally. Uvula tongue midline. head turning and shoulder shrug and were normal and symmetric.Tongue protrusion into cheek strength was normal.   MOTOR: NO RESTING TREMOR, NO COGWHEELING. BRADYKINESIA IN BUE and BLE. Normal bulk. BUE 5. BLE (HF 2-3, KE/KF 3, DF 4). No pronator drift.  SENSORY: normal and symmetric to light touch. ABSENT VIB AT TOES AND ANKLES.  COORDINATION: BUE ACTION TREMOR.  REFLEXES: Deep tendon reflexes in the upper and lower extremities are TRACE and symmetric.  GAIT/STATION: IN WHEELCHAIR   DIAGNOSTIC DATA (LABS, IMAGING, TESTING)  - I reviewed patient records, labs, notes, testing and imaging myself where available.  CT head without contrast 07/21/2012: Mild chronic small vessel ischemic disease with focal chronic lacunar infarcts in the right basal ganglia and left frontal subcortical regions. Mild diffuse atrophy. No acute findings.    ASSESSMENT AND PLAN  81 y.o. right-handed male with history of atrial fibrillation, hypertension, hypothyroidism, hypercholesteremia, coronary artery disease, here for evaluation of gait difficulty and tremor. He has 4/4 cardinal signs of Parkinson's disease. Stable on carb/levo 2 tabs TID.    PLAN:  I spent 15 minutes of face to face time with patient. Greater than 50% of time was spent in counseling and coordination of care with patient. In summary we discussed:  - continue carb/levo 2 tabs TID - fall risk precautions reviewed  Return in about 6 months (around 09/12/2015).    Penni Bombard, MD 07/18/8655, 8:46 AM Certified in Neurology, Neurophysiology and Neuroimaging  [facility] [address], [place], [place] [address] [phone]

## 2015-03-15 NOTE — Patient Instructions (Signed)
Thank you for coming to see [place] at [facility]. I hope we have been able to provide you high quality care today.  You may receive a patient satisfaction survey over the next few weeks. We would appreciate your feedback and comments so that we may continue to improve ourselves and the health of our patients.  - continue current medications   ~~~~~~~~~~~~~~~~~~~~~~~~~~~~~~~~~~~~~~~~~~~~~~~~~~~~~~~~~~~~~~~~~  DR. PENUMALLI'S GUIDE TO HAPPY AND HEALTHY LIVING These are some of my general health and wellness recommendations. Some of them may apply to you better than others. Please use common sense as you try these suggestions and feel free to ask me any questions.   ACTIVITY/FITNESS Mental, social, emotional and physical stimulation are very important for brain and body health. Try learning a new activity (arts, music, language, sports, games).  Keep moving your body to the best of your abilities.    NUTRITION Eat more plants: colorful vegetables, nuts, seeds and berries.  Eat less sugar, salt, preservatives and processed foods.  Avoid toxins such as cigarettes and alcohol.  Drink water when you are thirsty. Warm water with a slice of lemon is an excellent morning drink to start the day.   RELAXATION Consider practicing mindfulness meditation or other relaxation techniques such as deep breathing, prayer, yoga, tai chi, massage. See website mindful.org or the apps Headspace or Calm to help get started.   SLEEP Try to get at least 7-8+ hours sleep per day. Regular exercise and reduced caffeine will help you sleep better. Practice good sleep hygeine techniques. See website sleep.org for more information.   PLANNING Prepare estate planning, living will, healthcare POA documents. Sometimes this is best planned with the help of an attorney. Theconversationproject.org and agingwithdignity.org are excellent resources.

## 2015-03-16 ENCOUNTER — Other Ambulatory Visit: Payer: Self-pay | Admitting: Cardiology

## 2015-03-23 ENCOUNTER — Ambulatory Visit (INDEPENDENT_AMBULATORY_CARE_PROVIDER_SITE_OTHER): Payer: Medicare Other | Admitting: *Deleted

## 2015-03-23 DIAGNOSIS — I4891 Unspecified atrial fibrillation: Secondary | ICD-10-CM | POA: Diagnosis not present

## 2015-03-23 DIAGNOSIS — I482 Chronic atrial fibrillation, unspecified: Secondary | ICD-10-CM

## 2015-03-23 DIAGNOSIS — Z5181 Encounter for therapeutic drug level monitoring: Secondary | ICD-10-CM

## 2015-03-23 LAB — POCT INR: INR: 1.7

## 2015-03-29 ENCOUNTER — Other Ambulatory Visit: Payer: Self-pay | Admitting: Cardiology

## 2015-03-29 NOTE — Telephone Encounter (Signed)
Please review for refill, Thank you. 

## 2015-03-31 DIAGNOSIS — I495 Sick sinus syndrome: Secondary | ICD-10-CM | POA: Diagnosis not present

## 2015-04-20 ENCOUNTER — Ambulatory Visit (INDEPENDENT_AMBULATORY_CARE_PROVIDER_SITE_OTHER): Payer: Medicare Other | Admitting: *Deleted

## 2015-04-20 DIAGNOSIS — I482 Chronic atrial fibrillation, unspecified: Secondary | ICD-10-CM

## 2015-04-20 DIAGNOSIS — Z5181 Encounter for therapeutic drug level monitoring: Secondary | ICD-10-CM

## 2015-04-20 DIAGNOSIS — Z23 Encounter for immunization: Secondary | ICD-10-CM

## 2015-04-20 DIAGNOSIS — I4891 Unspecified atrial fibrillation: Secondary | ICD-10-CM

## 2015-04-20 LAB — POCT INR: INR: 2.3

## 2015-04-30 ENCOUNTER — Other Ambulatory Visit: Payer: Self-pay | Admitting: Cardiology

## 2015-05-19 ENCOUNTER — Encounter: Payer: Self-pay | Admitting: Cardiology

## 2015-05-19 ENCOUNTER — Ambulatory Visit (INDEPENDENT_AMBULATORY_CARE_PROVIDER_SITE_OTHER): Payer: Medicare Other | Admitting: *Deleted

## 2015-05-19 DIAGNOSIS — I482 Chronic atrial fibrillation, unspecified: Secondary | ICD-10-CM

## 2015-05-19 DIAGNOSIS — I4891 Unspecified atrial fibrillation: Secondary | ICD-10-CM | POA: Diagnosis not present

## 2015-05-19 DIAGNOSIS — Z5181 Encounter for therapeutic drug level monitoring: Secondary | ICD-10-CM

## 2015-05-19 LAB — POCT INR: INR: 2.2

## 2015-06-23 ENCOUNTER — Ambulatory Visit (INDEPENDENT_AMBULATORY_CARE_PROVIDER_SITE_OTHER): Payer: Medicare Other | Admitting: *Deleted

## 2015-06-23 DIAGNOSIS — I482 Chronic atrial fibrillation, unspecified: Secondary | ICD-10-CM

## 2015-06-23 DIAGNOSIS — I4891 Unspecified atrial fibrillation: Secondary | ICD-10-CM

## 2015-06-23 DIAGNOSIS — Z5181 Encounter for therapeutic drug level monitoring: Secondary | ICD-10-CM

## 2015-06-23 LAB — POCT INR: INR: 2.2

## 2015-06-24 ENCOUNTER — Other Ambulatory Visit: Payer: Self-pay | Admitting: Cardiology

## 2015-07-20 DIAGNOSIS — C44319 Basal cell carcinoma of skin of other parts of face: Secondary | ICD-10-CM | POA: Diagnosis not present

## 2015-07-20 DIAGNOSIS — C4441 Basal cell carcinoma of skin of scalp and neck: Secondary | ICD-10-CM | POA: Diagnosis not present

## 2015-08-01 DIAGNOSIS — I495 Sick sinus syndrome: Secondary | ICD-10-CM | POA: Diagnosis not present

## 2015-08-04 ENCOUNTER — Ambulatory Visit (INDEPENDENT_AMBULATORY_CARE_PROVIDER_SITE_OTHER): Payer: Medicare Other | Admitting: *Deleted

## 2015-08-04 DIAGNOSIS — I4891 Unspecified atrial fibrillation: Secondary | ICD-10-CM

## 2015-08-04 DIAGNOSIS — Z5181 Encounter for therapeutic drug level monitoring: Secondary | ICD-10-CM

## 2015-08-04 DIAGNOSIS — I482 Chronic atrial fibrillation, unspecified: Secondary | ICD-10-CM

## 2015-08-04 LAB — POCT INR: INR: 2.2

## 2015-08-15 ENCOUNTER — Encounter: Payer: Self-pay | Admitting: Nurse Practitioner

## 2015-08-15 NOTE — Progress Notes (Signed)
Electrophysiology Office Note Date: 08/16/2015  ID:  Jerry Wood, DOB 1921-11-08, MRN [id number]  PCP: Henrine Screws, MD Primary Cardiologist: Radford Pax Electrophysiologist: Allred  CC: Pacemaker follow-up  Jerry Wood is a 82 y.o. male seen today for Dr Rayann Heman.  He presents today for routine electrophysiology followup.  Since last being seen in our clinic, the patient reports doing reasonably well. He is primarily wheelchair bound. He denies chest pain, palpitations, dyspnea, PND, orthopnea, nausea, vomiting, dizziness, syncope, weight gain, or early satiety.  He has chronic LE edema that has been helped with compression hose. He has had no bleeding complications on Warfarin.   Device History: BSX dual chamber PPM implanted 2003 for tachy-brady syndrome; gen change 2010   Past Medical History  Diagnosis Date  . Hypertension   . COPD, mild ([place])   . Dyslipidemia   . Chronic knee pain   . Diverticulosis   . Allergic rhinitis   . Gout   . Parkinson's disease ([place]) 03/2012    Dr Vilinda Blanks started  . Onychomycosis   . Skin lesion     Plan to remove left posterior ear lesion with electrocautery  . Pure hypercholesterolemia   . Vitamin D deficiency   . Unspecified hypothyroidism   . Osteoarthrosis, unspecified whether generalized or localized, other specified sites     Has gait disorder secondary to DJD of lower extremities  . Benign hypertensive heart disease without heart failure   . Vertigo   . Long term (current) use of anticoagulants   . Seborrheic keratosis, inflamed   . Constipation   . Coronary artery disease     s/p PCI of the RCA  . Permanent atrial fibrillation (HCC)     Chronic. ECHO 06/04/11 LVEF estimated by 2D at 52.1%  . Tachy-brady syndrome [facility])     s/p permanent pacemaker placement   Past Surgical History  Procedure Laterality Date  . Pacemaker placement  2003    Tachybradycardia syndrome. Gen change 07/29/08, Dr. Leonia Reeves  .  Laparoscopic partial colectomy      Partial resection of sigmoid colon  . Inguinal hernia repair Bilateral   . Fracture surgery Right     clavicle  . Tympanoplasty Bilateral     Current Outpatient Prescriptions  Medication Sig Dispense Refill  . carbidopa-levodopa (SINEMET IR) 25-100 MG per tablet Take 2 tablets by mouth 3 (three) times daily. 540 tablet 4  . Cholecalciferol (VITAMIN D3) 2000 UNITS TABS Take 2,000 Units by mouth daily.    . clotrimazole-betamethasone (LOTRISONE) cream Apply 1 application topically daily as needed (itching).     Marland Kitchen diltiazem (CARDIZEM CD) 120 MG 24 hr capsule TAKE 1 CAPSULE BY MOUTH EVERY DAY 90 capsule 1  . fexofenadine (ALLEGRA) 60 MG tablet Take 60 mg by mouth daily as needed for allergies.     . furosemide (LASIX) 80 MG tablet Take 80 mg by mouth daily.    Marland Kitchen levothyroxine (SYNTHROID, LEVOTHROID) 75 MCG tablet Take 75 mcg by mouth daily before breakfast.     . Multiple Vitamin (MULTIVITAMIN) capsule Take 1 capsule by mouth daily.    . Multiple Vitamins-Minerals (PRESERVISION AREDS) CAPS Take 1 capsule by mouth daily.     . potassium chloride SA (K-DUR,KLOR-CON) 20 MEQ tablet Take 30 mEq by mouth daily.    . pravastatin (PRAVACHOL) 40 MG tablet TAKE 1 TABLET BY MOUTH DAILY 30 tablet 5  . valsartan (DIOVAN) 320 MG tablet TAKE 1 TABLET (320 MG TOTAL) BY MOUTH DAILY. Julian  tablet 3  . valsartan-hydrochlorothiazide (DIOVAN-HCT) 320-25 MG per tablet Take 1 tablet by mouth daily.    . vitamin B-12 (CYANOCOBALAMIN) 1000 MCG tablet Take 1,000 mcg by mouth daily.    Marland Kitchen warfarin (COUMADIN) 1 MG tablet TAKE AS DIRECTED BY COUMADIN CLINIC 60 tablet 3  . ZETIA 10 MG tablet TAKE 1 TABLET BY MOUTH EVERY DAY 30 tablet 5   No current facility-administered medications for this visit.    Allergies:   Review of patient's allergies indicates no known allergies.   Social History: Social History   Social History  . Marital Status: Widowed    Spouse Name: N/A  . Number of  Children: 2  . Years of Education: 7TH   Occupational History  . Not on file.   Social History Main Topics  . Smoking status: Former Smoker -- 10 years    Types: Cigarettes    Quit date: 06/18/1948  . Smokeless tobacco: Never Used  . Alcohol Use: No  . Drug Use: No  . Sexual Activity: Not on file   Other Topics Concern  . Not on file   Social History Narrative   Patient lives at home with family.   Caffeine Use: rarely    Family History: Family History  Problem Relation Age of Onset  . CAD Father   . Diabetes Father   . Bladder Cancer    . Leukemia       Review of Systems: All other systems reviewed and are otherwise negative except as noted above.   Physical Exam: VS:  BP 114/78 mmHg  Pulse 92  Ht 5\' 2"  (1.575 m)  Wt 170 lb (77.111 kg)  BMI 31.09 kg/m2 , BMI Body mass index is 31.09 kg/(m^2).  GEN- The patient is elderly appearing, alert and oriented x 3 today.   HEENT: normocephalic, atraumatic; sclera clear, conjunctiva pink; hearing intact; oropharynx clear; neck supple  Lungs- Decreased BS throughout, normal work of breathing.    Heart- Irregular rate and rhythm, no murmurs, rubs or gallops, PMI not laterally displaced GI- soft, non-tender, non-distended, bowel sounds present  Extremities- no clubbing, cyanosis, + bilateral dependent edema  MS- no significant deformity or atrophy Skin- warm and dry, no rash or lesion; PPM pocket well healed Psych- euthymic mood, full affect Neuro- strength and sensation are intact  PPM Interrogation- reviewed in detail today,  See PACEART report  EKG:  EKG is ordered today. The ekg ordered today shows atrial fibrillation with intermittent ventricular pacing  Recent Labs: 01/26/2015: ALT 9; BUN 24*; Creatinine, Ser 1.09; Potassium 4.7; Pro B Natriuretic peptide (BNP) 169.0*; Sodium 142   Wt Readings from Last 3 Encounters:  08/16/15 170 lb (77.111 kg)  03/15/15 171 lb (77.565 kg)  01/26/15 172 lb 1.9 oz (78.073 kg)      Other studies Reviewed: Additional studies/ records that were reviewed today include: Dr Rayann Heman and Dr Theodosia Blender office notes  Assessment and Plan:  1.  Tachy/brady syndrome Normal PPM function See Pace Art report No changes today  2.  Permanent atrial fibrillation V rates controlled by device interrogation today Continue Warfarin for CHADS2VAC of 4  3.  CAD No recent ischemic symptoms  4.  Hypotension Stable No change required today  Current medicines are reviewed at length with the patient today.   The patient does not have concerns regarding his medicines.  The following changes were made today:  none  Labs/ tests ordered today include: none   Disposition:   Follow up with  TTM's with [facility], Dr Radford Pax 6 months with device check at that time, Dr Rayann Heman or me in 1 year    Signed, Chanetta Marshall, NP 08/16/2015 8:47 AM  [place] [address] [place]  [address] [phone] (office) [phone] (fax)

## 2015-08-16 ENCOUNTER — Ambulatory Visit: Payer: Medicare Other | Admitting: Physician Assistant

## 2015-08-16 ENCOUNTER — Encounter: Payer: Self-pay | Admitting: Internal Medicine

## 2015-08-16 ENCOUNTER — Ambulatory Visit (INDEPENDENT_AMBULATORY_CARE_PROVIDER_SITE_OTHER): Payer: Medicare Other | Admitting: Nurse Practitioner

## 2015-08-16 ENCOUNTER — Encounter: Payer: Self-pay | Admitting: Nurse Practitioner

## 2015-08-16 VITALS — BP 114/78 | HR 92 | Ht 62.0 in | Wt 170.0 lb

## 2015-08-16 DIAGNOSIS — I495 Sick sinus syndrome: Secondary | ICD-10-CM | POA: Diagnosis not present

## 2015-08-16 DIAGNOSIS — I951 Orthostatic hypotension: Secondary | ICD-10-CM | POA: Diagnosis not present

## 2015-08-16 DIAGNOSIS — I4821 Permanent atrial fibrillation: Secondary | ICD-10-CM

## 2015-08-16 DIAGNOSIS — I482 Chronic atrial fibrillation: Secondary | ICD-10-CM

## 2015-08-16 LAB — CUP PACEART INCLINIC DEVICE CHECK
Date Time Interrogation Session: 20170302071245
Implantable Lead Implant Date: 20100211
Implantable Lead Location: 753860
Implantable Lead Model: 4456
Implantable Lead Model: 4470
Lead Channel Setting Pacing Amplitude: 2.4 V
Lead Channel Setting Pacing Pulse Width: 0.4 ms
Lead Channel Setting Sensing Sensitivity: 2 mV
MDC IDC LEAD IMPLANT DT: 20100211
MDC IDC LEAD LOCATION: 753859
MDC IDC LEAD SERIAL: 313924
MDC IDC LEAD SERIAL: 330954
MDC IDC PG SERIAL: 594044
MDC IDC SET LEADCHNL RA PACING AMPLITUDE: 3.5 V

## 2015-08-16 NOTE — Patient Instructions (Signed)
Medication Instructions:   Your physician recommends that you continue on your current medications as directed. Please refer to the Current Medication list given to you today.   If you need a refill on your cardiac medications before your next appointment, please call your pharmacy.  Labwork:  NONE ORDER TODAY    Testing/Procedures: NONE ORDER TODAY    Follow-Up:   Your physician wants you to follow-up in: [place] will receive a reminder letter in the mail two months in advance. If you don't receive a letter, please call our office to schedule the follow-up appointment.  Your physician wants you to follow-up in:  IN 6  MONTHS WITH DR Heron Nay  You will receive a reminder letter in the mail two months in advance. If you don't receive a letter, please call our office to schedule the follow-up appointment.  Any Other Special Instructions Will Be Listed Below (If Applicable).

## 2015-08-19 ENCOUNTER — Encounter: Payer: Medicare Other | Admitting: Nurse Practitioner

## 2015-09-12 ENCOUNTER — Encounter: Payer: Self-pay | Admitting: Diagnostic Neuroimaging

## 2015-09-12 ENCOUNTER — Ambulatory Visit (INDEPENDENT_AMBULATORY_CARE_PROVIDER_SITE_OTHER): Payer: Medicare Other | Admitting: Diagnostic Neuroimaging

## 2015-09-12 VITALS — BP 106/67 | HR 65 | Ht 62.0 in | Wt 173.0 lb

## 2015-09-12 DIAGNOSIS — R269 Unspecified abnormalities of gait and mobility: Secondary | ICD-10-CM | POA: Diagnosis not present

## 2015-09-12 DIAGNOSIS — G20 Parkinson's disease: Secondary | ICD-10-CM

## 2015-09-12 DIAGNOSIS — R1310 Dysphagia, unspecified: Secondary | ICD-10-CM

## 2015-09-12 MED ORDER — CARBIDOPA-LEVODOPA 25-100 MG PO TABS: 2.0000 | ORAL_TABLET | Freq: Three times a day (TID) | ORAL | Status: DC

## 2015-09-12 NOTE — Patient Instructions (Signed)
-  continue current medications

## 2015-09-12 NOTE — Progress Notes (Signed)
PATIENT: Jerry Wood DOB: November 27, 1921   REASON FOR VISIT: follow up for Parkinson's disease HISTORY FROM: patient and daughter Jerry Wood)  Chief Complaint  Patient presents with  . Parkinson Disease    rm 7, daughter-  Jerry Wood, "nothing has changed since last OV"  . Follow-up    6 month     HISTORY OF PRESENT ILLNESS:  UPDATE 09/12/15: Since last visit, doing about the same to slightly progressing with PD. No falls. Mainly in wheelchair. Uses cane occ to transfer, but needs help. Daughter helps with ADLs.   UPDATE 03/15/15: Since last visit, patient doing well. No falls. Tolerating meds. Some swallow issues with larger pills. Doing ok with foods.  UPDATE 09/09/14: Since last visit, doing well. Tremor stable. Notes some internal shaking sensation (abdomen) without anxiety, SOB or CP. Tolerating carb/levo 25/100 2 tabs TID.   UPDATE 03/11/14 (VRP): Since last visit, doing better with tremor control on higher carb/levo (2 tabs PO TID). Able to transfer from wheelchair to bed and commode on his own. Walks short distance with walker, but very unsteady.   UPDATE 07/17/13 (LL):  Since last visit, he increased Sinemet to 1.5 tablets and then to 2 tablets TID.   Since being at 2 tablets TID, he states that sometimes it is harder for him to speak; get his words out.  Tremors are still bothersome, and and there is now akathisia with his tongue and jaw. The tremor makes it difficult for him to get to sleep, but when he does sleep, he sleeps well.  Denies dreaming or hallucinations.   UPDATE 01/14/13 (LL): Patient comes in for follow up for Parkinson's Disease. Patient reports that his tremor is worse in both upper extremities, it makes it difficult for him to get to sleep at night. It does not hinder his ability to feed himself. No problem with taste, smell or appetite. Denies constipation, has some urinary urgency. Takes dose of Carb/Levo at 7; 12 and 4. Feels like medication helped at first but now it's  not. No side effects from med. He goes to sleep at 10:30 pm. Denies dreaming or hallucinations.   UPDATE 07/15/12 (VRP): Since last visit, carb/levo has helped his tremor. Now able to fall asleep better because tremor is better controlled. No side effects from medications. Satisfied with the results so far.   Prior HPI (03/26/2012) (VRP): 82 year old right-handed male with history of atrial fibrillation, hypertension, hypothyroidism, hypercholesterolemia, coronary artery disease, here for evaluation of gait difficulty and tremor. Evaluate for possible Parkinson disease. In 2010, patient began to develop intermittent left upper extremity resting tremor. Tremor progressed to more continuous and bilateral tremor. He does not have significant difficulty when he is doing actions. In addition by 2011 he began to use a cane for walking imbalance. In 2012 he was using a walker. In 2013 he's been using a wheelchair most of the time. He is able to take a few steps with assistance to the bathroom. Of note patient's voice has become more quite over the past 2 years. No constipation, sleep disturbance, anxiety or smell or taste difficulty. His tremors worsened nighttime. No family history of tremor.   REVIEW OF SYSTEMS: Full 14 system review of systems performed and notable only for: joint pain walking diff leg swelling shortness of breath hearing loss SOB snoring dizziness.    ALLERGIES: No Known Allergies  HOME MEDICATIONS: Outpatient Prescriptions Prior to Visit  Medication Sig Dispense Refill  . carbidopa-levodopa (SINEMET IR) 25-100 MG per  tablet Take 2 tablets by mouth 3 (three) times daily. 540 tablet 4  . Cholecalciferol (VITAMIN D3) 2000 UNITS TABS Take 2,000 Units by mouth daily.    . clotrimazole-betamethasone (LOTRISONE) cream Apply 1 application topically daily as needed (itching).     Marland Kitchen diltiazem (CARDIZEM CD) 120 MG 24 hr capsule TAKE 1 CAPSULE BY MOUTH EVERY DAY 90 capsule 1  . fexofenadine  (ALLEGRA) 60 MG tablet Take 60 mg by mouth daily as needed for allergies.     . furosemide (LASIX) 80 MG tablet Take 80 mg by mouth daily.    Marland Kitchen levothyroxine (SYNTHROID, LEVOTHROID) 75 MCG tablet Take 75 mcg by mouth daily before breakfast.     . Multiple Vitamin (MULTIVITAMIN) capsule Take 1 capsule by mouth daily.    . Multiple Vitamins-Minerals (PRESERVISION AREDS) CAPS Take 1 capsule by mouth daily.     . potassium chloride SA (K-DUR,KLOR-CON) 20 MEQ tablet Take 30 mEq by mouth daily.    . pravastatin (PRAVACHOL) 40 MG tablet TAKE 1 TABLET BY MOUTH DAILY 30 tablet 5  . valsartan (DIOVAN) 320 MG tablet TAKE 1 TABLET (320 MG TOTAL) BY MOUTH DAILY. 90 tablet 3  . valsartan-hydrochlorothiazide (DIOVAN-HCT) 320-25 MG per tablet Take 1 tablet by mouth daily.    . vitamin B-12 (CYANOCOBALAMIN) 1000 MCG tablet Take 1,000 mcg by mouth daily.    Marland Kitchen warfarin (COUMADIN) 1 MG tablet TAKE AS DIRECTED BY COUMADIN CLINIC 60 tablet 3  . ZETIA 10 MG tablet TAKE 1 TABLET BY MOUTH EVERY DAY 30 tablet 5   No facility-administered medications prior to visit.    PAST MEDICAL HISTORY: Past Medical History  Diagnosis Date  . Hypertension   . COPD, mild ([place])   . Dyslipidemia   . Chronic knee pain   . Diverticulosis   . Allergic rhinitis   . Gout   . Parkinson's disease ([place]) 03/2012    Dr Vilinda Blanks started  . Onychomycosis   . Skin lesion     Plan to remove left posterior ear lesion with electrocautery  . Pure hypercholesterolemia   . Vitamin D deficiency   . Unspecified hypothyroidism   . Osteoarthrosis, unspecified whether generalized or localized, other specified sites     Has gait disorder secondary to DJD of lower extremities  . Benign hypertensive heart disease without heart failure   . Vertigo   . Long term (current) use of anticoagulants   . Seborrheic keratosis, inflamed   . Constipation   . Coronary artery disease     s/p PCI of the RCA  . Permanent atrial fibrillation (HCC)       Chronic. ECHO 06/04/11 LVEF estimated by 2D at 52.1%  . Tachy-brady syndrome [facility])     s/p permanent pacemaker placement    PAST SURGICAL HISTORY: Past Surgical History  Procedure Laterality Date  . Pacemaker placement  2003    Tachybradycardia syndrome. Gen change 07/29/08, Dr. Leonia Reeves  . Laparoscopic partial colectomy      Partial resection of sigmoid colon  . Inguinal hernia repair Bilateral   . Fracture surgery Right     clavicle  . Tympanoplasty Bilateral     FAMILY HISTORY: Family History  Problem Relation Age of Onset  . CAD Father   . Diabetes Father   . Bladder Cancer    . Leukemia      SOCIAL HISTORY: Social History   Social History  . Marital Status: Widowed    Spouse Name: N/A  . Number of Children:  2  . Years of Education: 7TH   Occupational History  . Not on file.   Social History Main Topics  . Smoking status: Former Smoker -- 10 years    Types: Cigarettes    Quit date: 06/18/1948  . Smokeless tobacco: Never Used  . Alcohol Use: No  . Drug Use: No  . Sexual Activity: Not on file   Other Topics Concern  . Not on file   Social History Narrative   Patient lives at home with family.   Caffeine Use: rarely     PHYSICAL EXAM  Filed Vitals:   09/12/15 0830  BP: 106/67  Pulse: 65  Height: 5\' 2"  (1.575 m)  Weight: 173 lb (78.472 kg)   Body mass index is 31.63 kg/(m^2).  Generalized: In no acute distress, pleasant male in wheelchair  Neck: Supple, no carotid bruits  Cardiac: Irregular rate rhythm, no murmur   Neurological examination  Mentation: Awake and alert, language fluent, comprehension intact. POSITIVE MYERSON'S, POSITIVE SNOUT.  Cranial nerve II-XII: Pupils were equal round reactive to light extraocular movements were full, visual field were full on confrontational test. facial sensation and strength were normal. hearing was intact to finger rubbing bilaterally. Uvula tongue midline. head turning and shoulder shrug and were  normal and symmetric.Tongue protrusion into cheek strength was normal.   MOTOR: TONGUE TREMOR / DYSKINESIAS WITH FINGER TAPPING; NO COGWHEELING. BRADYKINESIA IN BUE and BLE. Normal bulk. BUE 5. BLE (HF 3, KE/KF 3, DF 4). No pronator drift.  SENSORY: normal and symmetric to light touch. ABSENT VIB AT TOES AND ANKLES.  COORDINATION: BUE ACTION TREMOR.  REFLEXES: Deep tendon reflexes in the upper and lower extremities are TRACE and symmetric.  GAIT/STATION: IN WHEELCHAIR   DIAGNOSTIC DATA (LABS, IMAGING, TESTING)  - I reviewed patient records, labs, notes, testing and imaging myself where available.  CT head without contrast 07/21/2012: Mild chronic small vessel ischemic disease with focal chronic lacunar infarcts in the right basal ganglia and left frontal subcortical regions. Mild diffuse atrophy. No acute findings.    ASSESSMENT AND PLAN  82 y.o. right-handed male with history of atrial fibrillation, hypertension, hypothyroidism, hypercholesteremia, coronary artery disease, here for evaluation of gait difficulty and tremor. He has 4/4 cardinal signs of Parkinson's disease. Stable on carb/levo 2 tabs TID, but patient has advanced parkinson's disease, gradually progression, and now patient is almost completely wheelchair bound.   Dx:  Parkinson disease ([place])  Dysphagia  Gait difficulty    PLAN:  - continue carb/levo 2 tabs TID - fall risk precautions reviewed  Return in about 1 year (around 09/11/2016).     Penni Bombard, MD [id number], [id number] Certified in Neurology, Neurophysiology and Neuroimaging  [facility] [address] [place], [place] [address] [phone]

## 2015-09-14 ENCOUNTER — Ambulatory Visit (INDEPENDENT_AMBULATORY_CARE_PROVIDER_SITE_OTHER): Payer: Medicare Other | Admitting: *Deleted

## 2015-09-14 DIAGNOSIS — Z5181 Encounter for therapeutic drug level monitoring: Secondary | ICD-10-CM

## 2015-09-14 DIAGNOSIS — I482 Chronic atrial fibrillation, unspecified: Secondary | ICD-10-CM

## 2015-09-14 DIAGNOSIS — I4891 Unspecified atrial fibrillation: Secondary | ICD-10-CM

## 2015-09-14 LAB — POCT INR: INR: 2.6

## 2015-09-15 DIAGNOSIS — L089 Local infection of the skin and subcutaneous tissue, unspecified: Secondary | ICD-10-CM | POA: Diagnosis not present

## 2015-09-15 DIAGNOSIS — C4441 Basal cell carcinoma of skin of scalp and neck: Secondary | ICD-10-CM | POA: Diagnosis not present

## 2015-09-17 DIAGNOSIS — C801 Malignant (primary) neoplasm, unspecified: Secondary | ICD-10-CM

## 2015-09-17 HISTORY — DX: Malignant (primary) neoplasm, unspecified: C80.1

## 2015-09-22 DIAGNOSIS — C44319 Basal cell carcinoma of skin of other parts of face: Secondary | ICD-10-CM | POA: Diagnosis not present

## 2015-09-29 DIAGNOSIS — T814XXA Infection following a procedure, initial encounter: Secondary | ICD-10-CM | POA: Diagnosis not present

## 2015-10-26 ENCOUNTER — Ambulatory Visit (INDEPENDENT_AMBULATORY_CARE_PROVIDER_SITE_OTHER): Payer: Medicare Other | Admitting: *Deleted

## 2015-10-26 DIAGNOSIS — I482 Chronic atrial fibrillation, unspecified: Secondary | ICD-10-CM

## 2015-10-26 DIAGNOSIS — I4891 Unspecified atrial fibrillation: Secondary | ICD-10-CM | POA: Diagnosis not present

## 2015-10-26 DIAGNOSIS — Z5181 Encounter for therapeutic drug level monitoring: Secondary | ICD-10-CM

## 2015-10-26 LAB — POCT INR: INR: 2.3

## 2015-10-31 ENCOUNTER — Encounter: Payer: Self-pay | Admitting: Internal Medicine

## 2015-10-31 DIAGNOSIS — I495 Sick sinus syndrome: Secondary | ICD-10-CM | POA: Diagnosis not present

## 2015-11-03 ENCOUNTER — Other Ambulatory Visit: Payer: Self-pay | Admitting: Cardiology

## 2015-11-21 ENCOUNTER — Other Ambulatory Visit: Payer: Self-pay | Admitting: Cardiology

## 2015-11-28 DIAGNOSIS — I509 Heart failure, unspecified: Secondary | ICD-10-CM | POA: Diagnosis not present

## 2015-11-28 DIAGNOSIS — B351 Tinea unguium: Secondary | ICD-10-CM | POA: Diagnosis not present

## 2015-11-28 DIAGNOSIS — E039 Hypothyroidism, unspecified: Secondary | ICD-10-CM | POA: Diagnosis not present

## 2015-11-28 DIAGNOSIS — M109 Gout, unspecified: Secondary | ICD-10-CM | POA: Diagnosis not present

## 2015-11-28 DIAGNOSIS — C4491 Basal cell carcinoma of skin, unspecified: Secondary | ICD-10-CM | POA: Diagnosis not present

## 2015-11-28 DIAGNOSIS — I2510 Atherosclerotic heart disease of native coronary artery without angina pectoris: Secondary | ICD-10-CM | POA: Diagnosis not present

## 2015-11-28 DIAGNOSIS — I4891 Unspecified atrial fibrillation: Secondary | ICD-10-CM | POA: Diagnosis not present

## 2015-11-28 DIAGNOSIS — J309 Allergic rhinitis, unspecified: Secondary | ICD-10-CM | POA: Diagnosis not present

## 2015-11-28 DIAGNOSIS — M1990 Unspecified osteoarthritis, unspecified site: Secondary | ICD-10-CM | POA: Diagnosis not present

## 2015-11-28 DIAGNOSIS — E559 Vitamin D deficiency, unspecified: Secondary | ICD-10-CM | POA: Diagnosis not present

## 2015-12-07 ENCOUNTER — Ambulatory Visit (INDEPENDENT_AMBULATORY_CARE_PROVIDER_SITE_OTHER): Payer: Medicare Other | Admitting: *Deleted

## 2015-12-07 DIAGNOSIS — I482 Chronic atrial fibrillation, unspecified: Secondary | ICD-10-CM

## 2015-12-07 DIAGNOSIS — Z5181 Encounter for therapeutic drug level monitoring: Secondary | ICD-10-CM | POA: Diagnosis not present

## 2015-12-07 DIAGNOSIS — I4891 Unspecified atrial fibrillation: Secondary | ICD-10-CM

## 2015-12-07 LAB — POCT INR: INR: 3.4

## 2015-12-19 ENCOUNTER — Other Ambulatory Visit: Payer: Self-pay | Admitting: *Deleted

## 2015-12-19 ENCOUNTER — Other Ambulatory Visit: Payer: Self-pay | Admitting: Cardiology

## 2015-12-19 MED ORDER — DILTIAZEM HCL ER COATED BEADS 120 MG PO CP24: ORAL_CAPSULE | ORAL | Status: DC

## 2016-01-04 ENCOUNTER — Ambulatory Visit (INDEPENDENT_AMBULATORY_CARE_PROVIDER_SITE_OTHER): Payer: Medicare Other | Admitting: *Deleted

## 2016-01-04 DIAGNOSIS — I4891 Unspecified atrial fibrillation: Secondary | ICD-10-CM | POA: Diagnosis not present

## 2016-01-04 DIAGNOSIS — I482 Chronic atrial fibrillation, unspecified: Secondary | ICD-10-CM

## 2016-01-04 DIAGNOSIS — Z5181 Encounter for therapeutic drug level monitoring: Secondary | ICD-10-CM | POA: Diagnosis not present

## 2016-01-04 LAB — POCT INR: INR: 1.5

## 2016-01-05 ENCOUNTER — Encounter: Payer: Self-pay | Admitting: Cardiology

## 2016-01-11 ENCOUNTER — Ambulatory Visit (INDEPENDENT_AMBULATORY_CARE_PROVIDER_SITE_OTHER): Payer: Medicare Other | Admitting: *Deleted

## 2016-01-11 DIAGNOSIS — I482 Chronic atrial fibrillation, unspecified: Secondary | ICD-10-CM

## 2016-01-11 DIAGNOSIS — Z5181 Encounter for therapeutic drug level monitoring: Secondary | ICD-10-CM

## 2016-01-11 DIAGNOSIS — I4891 Unspecified atrial fibrillation: Secondary | ICD-10-CM

## 2016-01-11 LAB — POCT INR: INR: 2.4

## 2016-02-02 ENCOUNTER — Ambulatory Visit (INDEPENDENT_AMBULATORY_CARE_PROVIDER_SITE_OTHER): Payer: Medicare Other

## 2016-02-02 DIAGNOSIS — Z5181 Encounter for therapeutic drug level monitoring: Secondary | ICD-10-CM | POA: Diagnosis not present

## 2016-02-02 DIAGNOSIS — I4891 Unspecified atrial fibrillation: Secondary | ICD-10-CM

## 2016-02-02 DIAGNOSIS — I482 Chronic atrial fibrillation, unspecified: Secondary | ICD-10-CM

## 2016-02-02 LAB — POCT INR: INR: 2.5

## 2016-02-17 ENCOUNTER — Ambulatory Visit: Payer: Medicare Other | Admitting: Cardiology

## 2016-02-29 ENCOUNTER — Other Ambulatory Visit: Payer: Self-pay | Admitting: Cardiology

## 2016-03-01 ENCOUNTER — Ambulatory Visit (INDEPENDENT_AMBULATORY_CARE_PROVIDER_SITE_OTHER): Payer: Medicare Other | Admitting: Pharmacist

## 2016-03-01 DIAGNOSIS — I482 Chronic atrial fibrillation, unspecified: Secondary | ICD-10-CM

## 2016-03-01 DIAGNOSIS — Z5181 Encounter for therapeutic drug level monitoring: Secondary | ICD-10-CM | POA: Diagnosis not present

## 2016-03-01 DIAGNOSIS — I4891 Unspecified atrial fibrillation: Secondary | ICD-10-CM

## 2016-03-01 LAB — POCT INR: INR: 3.2

## 2016-03-14 ENCOUNTER — Other Ambulatory Visit: Payer: Self-pay | Admitting: Cardiology

## 2016-03-14 MED ORDER — DILTIAZEM HCL ER COATED BEADS 120 MG PO CP24
120.0000 mg | ORAL_CAPSULE | Freq: Every day | ORAL | 0 refills | Status: DC
Start: 2016-03-14 — End: 2016-06-13

## 2016-03-16 ENCOUNTER — Encounter (INDEPENDENT_AMBULATORY_CARE_PROVIDER_SITE_OTHER): Payer: Self-pay

## 2016-03-16 ENCOUNTER — Ambulatory Visit (INDEPENDENT_AMBULATORY_CARE_PROVIDER_SITE_OTHER): Payer: Medicare Other | Admitting: Cardiology

## 2016-03-16 ENCOUNTER — Encounter: Payer: Self-pay | Admitting: Cardiology

## 2016-03-16 VITALS — BP 110/76 | HR 82 | Ht 62.0 in | Wt 169.0 lb

## 2016-03-16 DIAGNOSIS — E7800 Pure hypercholesterolemia, unspecified: Secondary | ICD-10-CM | POA: Diagnosis not present

## 2016-03-16 DIAGNOSIS — Z23 Encounter for immunization: Secondary | ICD-10-CM

## 2016-03-16 DIAGNOSIS — I495 Sick sinus syndrome: Secondary | ICD-10-CM

## 2016-03-16 DIAGNOSIS — I10 Essential (primary) hypertension: Secondary | ICD-10-CM | POA: Diagnosis not present

## 2016-03-16 DIAGNOSIS — I2510 Atherosclerotic heart disease of native coronary artery without angina pectoris: Secondary | ICD-10-CM | POA: Diagnosis not present

## 2016-03-16 DIAGNOSIS — I482 Chronic atrial fibrillation, unspecified: Secondary | ICD-10-CM

## 2016-03-16 LAB — HEPATIC FUNCTION PANEL
ALBUMIN: 3.8 g/dL (ref 3.6–5.1)
ALT: 13 U/L (ref 9–46)
AST: 16 U/L (ref 10–35)
Alkaline Phosphatase: 108 U/L (ref 40–115)
BILIRUBIN DIRECT: 0.1 mg/dL (ref <=0.2)
Indirect Bilirubin: 0.5 mg/dL (ref 0.2–1.2)
Total Bilirubin: 0.6 mg/dL (ref 0.2–1.2)
Total Protein: 6.4 g/dL (ref 6.1–8.1)

## 2016-03-16 LAB — BASIC METABOLIC PANEL
BUN: 22 mg/dL (ref 7–25)
CALCIUM: 9.3 mg/dL (ref 8.6–10.3)
CHLORIDE: 105 mmol/L (ref 98–110)
CO2: 28 mmol/L (ref 20–31)
CREATININE: 1.34 mg/dL — AB (ref 0.70–1.11)
GLUCOSE: 97 mg/dL (ref 65–99)
POTASSIUM: 4.7 mmol/L (ref 3.5–5.3)
SODIUM: 143 mmol/L (ref 135–146)

## 2016-03-16 LAB — LIPID PANEL
CHOL/HDL RATIO: 3.8 ratio (ref <=5.0)
Cholesterol: 138 mg/dL (ref 125–200)
HDL: 36 mg/dL — AB (ref >=40)
LDL Cholesterol: 75 mg/dL (ref <130)
Triglycerides: 137 mg/dL (ref <150)
VLDL: 27 mg/dL (ref <30)

## 2016-03-16 NOTE — Patient Instructions (Addendum)
Medication Instructions:  Your physician recommends that you continue on your current medications as directed. Please refer to the Current Medication list given to you today.   Labwork: Lipid profile/BMET/Liver profile today  Testing/Procedures: None   Follow-Up: Your physician wants you to follow-up in: 1 year with Dr Radford Pax. (September 2018).  You will receive a reminder letter in the mail two months in advance. If you don't receive a letter, please call our office to schedule the follow-up appointment.   Any Other Special Instructions Will Be Listed Below (If Applicable).  You had a Flu vaccine today.    If you need a refill on your cardiac medications before your next appointment, please call your pharmacy.

## 2016-03-16 NOTE — Progress Notes (Signed)
Cardiology Office Note    Date:  03/16/2016   ID:  Jerry Wood, DOB June 24, 1921, MRN [id number]  PCP:  Henrine Screws, MD  Cardiologist:  Fransico Him, MD   Chief Complaint  Patient presents with  . Coronary Artery Disease  . Hypertension    History of Present Illness:  Jerry Wood is a 82 y.o. male with a history of chronic atrial fibrillation, ASCAD, HTN, dyslipidemia and tachybrady syndrome s/p PPM who presents today for followup. He is doing well. He denies any chest pain, SOB, DOE, PND or orthopnea. He denies any dizziness or syncope. He denies any palpitations. He has chronic LE edema which is stable when using compression hose.     Past Medical History:  Diagnosis Date  . Allergic rhinitis   . Benign hypertensive heart disease without heart failure   . Chronic knee pain   . Constipation   . COPD, mild ([place])   . Coronary artery disease    s/p PCI of the RCA  . Diverticulosis   . Dyslipidemia   . Gout   . Hypertension   . Long term (current) use of anticoagulants   . Onychomycosis   . 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 Parkinson's disease ([place]) 03/2012   Dr Vilinda Blanks started  . Permanent atrial fibrillation (HCC)    Chronic. ECHO 06/04/11 LVEF estimated by 2D at 52.1%  . Pure hypercholesterolemia   . Seborrheic keratosis, inflamed   . Skin lesion    Plan to remove left posterior ear lesion with electrocautery  . Tachy-brady syndrome [facility])    s/p permanent pacemaker placement  . Unspecified hypothyroidism   . Vertigo   . Vitamin D deficiency     Past Surgical History:  Procedure Laterality Date  . FRACTURE SURGERY Right    clavicle  . INGUINAL HERNIA REPAIR Bilateral   . LAPAROSCOPIC PARTIAL COLECTOMY     Partial resection of sigmoid colon  . PACEMAKER PLACEMENT  2003   Tachybradycardia syndrome. Gen change 07/29/08, Dr.  Leonia Reeves  . TYMPANOPLASTY Bilateral     Current Medications: Outpatient Medications Prior to Visit  Medication Sig Dispense Refill  . carbidopa-levodopa (SINEMET IR) 25-100 MG tablet Take 2 tablets by mouth 3 (three) times daily. 540 tablet 4  . Cholecalciferol (VITAMIN D3) 2000 UNITS TABS Take 2,000 Units by mouth daily.    . clotrimazole-betamethasone (LOTRISONE) cream Apply 1 application topically daily as needed (itching).     Marland Kitchen diltiazem (CARDIZEM CD) 120 MG 24 hr capsule TAKE 1 CAPSULE BY MOUTH EVERY DAY 90 capsule 0  . diltiazem (CARDIZEM CD) 120 MG 24 hr capsule Take 1 capsule (120 mg total) by mouth daily. 90 capsule 0  . ezetimibe (ZETIA) 10 MG tablet TAKE 1 TABLET BY MOUTH EVERY DAY 30 tablet 5  . fexofenadine (ALLEGRA) 60 MG tablet Take 60 mg by mouth daily as needed for allergies.     . furosemide (LASIX) 80 MG tablet Take 80 mg by mouth daily.    Marland Kitchen levothyroxine (SYNTHROID, LEVOTHROID) 75 MCG tablet Take 75 mcg by mouth daily before breakfast.     . Multiple Vitamin (MULTIVITAMIN) capsule Take 1 capsule by mouth daily.    . Multiple Vitamins-Minerals (PRESERVISION AREDS) CAPS Take 1 capsule by mouth daily.     . potassium chloride SA (K-DUR,KLOR-CON) 20 MEQ tablet Take 30 mEq by mouth daily.    Marland Kitchen  pravastatin (PRAVACHOL) 40 MG tablet TAKE 1 TABLET BY MOUTH EVERY DAY 30 tablet 9  . valsartan (DIOVAN) 320 MG tablet TAKE 1 TABLET (320 MG TOTAL) BY MOUTH DAILY. 90 tablet 3  . valsartan-hydrochlorothiazide (DIOVAN-HCT) 320-25 MG per tablet Take 1 tablet by mouth daily.    . vitamin B-12 (CYANOCOBALAMIN) 1000 MCG tablet Take 1,000 mcg by mouth daily.    Marland Kitchen warfarin (COUMADIN) 1 MG tablet TAKE AS DIRECTED BY COUMADIN CLINIC 60 tablet 3   No facility-administered medications prior to visit.      Allergies:   Review of patient's allergies indicates no known allergies.   Social History   Social History  . Marital status: Widowed    Spouse name: N/A  . Number of children: 2  .  Years of education: 7TH   Social History Main Topics  . Smoking status: Former Smoker    Years: 10.00    Types: Cigarettes    Quit date: 06/18/1948  . Smokeless tobacco: Never Used  . Alcohol use No  . Drug use: No  . Sexual activity: Not Asked   Other Topics Concern  . None   Social History Narrative   Patient lives at home with family.   Caffeine Use: rarely     Family History:  The patient's family history includes CAD in his father; Diabetes in his father.   ROS:   Please see the history of present illness.    ROS All other systems reviewed and are negative.  No flowsheet data found.     PHYSICAL EXAM:   VS:  BP 110/76   Pulse 82   Ht 5\' 2"  (1.575 m)   Wt 169 lb (76.7 kg)   BMI 30.91 kg/m    GEN: Well nourished, well developed, in no acute distress  HEENT: normal  Neck: no JVD, carotid bruits, or masses Cardiac: RRR; no murmurs, rubs, or gallops,no edema.  Intact distal pulses bilaterally.  Respiratory:  clear to auscultation bilaterally, normal work of breathing GI: soft, nontender, nondistended, + BS MS: no deformity or atrophy  Skin: warm and dry, no rash Neuro:  Alert and Oriented x 3, Strength and sensation are intact Psych: euthymic mood, full affect  Wt Readings from Last 3 Encounters:  03/16/16 169 lb (76.7 kg)  09/12/15 173 lb (78.5 kg)  08/16/15 170 lb (77.1 kg)      Studies/Labs Reviewed:   EKG:  EKG is not ordered today.   Recent Labs: No results found for requested labs within last 8760 hours.   Lipid Panel    Component Value Date/Time   CHOL 128 01/26/2015 0846   TRIG 128.0 01/26/2015 0846   HDL 39.60 01/26/2015 0846   CHOLHDL 3 01/26/2015 0846   VLDL 25.6 01/26/2015 0846   LDLCALC 62 01/26/2015 0846    Additional studies/ records that were reviewed today include:  none    ASSESSMENT:    1. Atherosclerosis of native coronary artery of native heart without angina pectoris   2. Chronic atrial fibrillation (HCC)   3.  Essential hypertension   4. Tachycardia-bradycardia ([place])   5. Pure hypercholesterolemia      PLAN:  In order of problems listed above:  1. ASCAD s/p remote PCI of the RCA. He has no anginal symptoms.  Continue statin.  He is not on ASA due to warfarin. 2. Chronic atrial fibrillation rate controlled. Continue warfarin and Cardizem. 3. HTN - BP controlled on current meds.  Continue Cardizem, ARB and diuretic. 4. Tachy-brady  syndrome s/p PPM - followed in device clinic 5. Hyperlipidemia - his LDL goal is < 70.  I will check an FLP and ALT today. Continue statin.    Medication Adjustments/Labs and Tests Ordered: Current medicines are reviewed at length with the patient today.  Concerns regarding medicines are outlined above.  Medication changes, Labs and Tests ordered today are listed in the Patient Instructions below.  There are no Patient Instructions on file for this visit.   Signed, Fransico Him, MD  03/16/2016 8:07 AM    [facility] [place], [place], [place]  [address] Phone: [phone]; Fax: [phone]

## 2016-03-22 ENCOUNTER — Ambulatory Visit (INDEPENDENT_AMBULATORY_CARE_PROVIDER_SITE_OTHER): Payer: Medicare Other | Admitting: *Deleted

## 2016-03-22 DIAGNOSIS — I482 Chronic atrial fibrillation, unspecified: Secondary | ICD-10-CM

## 2016-03-22 DIAGNOSIS — Z5181 Encounter for therapeutic drug level monitoring: Secondary | ICD-10-CM | POA: Diagnosis not present

## 2016-03-22 DIAGNOSIS — I4891 Unspecified atrial fibrillation: Secondary | ICD-10-CM | POA: Diagnosis not present

## 2016-03-22 LAB — POCT INR: INR: 3.7

## 2016-04-04 ENCOUNTER — Other Ambulatory Visit: Payer: Self-pay | Admitting: Diagnostic Neuroimaging

## 2016-04-05 ENCOUNTER — Ambulatory Visit (INDEPENDENT_AMBULATORY_CARE_PROVIDER_SITE_OTHER): Payer: Medicare Other | Admitting: *Deleted

## 2016-04-05 DIAGNOSIS — Z5181 Encounter for therapeutic drug level monitoring: Secondary | ICD-10-CM | POA: Diagnosis not present

## 2016-04-05 DIAGNOSIS — I482 Chronic atrial fibrillation, unspecified: Secondary | ICD-10-CM

## 2016-04-05 DIAGNOSIS — I4891 Unspecified atrial fibrillation: Secondary | ICD-10-CM

## 2016-04-05 LAB — POCT INR: INR: 3

## 2016-04-10 ENCOUNTER — Other Ambulatory Visit: Payer: Self-pay | Admitting: Cardiology

## 2016-04-14 ENCOUNTER — Other Ambulatory Visit: Payer: Self-pay | Admitting: Cardiology

## 2016-04-27 ENCOUNTER — Ambulatory Visit (INDEPENDENT_AMBULATORY_CARE_PROVIDER_SITE_OTHER): Payer: Medicare Other | Admitting: *Deleted

## 2016-04-27 DIAGNOSIS — Z5181 Encounter for therapeutic drug level monitoring: Secondary | ICD-10-CM

## 2016-04-27 DIAGNOSIS — I482 Chronic atrial fibrillation, unspecified: Secondary | ICD-10-CM

## 2016-04-27 DIAGNOSIS — I4891 Unspecified atrial fibrillation: Secondary | ICD-10-CM | POA: Diagnosis not present

## 2016-04-27 LAB — POCT INR: INR: 2.7

## 2016-05-25 ENCOUNTER — Ambulatory Visit (INDEPENDENT_AMBULATORY_CARE_PROVIDER_SITE_OTHER): Payer: Medicare Other | Admitting: *Deleted

## 2016-05-25 DIAGNOSIS — Z5181 Encounter for therapeutic drug level monitoring: Secondary | ICD-10-CM | POA: Diagnosis not present

## 2016-05-25 DIAGNOSIS — I482 Chronic atrial fibrillation, unspecified: Secondary | ICD-10-CM

## 2016-05-25 DIAGNOSIS — I4891 Unspecified atrial fibrillation: Secondary | ICD-10-CM | POA: Diagnosis not present

## 2016-05-25 LAB — POCT INR: INR: 3.3

## 2016-06-01 ENCOUNTER — Encounter: Payer: Self-pay | Admitting: Internal Medicine

## 2016-06-01 DIAGNOSIS — I495 Sick sinus syndrome: Secondary | ICD-10-CM | POA: Diagnosis not present

## 2016-06-13 ENCOUNTER — Other Ambulatory Visit: Payer: Self-pay | Admitting: Cardiology

## 2016-06-15 ENCOUNTER — Ambulatory Visit (INDEPENDENT_AMBULATORY_CARE_PROVIDER_SITE_OTHER): Payer: Medicare Other | Admitting: *Deleted

## 2016-06-15 DIAGNOSIS — I482 Chronic atrial fibrillation, unspecified: Secondary | ICD-10-CM

## 2016-06-15 DIAGNOSIS — Z5181 Encounter for therapeutic drug level monitoring: Secondary | ICD-10-CM | POA: Diagnosis not present

## 2016-06-15 DIAGNOSIS — I4891 Unspecified atrial fibrillation: Secondary | ICD-10-CM | POA: Diagnosis not present

## 2016-06-15 LAB — POCT INR: INR: 1.5

## 2016-06-22 IMAGING — CR DG CHEST 2V
2 series · 2 of 2 positions shown · non-contrast
Comparison: 06/01/2011

CLINICAL DATA: Shortness of breath for 3 weeks

EXAM:
CHEST - 2 VIEW

[view not recorded (1 of 2)]
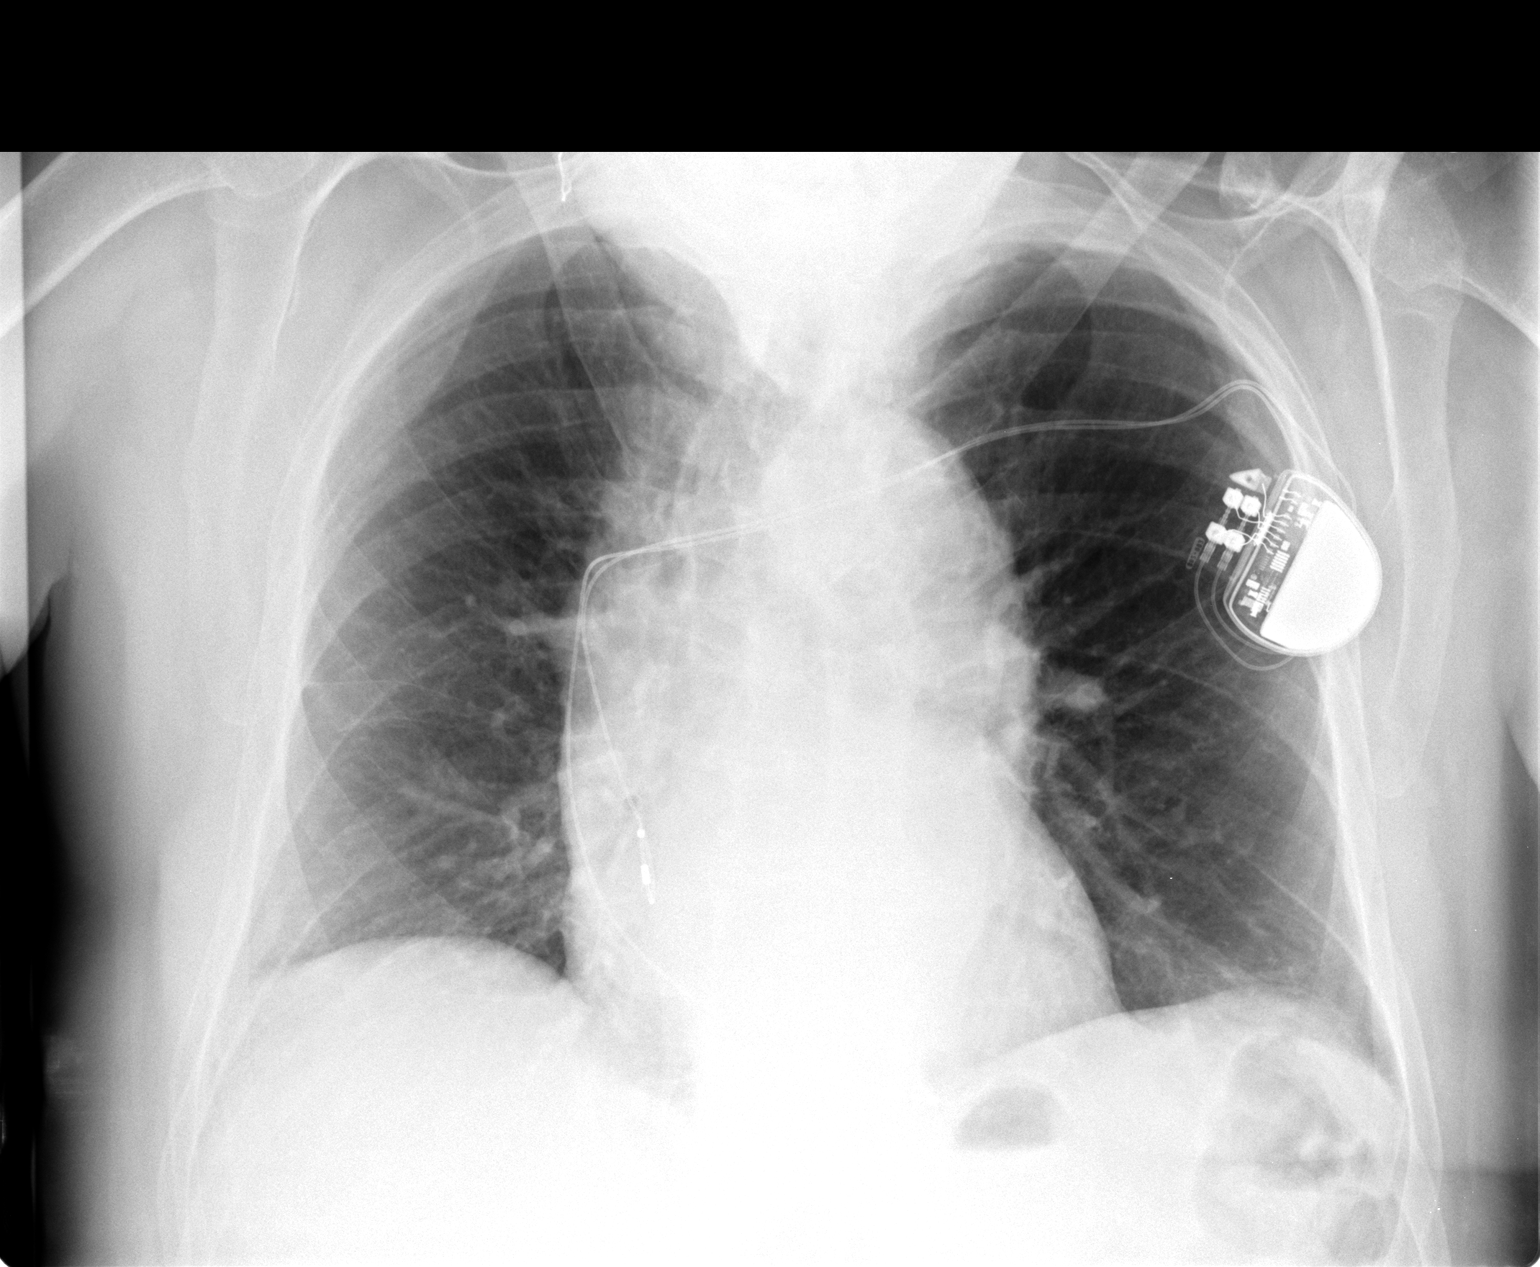

[view not recorded (2 of 2)]
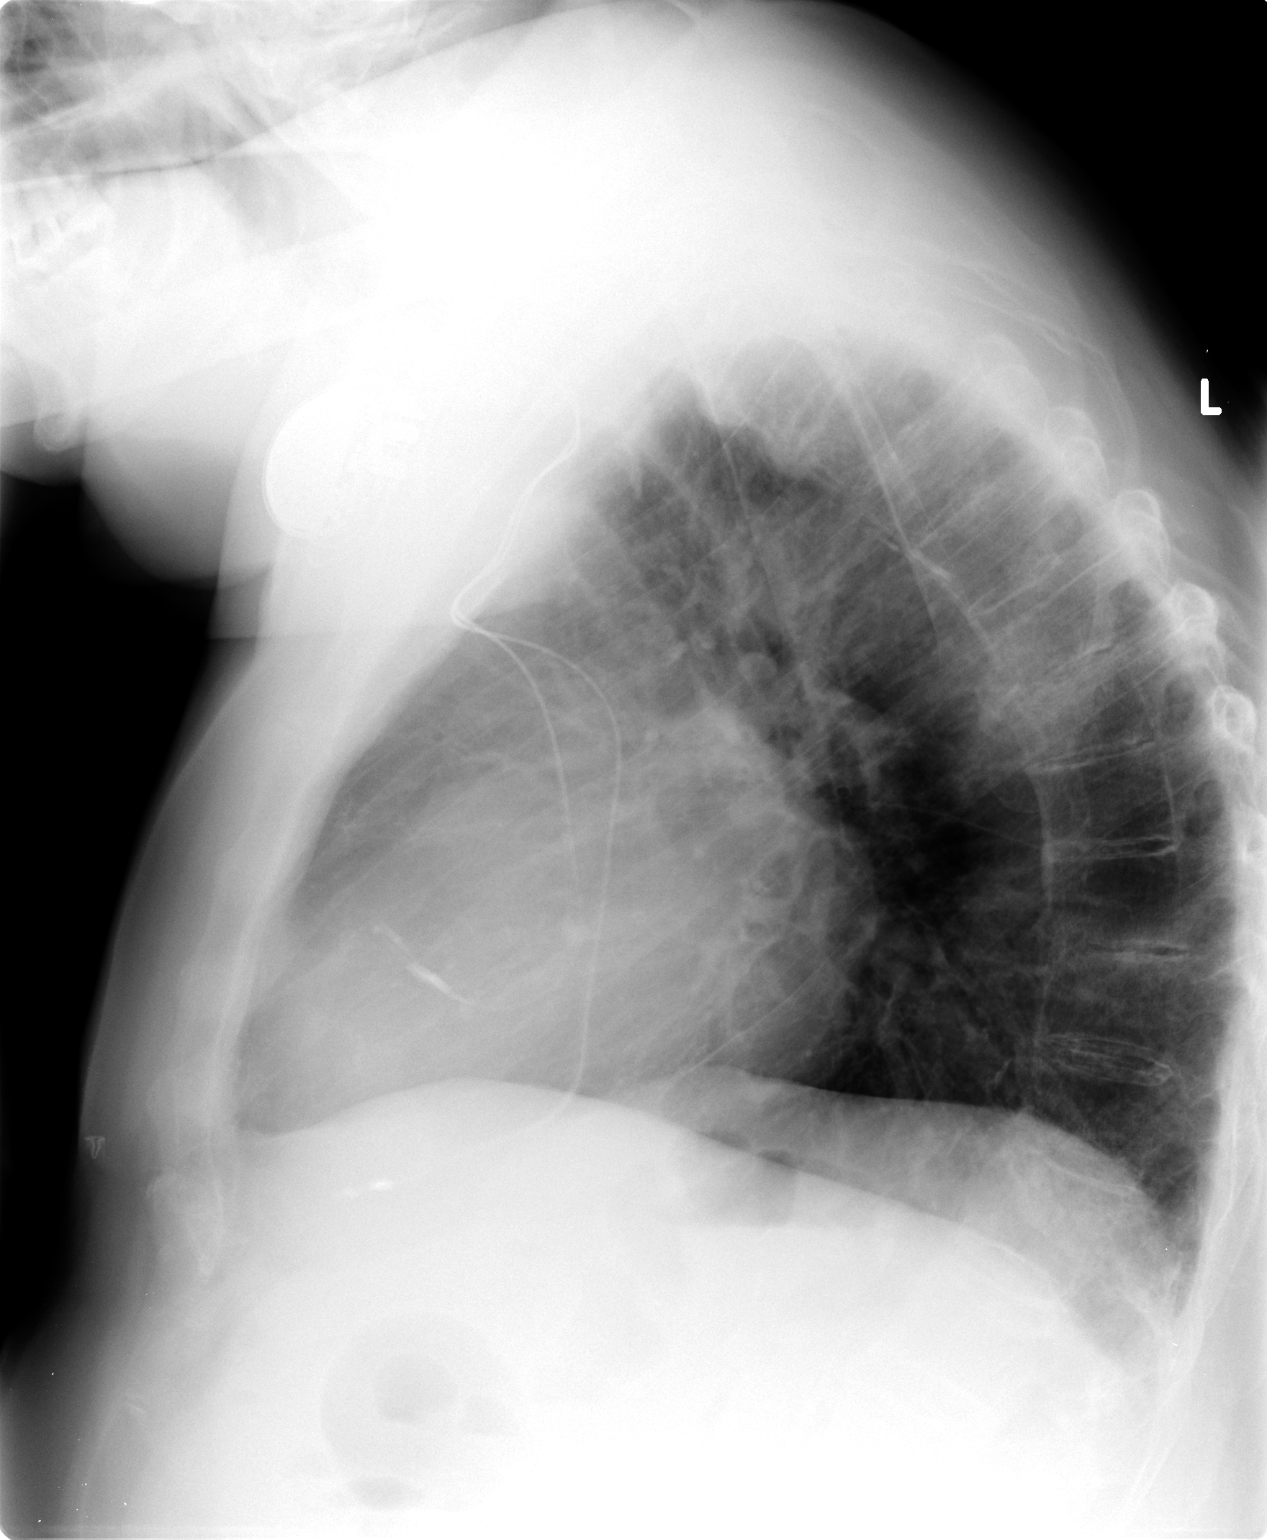

[2 of 2 positions shown; findings below may reference images not displayed]

FINDINGS: Cardiac shadow is within normal limits. A pacing device is again
seen and stable. The lungs are clear bilaterally. No focal
infiltrate or sizable effusion is seen. Degenerative change of
thoracic spine is again noted.
IMPRESSION: No active disease.

## 2016-06-25 DIAGNOSIS — Z1389 Encounter for screening for other disorder: Secondary | ICD-10-CM | POA: Diagnosis not present

## 2016-06-25 DIAGNOSIS — I509 Heart failure, unspecified: Secondary | ICD-10-CM | POA: Diagnosis not present

## 2016-06-25 DIAGNOSIS — E559 Vitamin D deficiency, unspecified: Secondary | ICD-10-CM | POA: Diagnosis not present

## 2016-06-25 DIAGNOSIS — I4891 Unspecified atrial fibrillation: Secondary | ICD-10-CM | POA: Diagnosis not present

## 2016-06-25 DIAGNOSIS — R609 Edema, unspecified: Secondary | ICD-10-CM | POA: Diagnosis not present

## 2016-06-25 DIAGNOSIS — E785 Hyperlipidemia, unspecified: Secondary | ICD-10-CM | POA: Diagnosis not present

## 2016-06-25 DIAGNOSIS — E039 Hypothyroidism, unspecified: Secondary | ICD-10-CM | POA: Diagnosis not present

## 2016-06-25 DIAGNOSIS — J449 Chronic obstructive pulmonary disease, unspecified: Secondary | ICD-10-CM | POA: Diagnosis not present

## 2016-06-25 DIAGNOSIS — Z79899 Other long term (current) drug therapy: Secondary | ICD-10-CM | POA: Diagnosis not present

## 2016-06-25 DIAGNOSIS — C4491 Basal cell carcinoma of skin, unspecified: Secondary | ICD-10-CM | POA: Diagnosis not present

## 2016-06-25 DIAGNOSIS — M109 Gout, unspecified: Secondary | ICD-10-CM | POA: Diagnosis not present

## 2016-06-25 DIAGNOSIS — I2510 Atherosclerotic heart disease of native coronary artery without angina pectoris: Secondary | ICD-10-CM | POA: Diagnosis not present

## 2016-06-25 DIAGNOSIS — M1990 Unspecified osteoarthritis, unspecified site: Secondary | ICD-10-CM | POA: Diagnosis not present

## 2016-06-25 DIAGNOSIS — Z0001 Encounter for general adult medical examination with abnormal findings: Secondary | ICD-10-CM | POA: Diagnosis not present

## 2016-06-25 DIAGNOSIS — B351 Tinea unguium: Secondary | ICD-10-CM | POA: Diagnosis not present

## 2016-06-25 DIAGNOSIS — J309 Allergic rhinitis, unspecified: Secondary | ICD-10-CM | POA: Diagnosis not present

## 2016-06-28 ENCOUNTER — Ambulatory Visit (INDEPENDENT_AMBULATORY_CARE_PROVIDER_SITE_OTHER): Payer: Medicare Other | Admitting: *Deleted

## 2016-06-28 DIAGNOSIS — I4891 Unspecified atrial fibrillation: Secondary | ICD-10-CM

## 2016-06-28 DIAGNOSIS — I482 Chronic atrial fibrillation, unspecified: Secondary | ICD-10-CM

## 2016-06-28 DIAGNOSIS — Z5181 Encounter for therapeutic drug level monitoring: Secondary | ICD-10-CM

## 2016-06-28 LAB — POCT INR: INR: 2.6

## 2016-07-12 ENCOUNTER — Ambulatory Visit (INDEPENDENT_AMBULATORY_CARE_PROVIDER_SITE_OTHER): Payer: Medicare Other | Admitting: Pharmacist

## 2016-07-12 DIAGNOSIS — I4891 Unspecified atrial fibrillation: Secondary | ICD-10-CM

## 2016-07-12 DIAGNOSIS — H524 Presbyopia: Secondary | ICD-10-CM | POA: Diagnosis not present

## 2016-07-12 DIAGNOSIS — Z5181 Encounter for therapeutic drug level monitoring: Secondary | ICD-10-CM | POA: Diagnosis not present

## 2016-07-12 DIAGNOSIS — I482 Chronic atrial fibrillation, unspecified: Secondary | ICD-10-CM

## 2016-07-12 LAB — POCT INR: INR: 1.7

## 2016-07-26 ENCOUNTER — Ambulatory Visit (INDEPENDENT_AMBULATORY_CARE_PROVIDER_SITE_OTHER): Payer: Medicare Other | Admitting: *Deleted

## 2016-07-26 DIAGNOSIS — I482 Chronic atrial fibrillation, unspecified: Secondary | ICD-10-CM

## 2016-07-26 DIAGNOSIS — Z5181 Encounter for therapeutic drug level monitoring: Secondary | ICD-10-CM | POA: Diagnosis not present

## 2016-07-26 DIAGNOSIS — I4891 Unspecified atrial fibrillation: Secondary | ICD-10-CM | POA: Diagnosis not present

## 2016-07-26 LAB — POCT INR: INR: 3

## 2016-07-28 ENCOUNTER — Other Ambulatory Visit: Payer: Self-pay | Admitting: Cardiology

## 2016-08-15 ENCOUNTER — Other Ambulatory Visit: Payer: Self-pay | Admitting: Cardiology

## 2016-08-16 ENCOUNTER — Ambulatory Visit (INDEPENDENT_AMBULATORY_CARE_PROVIDER_SITE_OTHER): Payer: Medicare Other | Admitting: *Deleted

## 2016-08-16 DIAGNOSIS — I482 Chronic atrial fibrillation, unspecified: Secondary | ICD-10-CM

## 2016-08-16 DIAGNOSIS — I4891 Unspecified atrial fibrillation: Secondary | ICD-10-CM | POA: Diagnosis not present

## 2016-08-16 DIAGNOSIS — Z5181 Encounter for therapeutic drug level monitoring: Secondary | ICD-10-CM

## 2016-08-16 LAB — POCT INR: INR: 2.9

## 2016-08-31 ENCOUNTER — Encounter: Payer: Self-pay | Admitting: Internal Medicine

## 2016-08-31 DIAGNOSIS — I495 Sick sinus syndrome: Secondary | ICD-10-CM | POA: Diagnosis not present

## 2016-09-10 ENCOUNTER — Ambulatory Visit (INDEPENDENT_AMBULATORY_CARE_PROVIDER_SITE_OTHER): Payer: Medicare Other | Admitting: Diagnostic Neuroimaging

## 2016-09-10 ENCOUNTER — Encounter: Payer: Self-pay | Admitting: Diagnostic Neuroimaging

## 2016-09-10 VITALS — BP 107/69 | HR 87

## 2016-09-10 DIAGNOSIS — R1310 Dysphagia, unspecified: Secondary | ICD-10-CM

## 2016-09-10 DIAGNOSIS — R269 Unspecified abnormalities of gait and mobility: Secondary | ICD-10-CM

## 2016-09-10 DIAGNOSIS — G20 Parkinson's disease: Secondary | ICD-10-CM | POA: Diagnosis not present

## 2016-09-10 MED ORDER — CARBIDOPA-LEVODOPA 25-100 MG PO TABS: 2.0000 | ORAL_TABLET | Freq: Three times a day (TID) | ORAL | 4 refills | Status: DC

## 2016-09-10 NOTE — Progress Notes (Signed)
PATIENT: Jerry Wood DOB: 03-21-1922   REASON FOR VISIT: follow up for Parkinson's disease HISTORY FROM: patient and daughter [name])  Chief Complaint  Patient presents with  . Parkinson disease    rm 6, dgtr- Dawn, "doing well per dgtr"  . Follow-up    one year     HISTORY OF PRESENT ILLNESS:  UPDATE 09/10/16: Since last visit, doing well. No major changes. Tolerating meds. Mainly in wheelchair. No falls. Daughter helping at home. Continues with speech diff, dizziness, gait diff. No other alleviating or aggravating factors.   UPDATE 09/12/15: Since last visit, doing about the same to slightly progressing with PD. No falls. Mainly in wheelchair. Uses cane occ to transfer, but needs help. Daughter helps with ADLs.   UPDATE 03/15/15: Since last visit, patient doing well. No falls. Tolerating meds. Some swallow issues with larger pills. Doing ok with foods.  UPDATE 09/09/14: Since last visit, doing well. Tremor stable. Notes some internal shaking sensation (abdomen) without anxiety, SOB or CP. Tolerating carb/levo 25/100 2 tabs TID.   UPDATE 03/11/14 (VRP): Since last visit, doing better with tremor control on higher carb/levo (2 tabs PO TID). Able to transfer from wheelchair to bed and commode on his own. Walks short distance with walker, but very unsteady.   UPDATE 07/17/13 (LL):  Since last visit, he increased Sinemet to 1.5 tablets and then to 2 tablets TID.   Since being at 2 tablets TID, he states that sometimes it is harder for him to speak; get his words out.  Tremors are still bothersome, and and there is now akathisia with his tongue and jaw. The tremor makes it difficult for him to get to sleep, but when he does sleep, he sleeps well.  Denies dreaming or hallucinations.   UPDATE 01/14/13 (LL): Patient comes in for follow up for Parkinson's Disease. Patient reports that his tremor is worse in both upper extremities, it makes it difficult for him to get to sleep at night. It does  not hinder his ability to feed himself. No problem with taste, smell or appetite. Denies constipation, has some urinary urgency. Takes dose of Carb/Levo at 7; 12 and 4. Feels like medication helped at first but now it's not. No side effects from med. He goes to sleep at 10:30 pm. Denies dreaming or hallucinations.   UPDATE 07/15/12 (VRP): Since last visit, carb/levo has helped his tremor. Now able to fall asleep better because tremor is better controlled. No side effects from medications. Satisfied with the results so far.   Prior HPI (03/26/2012) (VRP): 83 year old right-handed male with history of atrial fibrillation, hypertension, hypothyroidism, hypercholesterolemia, coronary artery disease, here for evaluation of gait difficulty and tremor. Evaluate for possible Parkinson disease. In 2010, patient began to develop intermittent left upper extremity resting tremor. Tremor progressed to more continuous and bilateral tremor. He does not have significant difficulty when he is doing actions. In addition by 2011 he began to use a cane for walking imbalance. In 2012 he was using a walker. In 2013 he's been using a wheelchair most of the time. He is able to take a few steps with assistance to the bathroom. Of note patient's voice has become more quite over the past 2 years. No constipation, sleep disturbance, anxiety or smell or taste difficulty. His tremors worsened nighttime. No family history of tremor.   REVIEW OF SYSTEMS: Full 14 system review of systems performed and notable only for: only per HPI.    ALLERGIES: No Known  Allergies  HOME MEDICATIONS: Outpatient Medications Prior to Visit  Medication Sig Dispense Refill  . carbidopa-levodopa (SINEMET IR) 25-100 MG tablet Take 2 tablets by mouth 3 (three) times daily. 540 tablet 4  . CARTIA XT 120 MG 24 hr capsule TAKE 1 CAPSULE(120 MG) BY MOUTH DAILY 90 capsule 2  . Cholecalciferol (VITAMIN D3) 2000 UNITS TABS Take 2,000 Units by mouth daily.    Marland Kitchen  ezetimibe (ZETIA) 10 MG tablet TAKE 1 TABLET BY MOUTH DAILY 30 tablet 5  . fexofenadine (ALLEGRA) 60 MG tablet Take 60 mg by mouth daily as needed for allergies.     . furosemide (LASIX) 80 MG tablet Take 80 mg by mouth daily.    Marland Kitchen levothyroxine (SYNTHROID, LEVOTHROID) 75 MCG tablet Take 75 mcg by mouth daily before breakfast.     . Multiple Vitamin (MULTIVITAMIN) capsule Take 1 capsule by mouth daily.    . Multiple Vitamins-Minerals (PRESERVISION AREDS) CAPS Take 1 capsule by mouth daily.     . potassium chloride SA (K-DUR,KLOR-CON) 20 MEQ tablet Take 30 mEq by mouth daily.    . pravastatin (PRAVACHOL) 40 MG tablet TAKE 1 TABLET BY MOUTH EVERY DAY 30 tablet 9  . valsartan (DIOVAN) 320 MG tablet TAKE 1 TABLET BY MOUTH EVERY DAY 90 tablet 3  . vitamin B-12 (CYANOCOBALAMIN) 1000 MCG tablet Take 1,000 mcg by mouth daily.    Marland Kitchen warfarin (COUMADIN) 1 MG tablet TAKE AS DIRECTED BY COUMADIN CLINIC 60 tablet 3  . clotrimazole-betamethasone (LOTRISONE) cream Apply 1 application topically daily as needed (itching).     . valsartan-hydrochlorothiazide (DIOVAN-HCT) 320-25 MG per tablet Take 1 tablet by mouth daily.     No facility-administered medications prior to visit.     PAST MEDICAL HISTORY: Past Medical History:  Diagnosis Date  . Allergic rhinitis   . Benign hypertensive heart disease without heart failure   . Cancer ([place]) 09/2015   skin ,basal cell  . Chronic knee pain   . Constipation   . COPD, mild ([place])   . Coronary artery disease    s/p PCI of the RCA  . Diverticulosis   . Dyslipidemia   . Gout   . Hypertension   . Long term (current) use of anticoagulants   . Onychomycosis   . 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 Parkinson's disease ([place]) 03/2012   Dr Vilinda Blanks started  . Permanent atrial fibrillation (HCC)    Chronic. ECHO 06/04/11 LVEF estimated by 2D at 52.1%   . Pure hypercholesterolemia   . Seborrheic keratosis, inflamed   . Skin lesion    Plan to remove left posterior ear lesion with electrocautery  . Tachy-brady syndrome [facility])    s/p permanent pacemaker placement  . Unspecified hypothyroidism   . Vertigo   . Vitamin D deficiency     PAST SURGICAL HISTORY: Past Surgical History:  Procedure Laterality Date  . FRACTURE SURGERY Right    clavicle  . INGUINAL HERNIA REPAIR Bilateral   . LAPAROSCOPIC PARTIAL COLECTOMY     Partial resection of sigmoid colon  . PACEMAKER PLACEMENT  2003   Tachybradycardia syndrome. Gen change 07/29/08, Dr. Leonia Reeves  . TYMPANOPLASTY Bilateral     FAMILY HISTORY: Family History  Problem Relation Age of Onset  . CAD Father   . Diabetes Father   . Bladder Cancer    . Leukemia      SOCIAL HISTORY: Social History  Social History  . Marital status: Widowed    Spouse name: N/A  . Number of children: 2  . Years of education: 7TH   Occupational History  . Not on file.   Social History Main Topics  . Smoking status: Former Smoker    Years: 10.00    Types: Cigarettes    Quit date: 06/18/1948  . Smokeless tobacco: Never Used  . Alcohol use No  . Drug use: No  . Sexual activity: Not on file   Other Topics Concern  . Not on file   Social History Narrative   Patient lives at home with family.   Caffeine Use: rarely     PHYSICAL EXAM  Vitals:   09/10/16 0823  BP: 107/69  Pulse: 87   There is no height or weight on file to calculate BMI.  Generalized: In no acute distress, pleasant male in wheelchair  Neck: Supple, no carotid bruits  Cardiac: Irregular rate rhythm, no murmur   Neurological examination  Mentation: Awake and alert, language fluent, comprehension intact. POSITIVE MYERSON'S, POSITIVE SNOUT.  Cranial nerve II-XII: Pupils were equal round reactive to light extraocular movements were full, visual field were full on confrontational test. facial sensation and strength were  normal. hearing was intact to finger rubbing bilaterally. Uvula tongue midline. head turning and shoulder shrug and were normal and symmetric.Tongue protrusion into cheek strength was normal.   MOTOR: MINOR TONGUE TREMOR / DYSKINESIAS WITH FINGER TAPPING; NO COGWHEELING. BRADYKINESIA IN BUE and BLE. Normal bulk. BUE 5. BLE (HF 3, KE/KF 3, DF 3). No pronator drift.  SENSORY: normal and symmetric to light touch. ABSENT VIB AT TOES AND ANKLES.  COORDINATION: BUE ACTION TREMOR.  REFLEXES: Deep tendon reflexes in the upper and lower extremities are TRACE and symmetric.  GAIT/STATION: IN WHEELCHAIR   DIAGNOSTIC DATA (LABS, IMAGING, TESTING)  No results found for: WBC, HGB, HCT, MCV, PLT  CMP Latest Ref Rng & Units 03/16/2016 01/26/2015 07/30/2014  Glucose 65 - 99 mg/dL 97 91 89  BUN 7 - 25 mg/dL 22 24(H) 24(H)  Creatinine 0.70 - 1.11 mg/dL 1.34(H) 1.09 1.13  Sodium 135 - 146 mmol/L 143 142 143  Potassium 3.5 - 5.3 mmol/L 4.7 4.7 3.9  Chloride 98 - 110 mmol/L 105 105 105  CO2 20 - 31 mmol/L 28 30 32  Calcium 8.6 - 10.3 mg/dL 9.3 9.7 8.9  Total Protein 6.1 - 8.1 g/dL 6.4 7.1 6.9  Total Bilirubin 0.2 - 1.2 mg/dL 0.6 0.8 0.8  Alkaline Phos 40 - 115 U/L 108 117 114  AST 10 - 35 U/L 16 15 18   ALT 9 - 46 U/L 13 9 15    - I reviewed patient records, labs, notes, testing and imaging myself where available.  07/21/12 CT head  - Mild chronic small vessel ischemic disease with focal chronic lacunar infarcts in the right basal ganglia and left frontal subcortical regions. Mild diffuse atrophy. No acute findings.     ASSESSMENT AND PLAN  83 y.o. right-handed male with history of atrial fibrillation, hypertension, hypothyroidism, hypercholesteremia, coronary artery disease, here for evaluation of gait difficulty and tremor. He has 4/4 cardinal signs of Parkinson's disease. Stable on carb/levo 2 tabs TID, but patient has advanced parkinson's disease, gradually progression, and now patient is almost completely  wheelchair bound.    Dx:  Parkinson disease ([place])  Dysphagia, unspecified type  Gait difficulty    PLAN:  I spent 15 minutes of face to face time with patient. Greater than 50%  of time was spent in counseling and coordination of care with patient. In summary we discussed:  - continue carb/levo 2 tabs TID - fall risk precautions reviewed - I offered to have as needed follow up, but patient prefers to continue annual neurology check up  Meds ordered this encounter  Medications  . carbidopa-levodopa (SINEMET IR) 25-100 MG tablet    Sig: Take 2 tablets by mouth 3 (three) times daily.    Dispense:  540 tablet    Refill:  4   Return in about 1 year (around 09/10/2017).     Penni Bombard, MD 01/10/2034, 5:97 AM Certified in Neurology, Neurophysiology and Neuroimaging  [facility] [address] [place], [place] [address] [phone]

## 2016-09-13 ENCOUNTER — Ambulatory Visit (INDEPENDENT_AMBULATORY_CARE_PROVIDER_SITE_OTHER): Payer: Medicare Other | Admitting: *Deleted

## 2016-09-13 DIAGNOSIS — I482 Chronic atrial fibrillation, unspecified: Secondary | ICD-10-CM

## 2016-09-13 DIAGNOSIS — I4891 Unspecified atrial fibrillation: Secondary | ICD-10-CM

## 2016-09-13 DIAGNOSIS — Z5181 Encounter for therapeutic drug level monitoring: Secondary | ICD-10-CM | POA: Diagnosis not present

## 2016-09-13 LAB — POCT INR: INR: 3.7

## 2016-09-28 ENCOUNTER — Ambulatory Visit (INDEPENDENT_AMBULATORY_CARE_PROVIDER_SITE_OTHER): Payer: Medicare Other

## 2016-09-28 DIAGNOSIS — I482 Chronic atrial fibrillation, unspecified: Secondary | ICD-10-CM

## 2016-09-28 DIAGNOSIS — I4891 Unspecified atrial fibrillation: Secondary | ICD-10-CM

## 2016-09-28 DIAGNOSIS — Z5181 Encounter for therapeutic drug level monitoring: Secondary | ICD-10-CM | POA: Diagnosis not present

## 2016-09-28 LAB — POCT INR: INR: 3

## 2016-10-19 ENCOUNTER — Encounter (INDEPENDENT_AMBULATORY_CARE_PROVIDER_SITE_OTHER): Payer: Self-pay

## 2016-10-19 ENCOUNTER — Ambulatory Visit (INDEPENDENT_AMBULATORY_CARE_PROVIDER_SITE_OTHER): Payer: Medicare Other | Admitting: *Deleted

## 2016-10-19 DIAGNOSIS — I4891 Unspecified atrial fibrillation: Secondary | ICD-10-CM | POA: Diagnosis not present

## 2016-10-19 DIAGNOSIS — Z5181 Encounter for therapeutic drug level monitoring: Secondary | ICD-10-CM | POA: Diagnosis not present

## 2016-10-19 DIAGNOSIS — I482 Chronic atrial fibrillation, unspecified: Secondary | ICD-10-CM

## 2016-10-19 LAB — POCT INR: INR: 2.2

## 2016-10-27 ENCOUNTER — Other Ambulatory Visit: Payer: Self-pay | Admitting: Cardiology

## 2016-11-06 NOTE — Progress Notes (Signed)
Cardiology Office Note Date:  11/07/2016  Patient ID:  Jerry, Wood 1921-10-16, MRN [id number] PCP:  Josetta Huddle, MD  Cardiologist:  Dr. Radford Pax Electrophysiologist: Dr. Rayann Heman   Chief Complaint: annual visit  History of Present Illness: Jerry Wood is a 83 y.o. male with history of tachy-brady syndrome w/PPM, HTN, COPD, Parkinson's, permanent Afib, CAD w/PCI to RCA "years ago".  He comes in today to be seen for Dr. Rayann Heman.  Last seen by EP service by Chanetta Marshall, NP in Feb 2017, at that time was primarily wheelchair bound, chronic LE swelling improved with compression hose, no changes were made to his tx.  The patient comes accompanied by his daughter who he lives with.  He is doing well, wheelchair bound, is non-ambulatory essentially, able to take a few steps to transfer.  He denies any CP, palpitations or SOB, he tells me he sleeps comfortably with on pillow, no symptoms of PND orrthopea.  He denies any bleeding or signs of bleeding.  They report his PMD does labs routinely, at least bi-annually if not more often.   Device History: BSX dual chamber PPM implanted 2003 for tachy-brady syndrome; gen change 2010 Programmed VVIR  Past Medical History:  Diagnosis Date  . Allergic rhinitis   . Benign hypertensive heart disease without heart failure   . Cancer ([place]) 09/2015   skin ,basal cell  . Chronic knee pain   . Constipation   . COPD, mild ([place])   . Coronary artery disease    s/p PCI of the RCA  . Diverticulosis   . Dyslipidemia   . Gout   . Hypertension   . Long term (current) use of anticoagulants   . Onychomycosis   . 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 Parkinson's disease ([place]) 03/2012   Dr Vilinda Blanks started  . Permanent atrial fibrillation (HCC)    Chronic. ECHO 06/04/11 LVEF estimated by 2D at 52.1%  . Pure hypercholesterolemia   .  Seborrheic keratosis, inflamed   . Skin lesion    Plan to remove left posterior ear lesion with electrocautery  . Tachy-brady syndrome [facility])    s/p permanent pacemaker placement  . Unspecified hypothyroidism   . Vertigo   . Vitamin D deficiency     Past Surgical History:  Procedure Laterality Date  . FRACTURE SURGERY Right    clavicle  . INGUINAL HERNIA REPAIR Bilateral   . LAPAROSCOPIC PARTIAL COLECTOMY     Partial resection of sigmoid colon  . PACEMAKER PLACEMENT  2003   Tachybradycardia syndrome. Gen change 07/29/08, Dr. Leonia Reeves  . TYMPANOPLASTY Bilateral     Current Outpatient Prescriptions  Medication Sig Dispense Refill  . carbidopa-levodopa (SINEMET IR) 25-100 MG tablet Take 2 tablets by mouth 3 (three) times daily. 540 tablet 4  . CARTIA XT 120 MG 24 hr capsule TAKE 1 CAPSULE(120 MG) BY MOUTH DAILY 90 capsule 2  . Cholecalciferol (VITAMIN D3) 2000 UNITS TABS Take 2,000 Units by mouth daily.    Marland Kitchen ezetimibe (ZETIA) 10 MG tablet TAKE 1 TABLET BY MOUTH DAILY 30 tablet 5  . fexofenadine (ALLEGRA) 60 MG tablet Take 60 mg by mouth daily as needed for allergies.     . furosemide (LASIX) 80 MG tablet Take 80 mg by mouth 2 (two) times daily.     Marland Kitchen levothyroxine (SYNTHROID, LEVOTHROID) 75 MCG tablet Take 75 mcg by mouth daily before breakfast.     .  Multiple Vitamin (MULTIVITAMIN) capsule Take 1 capsule by mouth daily.    . Multiple Vitamins-Minerals (PRESERVISION AREDS) CAPS Take 1 capsule by mouth daily.     . potassium chloride SA (K-DUR,KLOR-CON) 20 MEQ tablet Take 30 mEq by mouth daily.    . pravastatin (PRAVACHOL) 40 MG tablet TAKE 1 TABLET BY MOUTH EVERY DAY 30 tablet 9  . valsartan (DIOVAN) 320 MG tablet TAKE 1 TABLET BY MOUTH EVERY DAY 90 tablet 3  . vitamin B-12 (CYANOCOBALAMIN) 1000 MCG tablet Take 1,000 mcg by mouth daily.    Marland Kitchen warfarin (COUMADIN) 1 MG tablet TAKE BY MOUTH AS DIRECTED BY COUMADIN CLINIC 60 tablet 3   No current facility-administered medications for this  visit.     Allergies:   Patient has no known allergies.   Social History:  The patient  reports that he quit smoking about 68 years ago. His smoking use included Cigarettes. He quit after 10.00 years of use. He has never used smokeless tobacco. He reports that he does not drink alcohol or use drugs.   Family History:  The patient's family history includes CAD in his father; Diabetes in his father.  ROS:  Please see the history of present illness.   All other systems are reviewed and otherwise negative.   PHYSICAL EXAM:  VS:  BP 104/65   Pulse 65   Ht 5\' 1"  (1.549 m)   Wt 170 lb (77.1 kg)   BMI 32.12 kg/m  BMI: Body mass index is 32.12 kg/m. Well nourished, well developed, elderly in no acute distress  HEENT: normocephalic, atraumatic  Neck: no JVD, carotid bruits or masses Cardiac:  IRRR; no significant murmurs, no rubs, or gallops Lungs:  CTA b/l, no wheezing, rhonchi or rales  Abd: soft, nontender, obese MS: no deformity, age appropriate atrophy Ext:  1+ LE edema, L slightly > R (always per patient/daughter)   Skin: warm and dry, no rash Neuro:  No gross deficits appreciated Psych: euthymic mood, full affect  PPM site is stable, no tethering or discomfort   EKG:  Done today and reviewed by myself shows AFib, 75bpm, some V paced/fused beats PPM interrogation done today by industry and reviewed by myself: battery and lead measurements are good, one HVR is AF, programmed VVIR  02/01/15: TTE Study Conclusions - Left ventricle: The cavity size was normal. Wall thickness was   increased in a pattern of mild LVH. Systolic function was normal.   The estimated ejection fraction was in the range of 55% to 60%.   Wall motion was normal; there were no regional wall motion   abnormalities. - Mitral valve: There was mild regurgitation. - Left atrium: The atrium was severely dilated. - Right atrium: The atrium was mildly dilated. - Pulmonary arteries: Systolic pressure was mildly  increased. Impressions: - Normal LV systolic function; severe LAE; mild RAE; mild MR and   TR; mildly elevated pulmonary pressure.  Recent Labs: 03/16/2016: ALT 13; BUN 22; Creat 1.34; Potassium 4.7; Sodium 143  03/16/2016: Cholesterol 138; HDL 36; LDL Cholesterol 75; Total CHOL/HDL Ratio 3.8; Triglycerides 137; VLDL 27   CrCl cannot be calculated (Patient's most recent lab result is older than the maximum 21 days allowed.).   Wt Readings from Last 3 Encounters:  11/07/16 170 lb (77.1 kg)  03/16/16 169 lb (76.7 kg)  09/12/15 173 lb (78.5 kg)     Other studies reviewed: Additional studies/records reviewed today include: summarized above  ASSESSMENT AND PLAN:  1. Tachy-brady syndrome w/PPM  Stable device function      Continue Q 3 month remote checks  2. Permanent Afib     CHA2DS2Vasc is at least 4, on warfarin, monitored and managed with the coumadin clinic  3. HTN     Looks OK, no changes  4. CAD     No symptoms     On statin, no ASA given warfarin     Follows with dr. Radford Pax  Disposition: F/u with 3 month remote check, Dr. Rayann Heman in 1 year, sooner if needed  Current medicines are reviewed at length with the patient today.  The patient did not have any concerns regarding medicines.  Haywood Lasso, PA-C 11/07/2016 9:08 AM     [address] [place] [address] [phone] (office)  [phone] (fax)

## 2016-11-07 ENCOUNTER — Ambulatory Visit (INDEPENDENT_AMBULATORY_CARE_PROVIDER_SITE_OTHER): Payer: Medicare Other | Admitting: Physician Assistant

## 2016-11-07 ENCOUNTER — Encounter (INDEPENDENT_AMBULATORY_CARE_PROVIDER_SITE_OTHER): Payer: Self-pay

## 2016-11-07 ENCOUNTER — Ambulatory Visit (INDEPENDENT_AMBULATORY_CARE_PROVIDER_SITE_OTHER): Payer: Medicare Other | Admitting: *Deleted

## 2016-11-07 VITALS — BP 104/65 | HR 65 | Ht 61.0 in | Wt 170.0 lb

## 2016-11-07 DIAGNOSIS — Z950 Presence of cardiac pacemaker: Secondary | ICD-10-CM | POA: Diagnosis not present

## 2016-11-07 DIAGNOSIS — I482 Chronic atrial fibrillation, unspecified: Secondary | ICD-10-CM

## 2016-11-07 DIAGNOSIS — I4891 Unspecified atrial fibrillation: Secondary | ICD-10-CM

## 2016-11-07 DIAGNOSIS — I10 Essential (primary) hypertension: Secondary | ICD-10-CM | POA: Diagnosis not present

## 2016-11-07 DIAGNOSIS — I2510 Atherosclerotic heart disease of native coronary artery without angina pectoris: Secondary | ICD-10-CM | POA: Diagnosis not present

## 2016-11-07 DIAGNOSIS — Z5181 Encounter for therapeutic drug level monitoring: Secondary | ICD-10-CM

## 2016-11-07 LAB — POCT INR: INR: 2.7

## 2016-11-07 NOTE — Patient Instructions (Signed)
Medication Instructions:   Your physician recommends that you continue on your current medications as directed. Please refer to the Current Medication list given to you today.   If you need a refill on your cardiac medications before your next appointment, please call your pharmacy.  Labwork: NONE ORDERED  TODAY    Testing/Procedures: NONE ORDERED  TODAY    Follow-Up:  Your physician wants you to follow-up in: ONE YEAR WITH ALLRED You will receive a reminder letter in the mail two months in advance. If you don't receive a letter, please call our office to schedule the follow-up appointment.      Any Other Special Instructions Will Be Listed Below (If Applicable).                                                                                                                                                   

## 2016-11-26 ENCOUNTER — Other Ambulatory Visit: Payer: Self-pay | Admitting: Cardiology

## 2016-11-29 ENCOUNTER — Other Ambulatory Visit: Payer: Self-pay | Admitting: Diagnostic Neuroimaging

## 2016-11-30 ENCOUNTER — Encounter: Payer: Self-pay | Admitting: Internal Medicine

## 2016-11-30 DIAGNOSIS — I495 Sick sinus syndrome: Secondary | ICD-10-CM | POA: Diagnosis not present

## 2016-12-05 ENCOUNTER — Ambulatory Visit (INDEPENDENT_AMBULATORY_CARE_PROVIDER_SITE_OTHER): Payer: Medicare Other | Admitting: *Deleted

## 2016-12-05 DIAGNOSIS — I482 Chronic atrial fibrillation, unspecified: Secondary | ICD-10-CM

## 2016-12-05 DIAGNOSIS — I4891 Unspecified atrial fibrillation: Secondary | ICD-10-CM

## 2016-12-05 DIAGNOSIS — Z5181 Encounter for therapeutic drug level monitoring: Secondary | ICD-10-CM

## 2016-12-05 LAB — POCT INR: INR: 2.7

## 2017-01-08 DIAGNOSIS — E039 Hypothyroidism, unspecified: Secondary | ICD-10-CM | POA: Diagnosis not present

## 2017-01-08 DIAGNOSIS — I509 Heart failure, unspecified: Secondary | ICD-10-CM | POA: Diagnosis not present

## 2017-01-08 DIAGNOSIS — E559 Vitamin D deficiency, unspecified: Secondary | ICD-10-CM | POA: Diagnosis not present

## 2017-01-08 DIAGNOSIS — J309 Allergic rhinitis, unspecified: Secondary | ICD-10-CM | POA: Diagnosis not present

## 2017-01-08 DIAGNOSIS — M109 Gout, unspecified: Secondary | ICD-10-CM | POA: Diagnosis not present

## 2017-01-08 DIAGNOSIS — I2510 Atherosclerotic heart disease of native coronary artery without angina pectoris: Secondary | ICD-10-CM | POA: Diagnosis not present

## 2017-01-09 ENCOUNTER — Ambulatory Visit (INDEPENDENT_AMBULATORY_CARE_PROVIDER_SITE_OTHER): Payer: Medicare Other | Admitting: *Deleted

## 2017-01-09 ENCOUNTER — Encounter (INDEPENDENT_AMBULATORY_CARE_PROVIDER_SITE_OTHER): Payer: Self-pay

## 2017-01-09 DIAGNOSIS — I482 Chronic atrial fibrillation, unspecified: Secondary | ICD-10-CM

## 2017-01-09 DIAGNOSIS — I4891 Unspecified atrial fibrillation: Secondary | ICD-10-CM | POA: Diagnosis not present

## 2017-01-09 DIAGNOSIS — Z5181 Encounter for therapeutic drug level monitoring: Secondary | ICD-10-CM

## 2017-01-09 LAB — POCT INR: INR: 1.7

## 2017-01-21 DIAGNOSIS — I509 Heart failure, unspecified: Secondary | ICD-10-CM | POA: Diagnosis not present

## 2017-01-21 DIAGNOSIS — I2510 Atherosclerotic heart disease of native coronary artery without angina pectoris: Secondary | ICD-10-CM | POA: Diagnosis not present

## 2017-01-21 DIAGNOSIS — G20 Parkinson's disease: Secondary | ICD-10-CM | POA: Diagnosis not present

## 2017-01-21 DIAGNOSIS — I110 Hypertensive heart disease with heart failure: Secondary | ICD-10-CM | POA: Diagnosis not present

## 2017-01-21 DIAGNOSIS — J449 Chronic obstructive pulmonary disease, unspecified: Secondary | ICD-10-CM | POA: Diagnosis not present

## 2017-01-21 DIAGNOSIS — I482 Chronic atrial fibrillation: Secondary | ICD-10-CM | POA: Diagnosis not present

## 2017-01-23 ENCOUNTER — Ambulatory Visit (INDEPENDENT_AMBULATORY_CARE_PROVIDER_SITE_OTHER): Payer: Medicare Other

## 2017-01-23 DIAGNOSIS — I4891 Unspecified atrial fibrillation: Secondary | ICD-10-CM | POA: Diagnosis not present

## 2017-01-23 DIAGNOSIS — Z5181 Encounter for therapeutic drug level monitoring: Secondary | ICD-10-CM

## 2017-01-23 DIAGNOSIS — I482 Chronic atrial fibrillation, unspecified: Secondary | ICD-10-CM

## 2017-01-23 LAB — POCT INR: INR: 2.5

## 2017-02-13 ENCOUNTER — Ambulatory Visit (INDEPENDENT_AMBULATORY_CARE_PROVIDER_SITE_OTHER): Payer: Medicare Other | Admitting: *Deleted

## 2017-02-13 DIAGNOSIS — Z5181 Encounter for therapeutic drug level monitoring: Secondary | ICD-10-CM

## 2017-02-13 DIAGNOSIS — I4891 Unspecified atrial fibrillation: Secondary | ICD-10-CM | POA: Diagnosis not present

## 2017-02-13 DIAGNOSIS — I482 Chronic atrial fibrillation, unspecified: Secondary | ICD-10-CM

## 2017-02-13 LAB — POCT INR: INR: 2.3

## 2017-02-15 ENCOUNTER — Other Ambulatory Visit: Payer: Self-pay | Admitting: Cardiology

## 2017-03-01 ENCOUNTER — Encounter: Payer: Self-pay | Admitting: Internal Medicine

## 2017-03-01 DIAGNOSIS — I495 Sick sinus syndrome: Secondary | ICD-10-CM | POA: Diagnosis not present

## 2017-03-13 ENCOUNTER — Ambulatory Visit (INDEPENDENT_AMBULATORY_CARE_PROVIDER_SITE_OTHER): Payer: Medicare Other | Admitting: *Deleted

## 2017-03-13 DIAGNOSIS — Z5181 Encounter for therapeutic drug level monitoring: Secondary | ICD-10-CM

## 2017-03-13 DIAGNOSIS — I482 Chronic atrial fibrillation, unspecified: Secondary | ICD-10-CM

## 2017-03-13 DIAGNOSIS — I4891 Unspecified atrial fibrillation: Secondary | ICD-10-CM | POA: Diagnosis not present

## 2017-03-13 LAB — POCT INR: INR: 1.9

## 2017-03-17 ENCOUNTER — Other Ambulatory Visit: Payer: Self-pay | Admitting: Cardiology

## 2017-03-20 ENCOUNTER — Other Ambulatory Visit: Payer: Self-pay | Admitting: Cardiology

## 2017-03-27 DIAGNOSIS — Z23 Encounter for immunization: Secondary | ICD-10-CM | POA: Diagnosis not present

## 2017-04-10 ENCOUNTER — Ambulatory Visit (INDEPENDENT_AMBULATORY_CARE_PROVIDER_SITE_OTHER): Payer: Medicare Other | Admitting: *Deleted

## 2017-04-10 DIAGNOSIS — I482 Chronic atrial fibrillation, unspecified: Secondary | ICD-10-CM

## 2017-04-10 DIAGNOSIS — I4891 Unspecified atrial fibrillation: Secondary | ICD-10-CM

## 2017-04-10 DIAGNOSIS — Z5181 Encounter for therapeutic drug level monitoring: Secondary | ICD-10-CM | POA: Diagnosis not present

## 2017-04-10 LAB — POCT INR: INR: 1.9

## 2017-04-16 ENCOUNTER — Ambulatory Visit (INDEPENDENT_AMBULATORY_CARE_PROVIDER_SITE_OTHER): Payer: Medicare Other | Admitting: Cardiology

## 2017-04-16 ENCOUNTER — Encounter: Payer: Self-pay | Admitting: Cardiology

## 2017-04-16 ENCOUNTER — Other Ambulatory Visit: Payer: Self-pay | Admitting: Pharmacist

## 2017-04-16 VITALS — BP 90/60 | HR 88 | Resp 16 | Wt 168.0 lb

## 2017-04-16 DIAGNOSIS — I482 Chronic atrial fibrillation, unspecified: Secondary | ICD-10-CM

## 2017-04-16 DIAGNOSIS — I2510 Atherosclerotic heart disease of native coronary artery without angina pectoris: Secondary | ICD-10-CM | POA: Diagnosis not present

## 2017-04-16 DIAGNOSIS — I10 Essential (primary) hypertension: Secondary | ICD-10-CM | POA: Diagnosis not present

## 2017-04-16 MED ORDER — WARFARIN SODIUM 1 MG PO TABS: ORAL_TABLET | ORAL | 1 refills | Status: DC

## 2017-04-16 NOTE — Addendum Note (Signed)
Addended by: Stanton Kidney on: 04/16/2017 03:35 PM   Modules accepted: Orders

## 2017-04-16 NOTE — Progress Notes (Signed)
04/16/2017 Jerry Wood   12-29-1921  [id number]  Primary Physician Josetta Huddle, MD Primary Cardiologist: Dr. Radford Pax  Electrophysiologist: Dr. Rayann Heman   Reason for Visit/CC: f/u for CAD and chronic afib.   HPI:  Jerry Wood is a 83 y.o. male who is being seen today for routine f/u. He is followed by Dr. Radford Pax and Dr. Rayann Heman. He has a h/o CAD s/p  Remote PCI of the RCA.  He also has a history of chronic atrial fibrillation that is rate controlled.  He is on Cardizem for rate control and Coumadin for anticoagulation.  He also has a history of tachybrady syndrome, status post permanent pacemaker followed in device clinic as well as history of hypertension and hyperlipidemia.  He presents today for general cardiology follow-up.  He reports that he has done well since his last office visit.  He denies any chest pain or dyspnea.  He reports full medication compliance.  Blood pressure is soft but stable at 90/60.  He reports that this is his baseline.  He denies any symptoms of fatigue, dizziness, weakness, syncope/near syncope.  He gets around with use of a wheelchair but he has assistance at home.  He denies any falls and no abnormal bleeding.  His INR is followed in our Coumadin clinic.  He reports that his PCP Dr. Josetta Huddle recently checked basic laboratory work and he believes that his cholesterol was checked at that time.  We will obtain these records from his office.  Current Meds  Medication Sig  . carbidopa-levodopa (SINEMET IR) 25-100 MG tablet Take 2 tablets by mouth 3 (three) times daily.  Marland Kitchen CARTIA XT 120 MG 24 hr capsule TAKE 1 CAPSULE(120 MG) BY MOUTH DAILY  . Cholecalciferol (VITAMIN D3) 2000 UNITS TABS Take 2,000 Units by mouth daily.  Marland Kitchen ezetimibe (ZETIA) 10 MG tablet TAKE 1 TABLET BY MOUTH DAILY  . fexofenadine (ALLEGRA) 60 MG tablet Take 60 mg by mouth daily as needed for allergies.   . furosemide (LASIX) 80 MG tablet Take 80 mg by mouth 2 (two) times daily.   Marland Kitchen  levothyroxine (SYNTHROID, LEVOTHROID) 75 MCG tablet Take 75 mcg by mouth daily before breakfast.   . Multiple Vitamin (MULTIVITAMIN) capsule Take 1 capsule by mouth daily.  . Multiple Vitamins-Minerals (PRESERVISION AREDS) CAPS Take 1 capsule by mouth daily.   . potassium chloride SA (K-DUR,KLOR-CON) 20 MEQ tablet Take 30 mEq by mouth daily.  . pravastatin (PRAVACHOL) 40 MG tablet TAKE 1 TABLET BY MOUTH EVERY DAY  . valsartan (DIOVAN) 320 MG tablet TAKE 1 TABLET BY MOUTH EVERY DAY  . vitamin B-12 (CYANOCOBALAMIN) 1000 MCG tablet Take 1,000 mcg by mouth daily.  Marland Kitchen warfarin (COUMADIN) 1 MG tablet TAKE BY MOUTH AS DIRECTED BY COUMADIN CLINIC   No Known Allergies Past Medical History:  Diagnosis Date  . Allergic rhinitis   . Benign hypertensive heart disease without heart failure   . Cancer ([place]) 09/2015   skin ,basal cell  . Chronic knee pain   . Constipation   . COPD, mild ([place])   . Coronary artery disease    s/p PCI of the RCA  . Diverticulosis   . Dyslipidemia   . Gout   . Hypertension   . Long term (current) use of anticoagulants   . Onychomycosis   . 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 Parkinson's disease ([place]) 03/2012   Dr  Penumalli-Sinemet started  . Permanent atrial fibrillation (HCC)    Chronic. ECHO 06/04/11 LVEF estimated by 2D at 52.1%  . Pure hypercholesterolemia   . Seborrheic keratosis, inflamed   . Skin lesion    Plan to remove left posterior ear lesion with electrocautery  . Tachy-brady syndrome [facility])    s/p permanent pacemaker placement  . Unspecified hypothyroidism   . Vertigo   . Vitamin D deficiency    Family History  Problem Relation Age of Onset  . CAD Father   . Diabetes Father   . Bladder Cancer Unknown   . Leukemia Unknown    Past Surgical History:  Procedure Laterality Date  . FRACTURE SURGERY Right    clavicle  . INGUINAL HERNIA  REPAIR Bilateral   . LAPAROSCOPIC PARTIAL COLECTOMY     Partial resection of sigmoid colon  . PACEMAKER PLACEMENT  2003   Tachybradycardia syndrome. Gen change 07/29/08, Dr. Leonia Reeves  . TYMPANOPLASTY Bilateral    Social History   Social History  . Marital status: Widowed    Spouse name: N/A  . Number of children: 2  . Years of education: 7TH   Occupational History  . Not on file.   Social History Main Topics  . Smoking status: Former Smoker    Years: 10.00    Types: Cigarettes    Quit date: 06/18/1948  . Smokeless tobacco: Never Used  . Alcohol use No  . Drug use: No  . Sexual activity: Not on file   Other Topics Concern  . Not on file   Social History Narrative   Patient lives at home with family.   Caffeine Use: rarely     Review of Systems: General: negative for chills, fever, night sweats or weight changes.  Cardiovascular: negative for chest pain, dyspnea on exertion, edema, orthopnea, palpitations, paroxysmal nocturnal dyspnea or shortness of breath Dermatological: negative for rash Respiratory: negative for cough or wheezing Urologic: negative for hematuria Abdominal: negative for nausea, vomiting, diarrhea, bright red blood per rectum, melena, or hematemesis Neurologic: negative for visual changes, syncope, or dizziness All other systems reviewed and are otherwise negative except as noted above.   Physical Exam:  Blood pressure 90/60, pulse 88, resp. rate 16, weight 168 lb (76.2 kg), SpO2 96 %.  General appearance: alert, cooperative and no distress Neck: no carotid bruit and no JVD Lungs: clear to auscultation bilaterally Heart: regular rate and rhythm, S1, S2 normal, no murmur, click, rub or gallop Extremities: extremities normal, atraumatic, no cyanosis or edema Pulses: 2+ and symmetric Skin: Skin color, texture, turgor normal. No rashes or lesions Neurologic: Grossly normal  EKG not performed  -- personally reviewed   ASSESSMENT AND PLAN:   1.  CAD: remote PCI of the RCA. Stable w/o anginal symptoms. Continue medical therapy.   2. Chronic Afib: rate controlled on Cardizem. On chronic coumadin for a/c. INR followed in our coumadin clinic. Stable w/o falls and no abnormal bleeding.   3. Tachybrady syndrome; s/p PPM. Followed by Dr. Rayann Heman.   4. HTN: controlled on current regimen.   5. HLD: on statin therapy. Recently checked by PCP. Will contact PCP office to have labs faxed to [place].    Follow-Up w/ Dr. Radford Pax in 1 year or sooner if needed. F/u with Dr. Rayann Heman as directed.   Candiace West Ladoris Gene, MHS [facility] 04/16/2017 1:39 PM

## 2017-04-16 NOTE — Patient Instructions (Addendum)
Medication Instructions:  Your physician recommends that you continue on your current medications as directed. Please refer to the Current Medication list given to you today.  -- If you need a refill on your cardiac medications before your next appointment, please call your pharmacy. --  Labwork: You will have lipid profile when you come in for your Coumadin clinic appointment on 11/7.  You will need to be fasting for this blood work.  Testing/Procedures: None ordered  Follow-Up: Your physician wants you to follow-up in: 1 year with Dr. Radford Pax.  You will receive a reminder letter in the mail two months in advance. If you don't receive a letter, please call our office to schedule the follow-up appointment.    Thank you for choosing [facility]!!    Any Other Special Instructions Will Be Listed Below (If Applicable).

## 2017-04-17 ENCOUNTER — Other Ambulatory Visit: Payer: Self-pay | Admitting: Cardiology

## 2017-04-24 ENCOUNTER — Ambulatory Visit (INDEPENDENT_AMBULATORY_CARE_PROVIDER_SITE_OTHER): Payer: Medicare Other | Admitting: *Deleted

## 2017-04-24 ENCOUNTER — Other Ambulatory Visit: Payer: Medicare Other

## 2017-04-24 DIAGNOSIS — I4891 Unspecified atrial fibrillation: Secondary | ICD-10-CM

## 2017-04-24 DIAGNOSIS — I482 Chronic atrial fibrillation, unspecified: Secondary | ICD-10-CM

## 2017-04-24 DIAGNOSIS — I2510 Atherosclerotic heart disease of native coronary artery without angina pectoris: Secondary | ICD-10-CM

## 2017-04-24 DIAGNOSIS — Z5181 Encounter for therapeutic drug level monitoring: Secondary | ICD-10-CM

## 2017-04-24 LAB — LIPID PANEL
CHOL/HDL RATIO: 3.8 ratio (ref 0.0–5.0)
Cholesterol, Total: 148 mg/dL (ref 100–199)
HDL: 39 mg/dL — ABNORMAL LOW (ref >39)
LDL Calculated: 74 mg/dL (ref 0–99)
TRIGLYCERIDES: 177 mg/dL — AB (ref 0–149)
VLDL Cholesterol Cal: 35 mg/dL (ref 5–40)

## 2017-04-24 LAB — POCT INR: INR: 2.7

## 2017-05-15 ENCOUNTER — Ambulatory Visit (INDEPENDENT_AMBULATORY_CARE_PROVIDER_SITE_OTHER): Payer: Medicare Other | Admitting: *Deleted

## 2017-05-15 DIAGNOSIS — Z5181 Encounter for therapeutic drug level monitoring: Secondary | ICD-10-CM | POA: Diagnosis not present

## 2017-05-15 DIAGNOSIS — I4891 Unspecified atrial fibrillation: Secondary | ICD-10-CM

## 2017-05-15 DIAGNOSIS — I482 Chronic atrial fibrillation, unspecified: Secondary | ICD-10-CM

## 2017-05-15 LAB — POCT INR: INR: 3.1

## 2017-05-15 NOTE — Patient Instructions (Signed)
Today Nov 28th take 1 tablet then continue same dose of coumadin   2 tablets daily except 1 tablet on Tuesdays and Thursdays .  Recheck INR in 3 weeks. Call if scheduled for any procedures that needs to hold coumadin [phone] or has any new medications

## 2017-05-16 ENCOUNTER — Telehealth: Payer: Self-pay | Admitting: Student-PharmD

## 2017-05-16 NOTE — Telephone Encounter (Signed)
Received letter from insurance concerning valsartan recall, per [place] patient has been changed to losartan 100mg  daily. Med list updated.

## 2017-05-31 DIAGNOSIS — I495 Sick sinus syndrome: Secondary | ICD-10-CM | POA: Diagnosis not present

## 2017-06-06 ENCOUNTER — Ambulatory Visit (INDEPENDENT_AMBULATORY_CARE_PROVIDER_SITE_OTHER): Payer: Medicare Other

## 2017-06-06 DIAGNOSIS — Z5181 Encounter for therapeutic drug level monitoring: Secondary | ICD-10-CM

## 2017-06-06 DIAGNOSIS — I4891 Unspecified atrial fibrillation: Secondary | ICD-10-CM | POA: Diagnosis not present

## 2017-06-06 DIAGNOSIS — I482 Chronic atrial fibrillation, unspecified: Secondary | ICD-10-CM

## 2017-06-06 LAB — POCT INR: INR: 3

## 2017-06-06 NOTE — Patient Instructions (Signed)
Start taking 2 tablets daily except 1 tablet on Tuesdays, Thursdays, and Saturdays.  Recheck INR in 3 weeks. Call if scheduled for any procedures that needs to hold coumadin [phone] or has any new medications

## 2017-06-27 ENCOUNTER — Ambulatory Visit (INDEPENDENT_AMBULATORY_CARE_PROVIDER_SITE_OTHER): Payer: Medicare Other | Admitting: *Deleted

## 2017-06-27 DIAGNOSIS — I482 Chronic atrial fibrillation, unspecified: Secondary | ICD-10-CM

## 2017-06-27 DIAGNOSIS — I4891 Unspecified atrial fibrillation: Secondary | ICD-10-CM | POA: Diagnosis not present

## 2017-06-27 DIAGNOSIS — Z5181 Encounter for therapeutic drug level monitoring: Secondary | ICD-10-CM

## 2017-06-27 LAB — POCT INR: INR: 2.2

## 2017-06-27 NOTE — Patient Instructions (Signed)
Description   Continue taking 2 tablets daily except 1 tablet on Tuesdays, Thursdays, and Saturdays.  Recheck INR in 4 weeks. Call if scheduled for any procedures that needs to hold coumadin [phone] or has any new medications

## 2017-07-05 ENCOUNTER — Other Ambulatory Visit: Payer: Self-pay | Admitting: Internal Medicine

## 2017-07-26 ENCOUNTER — Ambulatory Visit (INDEPENDENT_AMBULATORY_CARE_PROVIDER_SITE_OTHER): Payer: Medicare Other | Admitting: Pharmacist

## 2017-07-26 DIAGNOSIS — Z5181 Encounter for therapeutic drug level monitoring: Secondary | ICD-10-CM

## 2017-07-26 DIAGNOSIS — I482 Chronic atrial fibrillation, unspecified: Secondary | ICD-10-CM

## 2017-07-26 DIAGNOSIS — I4891 Unspecified atrial fibrillation: Secondary | ICD-10-CM

## 2017-07-26 LAB — POCT INR: INR: 1.6

## 2017-07-26 NOTE — Patient Instructions (Signed)
Description   Take 2.5 tablets today and 1.5 tablets tomorrow, then continue taking 2 tablets daily except 1 tablet on Tuesdays, Thursdays, and Saturdays.  Recheck INR in 2 weeks. Call if scheduled for any procedures that needs to hold coumadin [phone] or has any new medications

## 2017-08-14 ENCOUNTER — Ambulatory Visit (INDEPENDENT_AMBULATORY_CARE_PROVIDER_SITE_OTHER): Payer: Medicare Other | Admitting: *Deleted

## 2017-08-14 DIAGNOSIS — I482 Chronic atrial fibrillation, unspecified: Secondary | ICD-10-CM

## 2017-08-14 DIAGNOSIS — Z5181 Encounter for therapeutic drug level monitoring: Secondary | ICD-10-CM

## 2017-08-14 DIAGNOSIS — I4891 Unspecified atrial fibrillation: Secondary | ICD-10-CM | POA: Diagnosis not present

## 2017-08-14 LAB — POCT INR: INR: 1.9

## 2017-08-14 NOTE — Patient Instructions (Signed)
Description   Today take 2.5 tablets, then start taking  2 tablets daily except 1 tablet on Tuesdays  and Saturdays.  Recheck INR in 2 weeks. Call if scheduled for any procedures that needs to hold coumadin [phone] or has any new medications

## 2017-08-23 DIAGNOSIS — I509 Heart failure, unspecified: Secondary | ICD-10-CM | POA: Diagnosis not present

## 2017-08-23 DIAGNOSIS — E785 Hyperlipidemia, unspecified: Secondary | ICD-10-CM | POA: Diagnosis not present

## 2017-08-23 DIAGNOSIS — M109 Gout, unspecified: Secondary | ICD-10-CM | POA: Diagnosis not present

## 2017-08-23 DIAGNOSIS — I2510 Atherosclerotic heart disease of native coronary artery without angina pectoris: Secondary | ICD-10-CM | POA: Diagnosis not present

## 2017-08-28 ENCOUNTER — Ambulatory Visit (INDEPENDENT_AMBULATORY_CARE_PROVIDER_SITE_OTHER): Payer: Medicare Other | Admitting: *Deleted

## 2017-08-28 DIAGNOSIS — I482 Chronic atrial fibrillation, unspecified: Secondary | ICD-10-CM

## 2017-08-28 DIAGNOSIS — Z5181 Encounter for therapeutic drug level monitoring: Secondary | ICD-10-CM

## 2017-08-28 DIAGNOSIS — I4891 Unspecified atrial fibrillation: Secondary | ICD-10-CM | POA: Diagnosis not present

## 2017-08-28 LAB — POCT INR: INR: 2.8

## 2017-08-28 NOTE — Patient Instructions (Signed)
Description   Continue  taking  2 tablets daily except 1 tablet on Tuesdays  and Saturdays.  Recheck INR in 3 weeks. Call if scheduled for any procedures that needs to hold coumadin [phone] or has any new medications

## 2017-08-30 ENCOUNTER — Encounter: Payer: Self-pay | Admitting: Internal Medicine

## 2017-08-30 DIAGNOSIS — I495 Sick sinus syndrome: Secondary | ICD-10-CM | POA: Diagnosis not present

## 2017-09-12 DIAGNOSIS — I2510 Atherosclerotic heart disease of native coronary artery without angina pectoris: Secondary | ICD-10-CM | POA: Diagnosis not present

## 2017-09-12 DIAGNOSIS — E782 Mixed hyperlipidemia: Secondary | ICD-10-CM | POA: Diagnosis not present

## 2017-09-12 DIAGNOSIS — I10 Essential (primary) hypertension: Secondary | ICD-10-CM | POA: Diagnosis not present

## 2017-09-12 DIAGNOSIS — H5211 Myopia, right eye: Secondary | ICD-10-CM | POA: Diagnosis not present

## 2017-09-12 DIAGNOSIS — J449 Chronic obstructive pulmonary disease, unspecified: Secondary | ICD-10-CM | POA: Diagnosis not present

## 2017-09-12 DIAGNOSIS — Z961 Presence of intraocular lens: Secondary | ICD-10-CM | POA: Diagnosis not present

## 2017-09-12 DIAGNOSIS — E7800 Pure hypercholesterolemia, unspecified: Secondary | ICD-10-CM | POA: Diagnosis not present

## 2017-09-12 DIAGNOSIS — H353132 Nonexudative age-related macular degeneration, bilateral, intermediate dry stage: Secondary | ICD-10-CM | POA: Diagnosis not present

## 2017-09-13 ENCOUNTER — Ambulatory Visit: Payer: Medicare Other | Admitting: Diagnostic Neuroimaging

## 2017-09-18 ENCOUNTER — Ambulatory Visit: Payer: Medicare Other | Admitting: *Deleted

## 2017-09-18 DIAGNOSIS — I482 Chronic atrial fibrillation, unspecified: Secondary | ICD-10-CM

## 2017-09-18 DIAGNOSIS — I4891 Unspecified atrial fibrillation: Secondary | ICD-10-CM | POA: Diagnosis not present

## 2017-09-18 DIAGNOSIS — Z5181 Encounter for therapeutic drug level monitoring: Secondary | ICD-10-CM

## 2017-09-18 LAB — POCT INR: INR: 2.4

## 2017-09-18 NOTE — Patient Instructions (Signed)
Description   Continue  taking  2 tablets daily except 1 tablet on Tuesdays  and Saturdays.  Recheck INR in 4 weeks. Call if scheduled for any procedures that needs to hold coumadin [phone] or has any new medications

## 2017-09-20 ENCOUNTER — Other Ambulatory Visit: Payer: Self-pay | Admitting: Diagnostic Neuroimaging

## 2017-09-24 ENCOUNTER — Ambulatory Visit: Payer: Medicare Other | Admitting: Diagnostic Neuroimaging

## 2017-09-24 ENCOUNTER — Encounter: Payer: Self-pay | Admitting: Diagnostic Neuroimaging

## 2017-09-24 VITALS — BP 110/61 | HR 85 | Wt 158.0 lb

## 2017-09-24 DIAGNOSIS — R1310 Dysphagia, unspecified: Secondary | ICD-10-CM | POA: Diagnosis not present

## 2017-09-24 DIAGNOSIS — R269 Unspecified abnormalities of gait and mobility: Secondary | ICD-10-CM

## 2017-09-24 DIAGNOSIS — G20 Parkinson's disease: Secondary | ICD-10-CM

## 2017-09-24 MED ORDER — CARBIDOPA-LEVODOPA 25-100 MG PO TABS: 2.0000 | ORAL_TABLET | Freq: Three times a day (TID) | ORAL | 4 refills | Status: DC

## 2017-09-24 NOTE — Progress Notes (Signed)
PATIENT: Jerry Wood DOB: 05/18/1922   REASON FOR VISIT: follow up for Parkinson's disease HISTORY FROM: patient and daughter [name])  Chief Complaint  Patient presents with  . Parkinson disease    rm 7, dgtr- Dawn, "no new concerns"  . Follow-up    one year     HISTORY OF PRESENT ILLNESS:  UPDATE (09/24/17, VRP): Since last visit, doing well. Tolerating carb/levo. No alleviating or aggravating factors. No falls. No new issues.   UPDATE 09/10/16: Since last visit, doing well. No major changes. Tolerating meds. Mainly in wheelchair. No falls. Daughter helping at home. Continues with speech diff, dizziness, gait diff. No other alleviating or aggravating factors.   UPDATE 09/12/15: Since last visit, doing about the same to slightly progressing with PD. No falls. Mainly in wheelchair. Uses cane occ to transfer, but needs help. Daughter helps with ADLs.   UPDATE 03/15/15: Since last visit, patient doing well. No falls. Tolerating meds. Some swallow issues with larger pills. Doing ok with foods.  UPDATE 09/09/14: Since last visit, doing well. Tremor stable. Notes some internal shaking sensation (abdomen) without anxiety, SOB or CP. Tolerating carb/levo 25/100 2 tabs TID.   UPDATE 03/11/14 (VRP): Since last visit, doing better with tremor control on higher carb/levo (2 tabs PO TID). Able to transfer from wheelchair to bed and commode on his own. Walks short distance with walker, but very unsteady.   UPDATE 07/17/13 (LL):  Since last visit, he increased Sinemet to 1.5 tablets and then to 2 tablets TID.   Since being at 2 tablets TID, he states that sometimes it is harder for him to speak; get his words out.  Tremors are still bothersome, and and there is now akathisia with his tongue and jaw. The tremor makes it difficult for him to get to sleep, but when he does sleep, he sleeps well.  Denies dreaming or hallucinations.   UPDATE 01/14/13 (LL): Patient comes in for follow up for Parkinson's  Disease. Patient reports that his tremor is worse in both upper extremities, it makes it difficult for him to get to sleep at night. It does not hinder his ability to feed himself. No problem with taste, smell or appetite. Denies constipation, has some urinary urgency. Takes dose of Carb/Levo at 7; 12 and 4. Feels like medication helped at first but now it's not. No side effects from med. He goes to sleep at 10:30 pm. Denies dreaming or hallucinations.   UPDATE 07/15/12 (VRP): Since last visit, carb/levo has helped his tremor. Now able to fall asleep better because tremor is better controlled. No side effects from medications. Satisfied with the results so far.   Prior HPI (03/26/2012) (VRP): 84 year old right-handed male with history of atrial fibrillation, hypertension, hypothyroidism, hypercholesterolemia, coronary artery disease, here for evaluation of gait difficulty and tremor. Evaluate for possible Parkinson disease. In 2010, patient began to develop intermittent left upper extremity resting tremor. Tremor progressed to more continuous and bilateral tremor. He does not have significant difficulty when he is doing actions. In addition by 2011 he began to use a cane for walking imbalance. In 2012 he was using a walker. In 2013 he's been using a wheelchair most of the time. He is able to take a few steps with assistance to the bathroom. Of note patient's voice has become more quite over the past 2 years. No constipation, sleep disturbance, anxiety or smell or taste difficulty. His tremors worsened nighttime. No family history of tremor.   REVIEW OF  SYSTEMS: Full 14 system review of systems performed and notable only for: walking diff memory loss drooling hearing loss.    ALLERGIES: No Known Allergies  HOME MEDICATIONS: Outpatient Medications Prior to Visit  Medication Sig Dispense Refill  . carbidopa-levodopa (SINEMET IR) 25-100 MG tablet TAKE 2 TABLETS BY MOUTH THREE TIMES DAILY 540 tablet 1  .  CARTIA XT 120 MG 24 hr capsule TAKE 1 CAPSULE(120 MG) BY MOUTH DAILY 90 capsule 2  . Cholecalciferol (VITAMIN D3) 2000 UNITS TABS Take 2,000 Units by mouth daily.    Marland Kitchen ezetimibe (ZETIA) 10 MG tablet TAKE 1 TABLET BY MOUTH DAILY 90 tablet 3  . fexofenadine (ALLEGRA) 60 MG tablet Take 60 mg by mouth daily as needed for allergies.     . furosemide (LASIX) 80 MG tablet Take 80 mg by mouth 2 (two) times daily.     Marland Kitchen levothyroxine (SYNTHROID, LEVOTHROID) 75 MCG tablet Take 75 mcg by mouth daily before breakfast.     . losartan (COZAAR) 100 MG tablet Take 100 mg by mouth daily.  2  . Multiple Vitamin (MULTIVITAMIN) capsule Take 1 capsule by mouth daily.    . Multiple Vitamins-Minerals (PRESERVISION AREDS) CAPS Take 1 capsule by mouth daily.     . potassium chloride SA (K-DUR,KLOR-CON) 20 MEQ tablet Take 30 mEq by mouth daily.    . pravastatin (PRAVACHOL) 40 MG tablet TAKE 1 TABLET BY MOUTH EVERY DAY 90 tablet 3  . vitamin B-12 (CYANOCOBALAMIN) 1000 MCG tablet Take 1,000 mcg by mouth daily.    Marland Kitchen warfarin (COUMADIN) 1 MG tablet TAKE BY MOUTH AS DIRECTED BY COUMADIN CLINIC 180 tablet 1   No facility-administered medications prior to visit.     PAST MEDICAL HISTORY: Past Medical History:  Diagnosis Date  . Allergic rhinitis   . Benign hypertensive heart disease without heart failure   . Cancer ([place]) 09/2015   skin ,basal cell  . Chronic knee pain   . Constipation   . COPD, mild ([place])   . Coronary artery disease    s/p PCI of the RCA  . Diverticulosis   . Dyslipidemia   . Gout   . Hypertension   . Long term (current) use of anticoagulants   . Onychomycosis   . 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 Parkinson's disease ([place]) 03/2012   Dr Vilinda Blanks started  . Permanent atrial fibrillation (H[facility])    Chronic. ECHO 06/04/11 LVEF estimated by 2D at 52.1%  . Pure  hypercholesterolemia   . Seborrheic keratosis, inflamed   . Skin lesion    Plan to remove left posterior ear lesion with electrocautery  . Tachy-brady syndrome [facility])    s/p permanent pacemaker placement  . Unspecified hypothyroidism   . Vertigo   . Vitamin D deficiency     PAST SURGICAL HISTORY: Past Surgical History:  Procedure Laterality Date  . FRACTURE SURGERY Right    clavicle  . INGUINAL HERNIA REPAIR Bilateral   . LAPAROSCOPIC PARTIAL COLECTOMY     Partial resection of sigmoid colon  . PACEMAKER PLACEMENT  2003   Tachybradycardia syndrome. Gen change 07/29/08, Dr. Leonia Reeves  . TYMPANOPLASTY Bilateral     FAMILY HISTORY: Family History  Problem Relation Age of Onset  . CAD Father   . Diabetes Father   . Bladder Cancer Unknown   . Leukemia Unknown     SOCIAL HISTORY: Social History   Socioeconomic History  .  Marital status: Widowed    Spouse name: Not on file  . Number of children: 2  . Years of education: 7TH  . Highest education level: Not on file  Occupational History  . Not on file  Social Needs  . Financial resource strain: Not on file  . Food insecurity:    Worry: Not on file    Inability: Not on file  . Transportation needs:    Medical: Not on file    Non-medical: Not on file  Tobacco Use  . Smoking status: Former Smoker    Years: 10.00    Types: Cigarettes    Last attempt to quit: 06/18/1948    Years since quitting: 69.3  . Smokeless tobacco: Never Used  Substance and Sexual Activity  . Alcohol use: No  . Drug use: No  . Sexual activity: Not on file  Lifestyle  . Physical activity:    Days per week: Not on file    Minutes per session: Not on file  . Stress: Not on file  Relationships  . Social connections:    Talks on phone: Not on file    Gets together: Not on file    Attends religious service: Not on file    Active member of club or organization: Not on file    Attends meetings of clubs or organizations: Not on file    Relationship  status: Not on file  . Intimate partner violence:    Fear of current or ex partner: Not on file    Emotionally abused: Not on file    Physically abused: Not on file    Forced sexual activity: Not on file  Other Topics Concern  . Not on file  Social History Narrative   Patient lives at home with family.   Caffeine Use: rarely     PHYSICAL EXAM  Vitals:   09/24/17 0803  BP: 110/61  Pulse: 85  Weight: 158 lb (71.7 kg)   Body mass index is 29.85 kg/m.  Generalized: In no acute distress, pleasant male in wheelchair  Neck: Supple, no carotid bruits  Cardiac: Irregular rate rhythm, no murmur   Neurological examination  Mentation: Awake and alert, language fluent, comprehension intact.  Cranial nerve II-XII: Pupils were equal round reactive to light extraocular movements were full, visual field were full on confrontational test. facial sensation and strength were normal. hearing was intact to finger rubbing bilaterally. Uvula tongue midline. head turning and shoulder shrug and were normal and symmetric.Tongue protrusion into cheek strength was normal.   MOTOR: MINOR TONGUE TREMOR / DYSKINESIAS WITH FINGER TAPPING; NO COGWHEELING. MILD BRADYKINESIA IN BUE and BLE. Normal bulk. BUE 4-5. BLE 3-4. No pronator drift.  SENSORY: normal and symmetric to light touch. ABSENT VIB AT TOES AND ANKLES.  COORDINATION: BUE ACTION TREMOR.  REFLEXES: Deep tendon reflexes in the upper and lower extremities are TRACE and symmetric.  GAIT/STATION: IN WHEELCHAIR   DIAGNOSTIC DATA (LABS, IMAGING, TESTING)  No results found for: WBC, HGB, HCT, MCV, PLT  CMP Latest Ref Rng & Units 03/16/2016 01/26/2015 07/30/2014  Glucose 65 - 99 mg/dL 97 91 89  BUN 7 - 25 mg/dL 22 24(H) 24(H)  Creatinine 0.70 - 1.11 mg/dL 1.34(H) 1.09 1.13  Sodium 135 - 146 mmol/L 143 142 143  Potassium 3.5 - 5.3 mmol/L 4.7 4.7 3.9  Chloride 98 - 110 mmol/L 105 105 105  CO2 20 - 31 mmol/L 28 30 32  Calcium 8.6 - 10.3 mg/dL 9.3 9.7  8.9  Total Protein 6.1 - 8.1 g/dL 6.4 7.1 6.9  Total Bilirubin 0.2 - 1.2 mg/dL 0.6 0.8 0.8  Alkaline Phos 40 - 115 U/L 108 117 114  AST 10 - 35 U/L 16 15 18   ALT 9 - 46 U/L 13 9 15     07/21/12 CT head  - Mild chronic small vessel ischemic disease with focal chronic lacunar infarcts in the right basal ganglia and left frontal subcortical regions. Mild diffuse atrophy. No acute findings.     ASSESSMENT AND PLAN  84 y.o. right-handed male with history of atrial fibrillation, hypertension, hypothyroidism, hypercholesteremia, coronary artery disease, here for evaluation of gait difficulty and tremor. He has 4/4 cardinal signs of Parkinson's disease. Stable on carb/levo 2 tabs three times a day, but patient has advanced parkinson's disease, gradually progression, and now patient is almost completely wheelchair bound.    Dx:  Parkinson disease ([place])  Dysphagia, unspecified type  Gait difficulty    PLAN:   I spent 15 minutes of face to face time with patient. Greater than 50% of time was spent in counseling and coordination of care with patient. In summary we discussed:   PARKINSON'S DISEASE - continue carb/levo 2 tabs three times a day  - fall risk precautions reviewed - I offered to have as needed follow up, but patient prefers to continue annual neurology check up  Meds ordered this encounter  Medications  . carbidopa-levodopa (SINEMET IR) 25-100 MG tablet    Sig: Take 2 tablets by mouth 3 (three) times daily.    Dispense:  540 tablet    Refill:  4   Return in about 1 year (around 09/25/2018).     Penni Bombard, MD 11/17/9507, 3:26 AM Certified in Neurology, Neurophysiology and Neuroimaging  [facility] [address], [place], [place] [address] [phone]

## 2017-09-27 ENCOUNTER — Other Ambulatory Visit: Payer: Self-pay | Admitting: Cardiology

## 2017-10-16 ENCOUNTER — Ambulatory Visit: Payer: Medicare Other | Admitting: *Deleted

## 2017-10-16 DIAGNOSIS — I4891 Unspecified atrial fibrillation: Secondary | ICD-10-CM

## 2017-10-16 DIAGNOSIS — I482 Chronic atrial fibrillation, unspecified: Secondary | ICD-10-CM

## 2017-10-16 DIAGNOSIS — Z5181 Encounter for therapeutic drug level monitoring: Secondary | ICD-10-CM | POA: Diagnosis not present

## 2017-10-16 LAB — POCT INR: INR: 3.2

## 2017-10-16 NOTE — Patient Instructions (Signed)
Description   Do not take coumadin today May 1st then continue  taking  2 tablets daily except 1 tablet on Tuesdays  and Saturdays.  Recheck INR in 2 weeks. Call if scheduled for any procedures that needs to hold coumadin [phone] or has any new medications

## 2017-10-17 DIAGNOSIS — E039 Hypothyroidism, unspecified: Secondary | ICD-10-CM | POA: Diagnosis not present

## 2017-10-17 DIAGNOSIS — E785 Hyperlipidemia, unspecified: Secondary | ICD-10-CM | POA: Diagnosis not present

## 2017-10-17 DIAGNOSIS — I2510 Atherosclerotic heart disease of native coronary artery without angina pectoris: Secondary | ICD-10-CM | POA: Diagnosis not present

## 2017-10-17 DIAGNOSIS — J449 Chronic obstructive pulmonary disease, unspecified: Secondary | ICD-10-CM | POA: Diagnosis not present

## 2017-10-19 ENCOUNTER — Other Ambulatory Visit: Payer: Self-pay | Admitting: Cardiology

## 2017-10-29 DIAGNOSIS — G20 Parkinson's disease: Secondary | ICD-10-CM | POA: Diagnosis not present

## 2017-10-29 DIAGNOSIS — M1991 Primary osteoarthritis, unspecified site: Secondary | ICD-10-CM | POA: Diagnosis not present

## 2017-10-29 DIAGNOSIS — J449 Chronic obstructive pulmonary disease, unspecified: Secondary | ICD-10-CM | POA: Diagnosis not present

## 2017-10-29 DIAGNOSIS — I5032 Chronic diastolic (congestive) heart failure: Secondary | ICD-10-CM | POA: Diagnosis not present

## 2017-10-29 DIAGNOSIS — I2510 Atherosclerotic heart disease of native coronary artery without angina pectoris: Secondary | ICD-10-CM | POA: Diagnosis not present

## 2017-10-29 DIAGNOSIS — I110 Hypertensive heart disease with heart failure: Secondary | ICD-10-CM | POA: Diagnosis not present

## 2017-10-31 ENCOUNTER — Ambulatory Visit: Payer: Medicare Other | Admitting: *Deleted

## 2017-10-31 DIAGNOSIS — I482 Chronic atrial fibrillation, unspecified: Secondary | ICD-10-CM

## 2017-10-31 DIAGNOSIS — I4891 Unspecified atrial fibrillation: Secondary | ICD-10-CM | POA: Diagnosis not present

## 2017-10-31 DIAGNOSIS — Z5181 Encounter for therapeutic drug level monitoring: Secondary | ICD-10-CM | POA: Diagnosis not present

## 2017-10-31 LAB — POCT INR: INR: 2.6

## 2017-10-31 NOTE — Patient Instructions (Signed)
Description   Continue  taking  2 tablets daily except 1 tablet on Tuesdays  and Saturdays.  Recheck INR in 4 weeks. Call if scheduled for any procedures that needs to hold coumadin [phone] or has any new medications

## 2017-11-27 ENCOUNTER — Ambulatory Visit: Payer: Medicare Other | Admitting: *Deleted

## 2017-11-27 DIAGNOSIS — I482 Chronic atrial fibrillation, unspecified: Secondary | ICD-10-CM

## 2017-11-27 DIAGNOSIS — Z5181 Encounter for therapeutic drug level monitoring: Secondary | ICD-10-CM | POA: Diagnosis not present

## 2017-11-27 DIAGNOSIS — I4891 Unspecified atrial fibrillation: Secondary | ICD-10-CM | POA: Diagnosis not present

## 2017-11-27 LAB — POCT INR: INR: 2.4 (ref 2.0–3.0)

## 2017-11-27 NOTE — Patient Instructions (Signed)
Description   Continue  taking  2 tablets daily except 1 tablet on Tuesdays  and Saturdays.  Recheck INR in 4 weeks. Call if scheduled for any procedures that needs to hold coumadin [phone] or has any new medications

## 2017-11-28 DIAGNOSIS — G20 Parkinson's disease: Secondary | ICD-10-CM | POA: Diagnosis not present

## 2017-11-28 DIAGNOSIS — I2510 Atherosclerotic heart disease of native coronary artery without angina pectoris: Secondary | ICD-10-CM | POA: Diagnosis not present

## 2017-11-28 DIAGNOSIS — J449 Chronic obstructive pulmonary disease, unspecified: Secondary | ICD-10-CM | POA: Diagnosis not present

## 2017-11-28 DIAGNOSIS — I509 Heart failure, unspecified: Secondary | ICD-10-CM | POA: Diagnosis not present

## 2017-11-29 ENCOUNTER — Encounter: Payer: Self-pay | Admitting: Internal Medicine

## 2017-11-29 ENCOUNTER — Other Ambulatory Visit: Payer: Self-pay | Admitting: Cardiology

## 2017-11-29 DIAGNOSIS — I495 Sick sinus syndrome: Secondary | ICD-10-CM | POA: Diagnosis not present

## 2017-12-23 ENCOUNTER — Other Ambulatory Visit: Payer: Self-pay | Admitting: Cardiology

## 2017-12-25 ENCOUNTER — Ambulatory Visit: Payer: Medicare Other | Admitting: *Deleted

## 2017-12-25 DIAGNOSIS — I4891 Unspecified atrial fibrillation: Secondary | ICD-10-CM

## 2017-12-25 DIAGNOSIS — Z5181 Encounter for therapeutic drug level monitoring: Secondary | ICD-10-CM

## 2017-12-25 DIAGNOSIS — I482 Chronic atrial fibrillation, unspecified: Secondary | ICD-10-CM

## 2017-12-25 LAB — POCT INR: INR: 2.3 (ref 2.0–3.0)

## 2017-12-25 NOTE — Patient Instructions (Signed)
Description   Continue  taking  2 tablets daily except 1 tablet on Tuesdays  and Saturdays.  Recheck INR in 5 weeks. Call if scheduled for any procedures that needs to hold coumadin [phone] or has any new medications

## 2018-01-03 ENCOUNTER — Encounter: Payer: Self-pay | Admitting: Cardiology

## 2018-01-17 ENCOUNTER — Other Ambulatory Visit: Payer: Self-pay | Admitting: Cardiology

## 2018-01-17 DIAGNOSIS — J449 Chronic obstructive pulmonary disease, unspecified: Secondary | ICD-10-CM | POA: Diagnosis not present

## 2018-01-17 DIAGNOSIS — I2510 Atherosclerotic heart disease of native coronary artery without angina pectoris: Secondary | ICD-10-CM | POA: Diagnosis not present

## 2018-01-17 DIAGNOSIS — E039 Hypothyroidism, unspecified: Secondary | ICD-10-CM | POA: Diagnosis not present

## 2018-01-17 DIAGNOSIS — E782 Mixed hyperlipidemia: Secondary | ICD-10-CM | POA: Diagnosis not present

## 2018-01-29 ENCOUNTER — Ambulatory Visit: Payer: Medicare Other | Admitting: *Deleted

## 2018-01-29 DIAGNOSIS — Z5181 Encounter for therapeutic drug level monitoring: Secondary | ICD-10-CM | POA: Diagnosis not present

## 2018-01-29 DIAGNOSIS — I482 Chronic atrial fibrillation, unspecified: Secondary | ICD-10-CM

## 2018-01-29 DIAGNOSIS — I4891 Unspecified atrial fibrillation: Secondary | ICD-10-CM | POA: Diagnosis not present

## 2018-01-29 LAB — POCT INR: INR: 2.4 (ref 2.0–3.0)

## 2018-01-29 NOTE — Patient Instructions (Signed)
Description   Continue  taking  2 tablets daily except 1 tablet on Tuesdays  and Saturdays.  Recheck INR in 6 weeks. Call if scheduled for any procedures that needs to hold coumadin [phone] or has any new medications     

## 2018-02-25 DIAGNOSIS — G20 Parkinson's disease: Secondary | ICD-10-CM | POA: Diagnosis not present

## 2018-02-25 DIAGNOSIS — I2510 Atherosclerotic heart disease of native coronary artery without angina pectoris: Secondary | ICD-10-CM | POA: Diagnosis not present

## 2018-02-25 DIAGNOSIS — I509 Heart failure, unspecified: Secondary | ICD-10-CM | POA: Diagnosis not present

## 2018-02-25 DIAGNOSIS — J449 Chronic obstructive pulmonary disease, unspecified: Secondary | ICD-10-CM | POA: Diagnosis not present

## 2018-02-28 ENCOUNTER — Encounter: Payer: Self-pay | Admitting: Internal Medicine

## 2018-02-28 DIAGNOSIS — I495 Sick sinus syndrome: Secondary | ICD-10-CM | POA: Diagnosis not present

## 2018-03-12 ENCOUNTER — Ambulatory Visit: Payer: Medicare Other | Admitting: *Deleted

## 2018-03-12 ENCOUNTER — Encounter (INDEPENDENT_AMBULATORY_CARE_PROVIDER_SITE_OTHER): Payer: Self-pay

## 2018-03-12 DIAGNOSIS — I482 Chronic atrial fibrillation, unspecified: Secondary | ICD-10-CM

## 2018-03-12 DIAGNOSIS — I4891 Unspecified atrial fibrillation: Secondary | ICD-10-CM

## 2018-03-12 DIAGNOSIS — Z5181 Encounter for therapeutic drug level monitoring: Secondary | ICD-10-CM | POA: Diagnosis not present

## 2018-03-12 LAB — POCT INR: INR: 2 (ref 2.0–3.0)

## 2018-03-12 NOTE — Patient Instructions (Signed)
Description   Continue  taking  2 tablets daily except 1 tablet on Tuesdays  and Saturdays.  Recheck INR in 6 weeks. Call if scheduled for any procedures that needs to hold coumadin [phone] or has any new medications     

## 2018-03-13 ENCOUNTER — Encounter: Payer: Self-pay | Admitting: Internal Medicine

## 2018-03-17 DIAGNOSIS — G20 Parkinson's disease: Secondary | ICD-10-CM | POA: Diagnosis not present

## 2018-03-17 DIAGNOSIS — J449 Chronic obstructive pulmonary disease, unspecified: Secondary | ICD-10-CM | POA: Diagnosis not present

## 2018-03-17 DIAGNOSIS — I509 Heart failure, unspecified: Secondary | ICD-10-CM | POA: Diagnosis not present

## 2018-03-17 DIAGNOSIS — Z950 Presence of cardiac pacemaker: Secondary | ICD-10-CM | POA: Diagnosis not present

## 2018-03-17 DIAGNOSIS — Z951 Presence of aortocoronary bypass graft: Secondary | ICD-10-CM | POA: Diagnosis not present

## 2018-03-17 DIAGNOSIS — Z85828 Personal history of other malignant neoplasm of skin: Secondary | ICD-10-CM | POA: Diagnosis not present

## 2018-03-17 DIAGNOSIS — N200 Calculus of kidney: Secondary | ICD-10-CM | POA: Diagnosis not present

## 2018-03-17 DIAGNOSIS — I4891 Unspecified atrial fibrillation: Secondary | ICD-10-CM | POA: Diagnosis not present

## 2018-03-17 DIAGNOSIS — I2510 Atherosclerotic heart disease of native coronary artery without angina pectoris: Secondary | ICD-10-CM | POA: Diagnosis not present

## 2018-03-17 DIAGNOSIS — Z9181 History of falling: Secondary | ICD-10-CM | POA: Diagnosis not present

## 2018-03-17 DIAGNOSIS — M109 Gout, unspecified: Secondary | ICD-10-CM | POA: Diagnosis not present

## 2018-03-17 DIAGNOSIS — Z7901 Long term (current) use of anticoagulants: Secondary | ICD-10-CM | POA: Diagnosis not present

## 2018-03-17 DIAGNOSIS — I110 Hypertensive heart disease with heart failure: Secondary | ICD-10-CM | POA: Diagnosis not present

## 2018-03-21 ENCOUNTER — Other Ambulatory Visit: Payer: Self-pay | Admitting: Cardiology

## 2018-03-24 ENCOUNTER — Other Ambulatory Visit: Payer: Self-pay | Admitting: Cardiology

## 2018-03-31 ENCOUNTER — Ambulatory Visit: Payer: Medicare Other | Admitting: Internal Medicine

## 2018-03-31 ENCOUNTER — Encounter: Payer: Self-pay | Admitting: Internal Medicine

## 2018-03-31 VITALS — BP 98/54 | HR 70 | Ht 61.0 in | Wt 157.6 lb

## 2018-03-31 DIAGNOSIS — I10 Essential (primary) hypertension: Secondary | ICD-10-CM

## 2018-03-31 DIAGNOSIS — Z950 Presence of cardiac pacemaker: Secondary | ICD-10-CM | POA: Diagnosis not present

## 2018-03-31 DIAGNOSIS — I4821 Permanent atrial fibrillation: Secondary | ICD-10-CM

## 2018-03-31 DIAGNOSIS — I2510 Atherosclerotic heart disease of native coronary artery without angina pectoris: Secondary | ICD-10-CM

## 2018-03-31 DIAGNOSIS — I495 Sick sinus syndrome: Secondary | ICD-10-CM

## 2018-03-31 NOTE — Patient Instructions (Addendum)
Medication Instructions:  Your physician recommends that you continue on your current medications as directed. Please refer to the Current Medication list given to you today.  Labwork: None ordered.  Testing/Procedures: None ordered.  Follow-Up:  Your physician wants you to follow-up in: one year with Tommye Standard, PA.   reminder letter in the mail two months in advance. If you don't receive a letter, please call our office to schedule the follow-up appointment.  Any Other Special Instructions Will Be Listed Below (If Applicable).  If you need a refill on your cardiac medications before your next appointment, please call your pharmacy.

## 2018-03-31 NOTE — Progress Notes (Signed)
PCP: Josetta Huddle, MD Primary Cardiologist: Dr Radford Pax Primary EP:  Dr Rayann Heman  Jerry Wood is a 84 y.o. male who presents today for routine electrophysiology followup.  Since last being seen in our clinic, the patient reports doing very well.  Today, he denies symptoms of palpitations, chest pain, shortness of breath,  lower extremity edema, dizziness, presyncope, or syncope.  The patient is otherwise without complaint today.   Past Medical History:  Diagnosis Date  . Allergic rhinitis   . Benign hypertensive heart disease without heart failure   . Cancer ([place]) 09/2015   skin ,basal cell  . Chronic knee pain   . Constipation   . COPD, mild ([place])   . Coronary artery disease    s/p PCI of the RCA  . Diverticulosis   . Dyslipidemia   . Gout   . Hypertension   . Long term (current) use of anticoagulants   . Onychomycosis   . 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 Parkinson's disease ([place]) 03/2012   Dr Vilinda Blanks started  . Permanent atrial fibrillation    Chronic. ECHO 06/04/11 LVEF estimated by 2D at 52.1%  . Pure hypercholesterolemia   . Seborrheic keratosis, inflamed   . Skin lesion    Plan to remove left posterior ear lesion with electrocautery  . Tachy-brady syndrome [facility])    s/p permanent pacemaker placement  . Unspecified hypothyroidism   . Vertigo   . Vitamin D deficiency    Past Surgical History:  Procedure Laterality Date  . FRACTURE SURGERY Right    clavicle  . INGUINAL HERNIA REPAIR Bilateral   . LAPAROSCOPIC PARTIAL COLECTOMY     Partial resection of sigmoid colon  . PACEMAKER PLACEMENT  2003   Tachybradycardia syndrome. Gen change 07/29/08, Dr. Leonia Reeves  . TYMPANOPLASTY Bilateral     ROS- all systems are reviewed and negative except as per HPI above  Current Outpatient Medications  Medication Sig Dispense Refill  . carbidopa-levodopa  (SINEMET IR) 25-100 MG tablet Take 2 tablets by mouth 3 (three) times daily. 540 tablet 4  . Cholecalciferol (VITAMIN D3) 2000 UNITS TABS Take 2,000 Units by mouth daily.    Marland Kitchen diltiazem (CARDIZEM CD) 120 MG 24 hr capsule Take 1 capsule (120 mg total) by mouth daily. Please make overdue appt with Dr. Radford Pax before anymore refills. 2nd attempt 15 capsule 0  . ezetimibe (ZETIA) 10 MG tablet Take 1 tablet (10 mg total) by mouth daily. Please make overdue appt with Dr. Radford Pax before anymore refills. 1st attempt 30 tablet 0  . fexofenadine (ALLEGRA) 60 MG tablet Take 60 mg by mouth daily as needed for allergies.     . furosemide (LASIX) 80 MG tablet Take 80 mg by mouth 2 (two) times daily.     Marland Kitchen levothyroxine (SYNTHROID, LEVOTHROID) 75 MCG tablet Take 75 mcg by mouth daily before breakfast.     . losartan (COZAAR) 100 MG tablet Take 100 mg by mouth daily.  2  . Multiple Vitamin (MULTIVITAMIN) capsule Take 1 capsule by mouth daily.    . Multiple Vitamins-Minerals (PRESERVISION AREDS) CAPS Take 1 capsule by mouth daily.     . potassium chloride SA (K-DUR,KLOR-CON) 20 MEQ tablet Take 30 mEq by mouth daily.    . pravastatin (PRAVACHOL) 40 MG tablet TAKE 1 TABLET BY MOUTH EVERY DAY 90 tablet 1  . vitamin B-12 (CYANOCOBALAMIN) 1000 MCG tablet Take  1,000 mcg by mouth daily.    Marland Kitchen warfarin (COUMADIN) 1 MG tablet TAKE BY MOUTH AS DIRECTED BY COUMADIN CLINIC 180 tablet 0   No current facility-administered medications for this visit.     Physical Exam: Vitals:   03/31/18 1151  BP: (!) 98/54  Pulse: 70  SpO2: 96%  Weight: 157 lb 9.6 oz (71.5 kg)  Height: 5\' 1"  (1.549 m)    GEN- The patient is elderly and frail appearing, alert and oriented x 3 today.   Head- normocephalic, atraumatic Eyes-  Sclera clear, conjunctiva pink Ears- hearing intact Oropharynx- clear Lungs- Clear to ausculation bilaterally, normal work of breathing Chest- pacemaker pocket is well healed Heart- irregular rate and rhythm  GI-  soft, NT, ND, + BS Extremities- no clubbing, cyanosis, or edema  Pacemaker interrogation- reviewed in detail today,  See PACEART report  ekg tracing ordered today is personally reviewed and shows afib,   Assessment and Plan:  1. Symptomatic bradycardia  Normal pacemaker function See Pace Art report No changes today  2. Permanent afib Rate controlled chads2vasc score is at least 4.  Continue long term anticoagulation with coumadin  3. CAD No ischemic symptoms  4. HTN Stable No change required today  Return yearly to see EP PA  Thompson Grayer MD, [facility] 03/31/2018 12:43 PM

## 2018-04-04 LAB — CUP PACEART INCLINIC DEVICE CHECK
Battery Remaining Longevity: 30 mo
Implantable Lead Implant Date: 20100211
Implantable Lead Implant Date: 20100211
Implantable Lead Location: 753859
Implantable Lead Location: 753860
Implantable Lead Model: 4456
Implantable Lead Model: 4470
Implantable Lead Serial Number: 330954
Implantable Pulse Generator Implant Date: 20100211
Lead Channel Impedance Value: 340 Ohm
Lead Channel Pacing Threshold Amplitude: 1 V
Lead Channel Pacing Threshold Pulse Width: 0.4 ms
MDC IDC LEAD SERIAL: 313924
MDC IDC MSMT LEADCHNL RV SENSING INTR AMPL: 5.8 mV
MDC IDC PG SERIAL: 594044
MDC IDC SESS DTM: 20191018160814
MDC IDC SET LEADCHNL RA PACING AMPLITUDE: 3.5 V
MDC IDC SET LEADCHNL RV PACING AMPLITUDE: 2.4 V
MDC IDC SET LEADCHNL RV PACING PULSEWIDTH: 0.4 ms
MDC IDC SET LEADCHNL RV SENSING SENSITIVITY: 2 mV
MDC IDC STAT BRADY RV PERCENT PACED: 42 %

## 2018-04-07 ENCOUNTER — Other Ambulatory Visit: Payer: Self-pay | Admitting: Cardiology

## 2018-04-15 ENCOUNTER — Other Ambulatory Visit: Payer: Self-pay | Admitting: Cardiology

## 2018-04-23 ENCOUNTER — Ambulatory Visit: Payer: Medicare Other | Admitting: *Deleted

## 2018-04-23 DIAGNOSIS — I4891 Unspecified atrial fibrillation: Secondary | ICD-10-CM

## 2018-04-23 DIAGNOSIS — I482 Chronic atrial fibrillation, unspecified: Secondary | ICD-10-CM

## 2018-04-23 DIAGNOSIS — Z5181 Encounter for therapeutic drug level monitoring: Secondary | ICD-10-CM

## 2018-04-23 LAB — POCT INR: INR: 2.7 (ref 2.0–3.0)

## 2018-04-23 NOTE — Patient Instructions (Signed)
Description   Continue  taking  2 tablets daily except 1 tablet on Tuesdays  and Saturdays.  Recheck INR in 6 weeks. Call if scheduled for any procedures that needs to hold coumadin [phone] or has any new medications     

## 2018-04-29 ENCOUNTER — Other Ambulatory Visit: Payer: Self-pay | Admitting: Diagnostic Neuroimaging

## 2018-04-29 ENCOUNTER — Other Ambulatory Visit: Payer: Self-pay | Admitting: Cardiology

## 2018-04-30 NOTE — Telephone Encounter (Signed)
Outpatient Medication Detail    Disp Refills Start End   diltiazem (CARDIZEM CD) 120 MG 24 hr capsule 90 capsule 3 04/09/2018    Sig: TAKE 1 CAPSULE BY MOUTH DAILY   Sent to pharmacy as: diltiazem (CARDIZEM CD) 120 MG 24 hr capsule   E-Prescribing Status: Receipt confirmed by pharmacy (04/09/2018 2:49 PM EDT)   [place], [place]

## 2018-05-22 ENCOUNTER — Encounter: Payer: Self-pay | Admitting: Cardiology

## 2018-05-22 ENCOUNTER — Ambulatory Visit: Payer: Medicare Other | Admitting: Cardiology

## 2018-05-22 VITALS — BP 98/64 | HR 76 | Ht 61.0 in | Wt 157.4 lb

## 2018-05-22 DIAGNOSIS — I495 Sick sinus syndrome: Secondary | ICD-10-CM | POA: Diagnosis not present

## 2018-05-22 DIAGNOSIS — I10 Essential (primary) hypertension: Secondary | ICD-10-CM | POA: Diagnosis not present

## 2018-05-22 DIAGNOSIS — I2510 Atherosclerotic heart disease of native coronary artery without angina pectoris: Secondary | ICD-10-CM

## 2018-05-22 DIAGNOSIS — E7800 Pure hypercholesterolemia, unspecified: Secondary | ICD-10-CM

## 2018-05-22 DIAGNOSIS — I482 Chronic atrial fibrillation, unspecified: Secondary | ICD-10-CM | POA: Diagnosis not present

## 2018-05-22 NOTE — Patient Instructions (Signed)

## 2018-05-22 NOTE — Progress Notes (Signed)
Cardiology Office Note:    Date:  05/22/2018   ID:  Jerry Wood, DOB April 08, 1922, MRN [id number]  PCP:  Josetta Huddle, MD  Cardiologist:  No primary care provider on file.    Referring MD: Josetta Huddle, MD   Chief Complaint  Patient presents with  . Coronary Artery Disease  . Hypertension  . Hyperlipidemia    History of Present Illness:    Jerry Wood is a 84 y.o. male with a hx of chronic atrial fibrillation, ASCAD, HTN, dyslipidemia and tachybrady syndrome s/p PPM.  He is here today for followup and is doing well.  He denies any chest pain or pressure, SOB, DOE, PND, orthopnea, LE edema, dizziness, palpitations or syncope. He is compliant with his meds and is tolerating meds with no SE.    Past Medical History:  Diagnosis Date  . Allergic rhinitis   . Cancer ([place]) 09/2015   skin ,basal cell  . Chronic knee pain   . Constipation   . COPD, mild ([place])   . Coronary artery disease    s/p PCI of the RCA  . Diverticulosis   . Dyslipidemia   . Gout   . Hypertension   . Long term (current) use of anticoagulants   . Onychomycosis   . 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 Parkinson's disease ([place]) 03/2012   Dr Vilinda Blanks started  . Permanent atrial fibrillation    Chronic. ECHO 06/04/11 LVEF estimated by 2D at 52.1%  . Pure hypercholesterolemia   . Seborrheic keratosis, inflamed   . Skin lesion    Plan to remove left posterior ear lesion with electrocautery  . Tachy-brady syndrome [facility])    s/p permanent pacemaker placement  . Unspecified hypothyroidism   . Vertigo   . Vitamin D deficiency     Past Surgical History:  Procedure Laterality Date  . FRACTURE SURGERY Right    clavicle  . INGUINAL HERNIA REPAIR Bilateral   . LAPAROSCOPIC PARTIAL COLECTOMY     Partial resection of sigmoid colon  . PACEMAKER PLACEMENT  2003   Tachybradycardia syndrome.  Gen change 07/29/08, Dr. Leonia Reeves  . TYMPANOPLASTY Bilateral     Current Medications: Current Meds  Medication Sig  . carbidopa-levodopa (SINEMET IR) 25-100 MG tablet Take 2 tablets by mouth 3 (three) times daily.  . Cholecalciferol (VITAMIN D3) 2000 UNITS TABS Take 2,000 Units by mouth daily.  Marland Kitchen diltiazem (CARDIZEM CD) 120 MG 24 hr capsule TAKE 1 CAPSULE BY MOUTH DAILY  . Docusate Calcium (STOOL SOFTENER PO) Take 100 mg by mouth as directed.  . ezetimibe (ZETIA) 10 MG tablet Take 1 tablet (10 mg total) by mouth daily. Please make overdue appt with Dr. Radford Pax before anymore refills. 1st attempt  . fexofenadine (ALLEGRA) 60 MG tablet Take 60 mg by mouth daily as needed for allergies.   . furosemide (LASIX) 80 MG tablet Take 80 mg by mouth 2 (two) times daily.   Marland Kitchen levothyroxine (SYNTHROID, LEVOTHROID) 75 MCG tablet Take 75 mcg by mouth daily before breakfast.   . losartan (COZAAR) 100 MG tablet Take 100 mg by mouth daily.  . Multiple Vitamin (MULTIVITAMIN) capsule Take 1 capsule by mouth daily.  . Multiple Vitamins-Minerals (PRESERVISION AREDS) CAPS Take 1 capsule by mouth daily.   . potassium chloride SA (K-DUR,KLOR-CON) 20 MEQ tablet Take 30 mEq by mouth daily.  . pravastatin (PRAVACHOL) 40 MG tablet TAKE 1 TABLET  BY MOUTH EVERY DAY  . vitamin B-12 (CYANOCOBALAMIN) 1000 MCG tablet Take 1,000 mcg by mouth daily.  Marland Kitchen warfarin (COUMADIN) 1 MG tablet TAKE AS DIRECTED BY COUMADIN CLINIC     Allergies:   Patient has no known allergies.   Social History   Socioeconomic History  . Marital status: Widowed    Spouse name: Not on file  . Number of children: 2  . Years of education: 7TH  . Highest education level: Not on file  Occupational History  . Not on file  Social Needs  . Financial resource strain: Not on file  . Food insecurity:    Worry: Not on file    Inability: Not on file  . Transportation needs:    Medical: Not on file    Non-medical: Not on file  Tobacco Use  . Smoking  status: Former Smoker    Years: 10.00    Types: Cigarettes    Last attempt to quit: 06/18/1948    Years since quitting: 69.9  . Smokeless tobacco: Never Used  Substance and Sexual Activity  . Alcohol use: No  . Drug use: No  . Sexual activity: Not on file  Lifestyle  . Physical activity:    Days per week: Not on file    Minutes per session: Not on file  . Stress: Not on file  Relationships  . Social connections:    Talks on phone: Not on file    Gets together: Not on file    Attends religious service: Not on file    Active member of club or organization: Not on file    Attends meetings of clubs or organizations: Not on file    Relationship status: Not on file  Other Topics Concern  . Not on file  Social History Narrative   Patient lives at home with family.   Caffeine Use: rarely     Family History: The patient's family history includes Bladder Cancer in his unknown relative; CAD in his father; Diabetes in his father; Leukemia in his unknown relative.  ROS:   Please see the history of present illness.    ROS  All other systems reviewed and negative.   EKGs/Labs/Other Studies Reviewed:    The following studies were reviewed today: none  EKG:  EKG is not ordered today.   Recent Labs: No results found for requested labs within last 8760 hours.   Recent Lipid Panel    Component Value Date/Time   CHOL 148 04/24/2017 0803   TRIG 177 (H) 04/24/2017 0803   HDL 39 (L) 04/24/2017 0803   CHOLHDL 3.8 04/24/2017 0803   CHOLHDL 3.8 03/16/2016 0829   VLDL 27 03/16/2016 0829   LDLCALC 74 04/24/2017 0803    Physical Exam:    VS:  BP 98/64   Pulse 76   Ht 5\' 1"  (1.549 m)   Wt 157 lb 6.4 oz (71.4 kg)   SpO2 98%   BMI 29.74 kg/m     Wt Readings from Last 3 Encounters:  05/22/18 157 lb 6.4 oz (71.4 kg)  03/31/18 157 lb 9.6 oz (71.5 kg)  09/24/17 158 lb (71.7 kg)     GEN:  Well nourished, well developed in no acute distress HEENT: Normal NECK: No JVD; No carotid  bruits LYMPHATICS: No lymphadenopathy CARDIAC: irregularly irregular, no murmurs, rubs, gallops RESPIRATORY:  Clear to auscultation without rales, wheezing or rhonchi  ABDOMEN: Soft, non-tender, non-distended MUSCULOSKELETAL:  Trace LE edema; No deformity  SKIN: Warm and dry NEUROLOGIC:  Alert and oriented x 3 PSYCHIATRIC:  Normal affect   ASSESSMENT:    1. Atherosclerosis of native coronary artery of native heart without angina pectoris   2. Chronic atrial fibrillation   3. Essential hypertension   4. Tachycardia-bradycardia ([place])   5. Pure hypercholesterolemia    PLAN:    In order of problems listed above:  1.  ASCAD -status post remote PCI the RCA.  He denies any anginal symptoms.  He is not on aspirin due to warfarin.  He will continue on statin.  2.  Chronic atrial fibrillation -his heart rate is well controlled on exam today at 76 bpm.  He will continue on Cardizem CD 120 mg daily.  He is also on warfarin followed in Coumadin clinic.  3.  HTN -BP is well controlled on exam today.  He will continue on Cardizem CD 120 mg daily and losartan 100 mg daily.  His creatinine was stable at 1.06 and potassium 4 on 10/17/2017.  4.  Tachybrady syndrome -status post permanent pacemaker placement followed in device clinic.  5.  Hyperlipidemia -his LDL goal is less than 70.  His LDL was 53 on 08/23/2017 and normal ALT at 13.  He will continue on Zetia 10 mg daily and pravastatin 40 mg daily.   Medication Adjustments/Labs and Tests Ordered: Current medicines are reviewed at length with the patient today.  Concerns regarding medicines are outlined above.  No orders of the defined types were placed in this encounter.  No orders of the defined types were placed in this encounter.   Signed, Fransico Him, MD  05/22/2018 8:31 AM    [place]

## 2018-05-26 ENCOUNTER — Other Ambulatory Visit: Payer: Self-pay | Admitting: Cardiology

## 2018-05-28 DIAGNOSIS — J449 Chronic obstructive pulmonary disease, unspecified: Secondary | ICD-10-CM | POA: Diagnosis not present

## 2018-05-28 DIAGNOSIS — I2510 Atherosclerotic heart disease of native coronary artery without angina pectoris: Secondary | ICD-10-CM | POA: Diagnosis not present

## 2018-05-28 DIAGNOSIS — E785 Hyperlipidemia, unspecified: Secondary | ICD-10-CM | POA: Diagnosis not present

## 2018-05-28 DIAGNOSIS — E782 Mixed hyperlipidemia: Secondary | ICD-10-CM | POA: Diagnosis not present

## 2018-05-30 ENCOUNTER — Encounter: Payer: Self-pay | Admitting: Internal Medicine

## 2018-06-03 ENCOUNTER — Ambulatory Visit: Payer: Medicare Other | Admitting: *Deleted

## 2018-06-03 DIAGNOSIS — I4891 Unspecified atrial fibrillation: Secondary | ICD-10-CM

## 2018-06-03 DIAGNOSIS — I482 Chronic atrial fibrillation, unspecified: Secondary | ICD-10-CM | POA: Diagnosis not present

## 2018-06-03 DIAGNOSIS — Z5181 Encounter for therapeutic drug level monitoring: Secondary | ICD-10-CM

## 2018-06-03 LAB — POCT INR: INR: 2.2 (ref 2.0–3.0)

## 2018-06-03 NOTE — Patient Instructions (Signed)
Description   Continue  taking  2 tablets daily except 1 tablet on Tuesdays  and Saturdays.  Recheck INR in 6 weeks. Call if scheduled for any procedures that needs to hold coumadin [phone] or has any new medications

## 2018-06-21 ENCOUNTER — Other Ambulatory Visit: Payer: Self-pay | Admitting: Cardiology

## 2018-07-15 ENCOUNTER — Ambulatory Visit: Payer: Medicare Other

## 2018-07-15 DIAGNOSIS — I4891 Unspecified atrial fibrillation: Secondary | ICD-10-CM

## 2018-07-15 DIAGNOSIS — Z5181 Encounter for therapeutic drug level monitoring: Secondary | ICD-10-CM | POA: Diagnosis not present

## 2018-07-15 DIAGNOSIS — I482 Chronic atrial fibrillation, unspecified: Secondary | ICD-10-CM

## 2018-07-15 LAB — POCT INR: INR: 2.8 (ref 2.0–3.0)

## 2018-07-15 NOTE — Patient Instructions (Signed)
Continue taking  2 tablets daily except 1 tablet on Tuesdays  and Saturdays.  Recheck INR in 6 weeks. Call if scheduled for any procedures that needs to hold coumadin [phone] or has any new medications

## 2018-07-17 ENCOUNTER — Other Ambulatory Visit: Payer: Self-pay | Admitting: Cardiology

## 2018-08-25 ENCOUNTER — Ambulatory Visit: Payer: Medicare Other | Admitting: Pharmacist

## 2018-08-25 DIAGNOSIS — Z5181 Encounter for therapeutic drug level monitoring: Secondary | ICD-10-CM | POA: Diagnosis not present

## 2018-08-25 DIAGNOSIS — I4891 Unspecified atrial fibrillation: Secondary | ICD-10-CM

## 2018-08-25 DIAGNOSIS — I482 Chronic atrial fibrillation, unspecified: Secondary | ICD-10-CM

## 2018-08-25 LAB — POCT INR: INR: 3.3 — AB (ref 2.0–3.0)

## 2018-08-25 NOTE — Patient Instructions (Signed)
Description   Take only 1 tablet tonight then continue taking  2 tablets daily except 1 tablet on Tuesdays  and Saturdays.  Recheck INR in 4 weeks. Call if scheduled for any procedures that needs to hold coumadin [phone] or has any new medications

## 2018-08-29 ENCOUNTER — Encounter: Payer: Self-pay | Admitting: Internal Medicine

## 2018-08-29 DIAGNOSIS — I495 Sick sinus syndrome: Secondary | ICD-10-CM | POA: Diagnosis not present

## 2018-09-01 DIAGNOSIS — I509 Heart failure, unspecified: Secondary | ICD-10-CM | POA: Diagnosis not present

## 2018-09-01 DIAGNOSIS — Z0000 Encounter for general adult medical examination without abnormal findings: Secondary | ICD-10-CM | POA: Diagnosis not present

## 2018-09-01 DIAGNOSIS — Z1389 Encounter for screening for other disorder: Secondary | ICD-10-CM | POA: Diagnosis not present

## 2018-09-01 DIAGNOSIS — G20 Parkinson's disease: Secondary | ICD-10-CM | POA: Diagnosis not present

## 2018-09-01 DIAGNOSIS — I2510 Atherosclerotic heart disease of native coronary artery without angina pectoris: Secondary | ICD-10-CM | POA: Diagnosis not present

## 2018-09-01 DIAGNOSIS — J449 Chronic obstructive pulmonary disease, unspecified: Secondary | ICD-10-CM | POA: Diagnosis not present

## 2018-09-08 DIAGNOSIS — E785 Hyperlipidemia, unspecified: Secondary | ICD-10-CM | POA: Diagnosis not present

## 2018-09-08 DIAGNOSIS — E7800 Pure hypercholesterolemia, unspecified: Secondary | ICD-10-CM | POA: Diagnosis not present

## 2018-09-08 DIAGNOSIS — J449 Chronic obstructive pulmonary disease, unspecified: Secondary | ICD-10-CM | POA: Diagnosis not present

## 2018-09-08 DIAGNOSIS — E782 Mixed hyperlipidemia: Secondary | ICD-10-CM | POA: Diagnosis not present

## 2018-09-23 NOTE — Telephone Encounter (Signed)
This encounter was created in error - please disregard.

## 2018-09-24 ENCOUNTER — Other Ambulatory Visit: Payer: Self-pay

## 2018-09-24 ENCOUNTER — Ambulatory Visit (INDEPENDENT_AMBULATORY_CARE_PROVIDER_SITE_OTHER): Payer: Medicare Other | Admitting: Pharmacist

## 2018-09-24 ENCOUNTER — Telehealth: Payer: Self-pay | Admitting: *Deleted

## 2018-09-24 DIAGNOSIS — Z5181 Encounter for therapeutic drug level monitoring: Secondary | ICD-10-CM | POA: Diagnosis not present

## 2018-09-24 DIAGNOSIS — I4891 Unspecified atrial fibrillation: Secondary | ICD-10-CM | POA: Diagnosis not present

## 2018-09-24 DIAGNOSIS — I482 Chronic atrial fibrillation, unspecified: Secondary | ICD-10-CM | POA: Diagnosis not present

## 2018-09-24 LAB — POCT INR: INR: 2.5 (ref 2.0–3.0)

## 2018-09-24 MED ORDER — APIXABAN 5 MG PO TABS: 5.0000 mg | ORAL_TABLET | Freq: Two times a day (BID) | ORAL | 5 refills | Status: DC

## 2018-09-24 MED ORDER — APIXABAN 5 MG PO TABS: 5.0000 mg | ORAL_TABLET | Freq: Two times a day (BID) | ORAL | 0 refills | Status: DC

## 2018-09-24 NOTE — Telephone Encounter (Signed)
LVM for daughter, [name] on [place] requesting call back : due to current COVID 19 pandemic, our office is severely reducing in person visits in order to minimize the risk to our patients and healthcare providers. We recommend to convert patient's appointment to a video visit.

## 2018-09-25 NOTE — Telephone Encounter (Signed)
Unable to reach [facility]. LVM advising her I am cancelling patient 's apt next Tues. I advised she call back and reschedule to video or telephone, and we have availability next week. Left office number.

## 2018-09-26 NOTE — Telephone Encounter (Signed)
Pt's wife called to r/s appt to the original time and date and has consented to a Tele Visit due to not having access to the epuipment needed for a Virutal Visit. Pt consented for the insurance to be billed as such.

## 2018-09-29 ENCOUNTER — Encounter: Payer: Self-pay | Admitting: *Deleted

## 2018-09-29 NOTE — Telephone Encounter (Signed)
Called daughter [name], updated EMR.

## 2018-09-30 ENCOUNTER — Ambulatory Visit: Payer: Medicare Other | Admitting: Diagnostic Neuroimaging

## 2018-09-30 ENCOUNTER — Ambulatory Visit (INDEPENDENT_AMBULATORY_CARE_PROVIDER_SITE_OTHER): Payer: Medicare Other | Admitting: Diagnostic Neuroimaging

## 2018-09-30 ENCOUNTER — Other Ambulatory Visit: Payer: Self-pay

## 2018-09-30 ENCOUNTER — Encounter: Payer: Self-pay | Admitting: Diagnostic Neuroimaging

## 2018-09-30 DIAGNOSIS — G20 Parkinson's disease: Secondary | ICD-10-CM

## 2018-09-30 MED ORDER — CARBIDOPA-LEVODOPA 25-100 MG PO TABS: 2.0000 | ORAL_TABLET | Freq: Three times a day (TID) | ORAL | 4 refills | Status: DC

## 2018-09-30 NOTE — Progress Notes (Signed)
    Virtual Visit via Telephone Note  I connected with Jerry Wood on 09/30/18 at 10:00 AM EDT by telephone and verified that I am speaking with the correct person using two identifiers.   I discussed the limitations, risks, security and privacy concerns of performing an evaluation and management service by telephone and the availability of in person appointments. I also discussed with the patient that there may be a patient responsible charge related to this service. The patient expressed understanding and agreed to proceed.   History of Present Illness:  - patient is doing well - tolerating meds (carb/levo) - no falls - daughter agrees    Observations/Objective:  - awake; stable   Assessment and Plan:  PARKINSON'S DISEASE  - continue carb/levo 1 tab three times a day  - caution with balance and fall risk  Meds ordered this encounter  Medications  . carbidopa-levodopa (SINEMET IR) 25-100 MG tablet    Sig: Take 2 tablets by mouth 3 (three) times daily.    Dispense:  540 tablet    Refill:  4     Follow Up Instructions:  - Return in about 1 year (around 09/30/2019).    I discussed the assessment and treatment plan with the patient. The patient was provided an opportunity to ask questions and all were answered. The patient agreed with the plan and demonstrated an understanding of the instructions.   The patient was advised to call back or seek an in-person evaluation if the symptoms worsen or if the condition fails to improve as anticipated.  I provided 10 minutes of non-face-to-face time during this encounter.   Penni Bombard, MD 07/08/6242, 69:50 AM Certified in Neurology, Neurophysiology and Neuroimaging  [facility] [address], [place], [place] [address] [phone]

## 2018-10-22 ENCOUNTER — Other Ambulatory Visit: Payer: Self-pay | Admitting: Cardiology

## 2018-10-22 NOTE — Telephone Encounter (Signed)
Pt last saw Turner 05/22/18, last labs 09/01/18 at [facility] Creat 1.14, age 85, weight 71.4kg, based on specified criteria pt is on appropriate dosage of Eliquis 5mg  BID will refill rx.

## 2018-11-06 ENCOUNTER — Other Ambulatory Visit: Payer: Self-pay | Admitting: Internal Medicine

## 2018-11-28 DIAGNOSIS — I495 Sick sinus syndrome: Secondary | ICD-10-CM | POA: Diagnosis not present

## 2018-12-29 ENCOUNTER — Other Ambulatory Visit: Payer: Self-pay | Admitting: Internal Medicine

## 2018-12-29 DIAGNOSIS — I2510 Atherosclerotic heart disease of native coronary artery without angina pectoris: Secondary | ICD-10-CM | POA: Diagnosis not present

## 2018-12-29 DIAGNOSIS — E785 Hyperlipidemia, unspecified: Secondary | ICD-10-CM | POA: Diagnosis not present

## 2018-12-29 DIAGNOSIS — J449 Chronic obstructive pulmonary disease, unspecified: Secondary | ICD-10-CM | POA: Diagnosis not present

## 2018-12-29 DIAGNOSIS — E782 Mixed hyperlipidemia: Secondary | ICD-10-CM | POA: Diagnosis not present

## 2019-02-09 DIAGNOSIS — E782 Mixed hyperlipidemia: Secondary | ICD-10-CM | POA: Diagnosis not present

## 2019-02-09 DIAGNOSIS — E785 Hyperlipidemia, unspecified: Secondary | ICD-10-CM | POA: Diagnosis not present

## 2019-02-09 DIAGNOSIS — J449 Chronic obstructive pulmonary disease, unspecified: Secondary | ICD-10-CM | POA: Diagnosis not present

## 2019-02-09 DIAGNOSIS — I2510 Atherosclerotic heart disease of native coronary artery without angina pectoris: Secondary | ICD-10-CM | POA: Diagnosis not present

## 2019-02-27 DIAGNOSIS — I495 Sick sinus syndrome: Secondary | ICD-10-CM | POA: Diagnosis not present

## 2019-03-05 DIAGNOSIS — E039 Hypothyroidism, unspecified: Secondary | ICD-10-CM | POA: Diagnosis not present

## 2019-03-05 DIAGNOSIS — E785 Hyperlipidemia, unspecified: Secondary | ICD-10-CM | POA: Diagnosis not present

## 2019-03-05 DIAGNOSIS — J449 Chronic obstructive pulmonary disease, unspecified: Secondary | ICD-10-CM | POA: Diagnosis not present

## 2019-03-05 DIAGNOSIS — I2510 Atherosclerotic heart disease of native coronary artery without angina pectoris: Secondary | ICD-10-CM | POA: Diagnosis not present

## 2019-03-17 ENCOUNTER — Other Ambulatory Visit: Payer: Self-pay | Admitting: Internal Medicine

## 2019-04-01 DIAGNOSIS — Z23 Encounter for immunization: Secondary | ICD-10-CM | POA: Diagnosis not present

## 2019-04-22 ENCOUNTER — Other Ambulatory Visit: Payer: Self-pay | Admitting: Cardiology

## 2019-04-22 MED ORDER — DILTIAZEM HCL ER COATED BEADS 120 MG PO CP24: 120.0000 mg | ORAL_CAPSULE | Freq: Every day | ORAL | 0 refills | Status: DC

## 2019-04-27 ENCOUNTER — Other Ambulatory Visit: Payer: Self-pay | Admitting: Pharmacist

## 2019-04-27 MED ORDER — APIXABAN 5 MG PO TABS: 5.0000 mg | ORAL_TABLET | Freq: Two times a day (BID) | ORAL | 6 refills | Status: DC

## 2019-04-27 NOTE — Progress Notes (Signed)
Age 85, weight 71.4kg, SCr 1.14 on 09/01/18, last OV December 2019, afib indication

## 2019-05-29 ENCOUNTER — Encounter: Payer: Self-pay | Admitting: Internal Medicine

## 2019-06-18 DIAGNOSIS — H5211 Myopia, right eye: Secondary | ICD-10-CM | POA: Diagnosis not present

## 2019-06-18 DIAGNOSIS — I10 Essential (primary) hypertension: Secondary | ICD-10-CM | POA: Diagnosis not present

## 2019-06-18 DIAGNOSIS — H353132 Nonexudative age-related macular degeneration, bilateral, intermediate dry stage: Secondary | ICD-10-CM | POA: Diagnosis not present

## 2019-06-18 DIAGNOSIS — Z961 Presence of intraocular lens: Secondary | ICD-10-CM | POA: Diagnosis not present

## 2019-06-18 DIAGNOSIS — H5213 Myopia, bilateral: Secondary | ICD-10-CM | POA: Diagnosis not present

## 2019-06-20 ENCOUNTER — Other Ambulatory Visit: Payer: Self-pay | Admitting: Cardiology

## 2019-06-23 ENCOUNTER — Encounter: Payer: Medicare Other | Admitting: Physician Assistant

## 2019-07-10 ENCOUNTER — Telehealth: Payer: Self-pay | Admitting: Cardiology

## 2019-07-10 NOTE — Telephone Encounter (Signed)
Patient's daughter, Horris Latino, is requesting to accompany patient during appointment on 07/15/19 at 8:45 AM. She states patient will need assistance due to being in a wheelchair and Parkinson's disease. Please call.

## 2019-07-13 NOTE — Progress Notes (Signed)
Cardiology Office Note Date:  07/15/2019  Patient ID:  Jerry Wood, Jerry Wood 09-01-1921, MRN [id number] PCP:  Josetta Huddle, MD  Cardiologist:  Dr. Radford Pax Electrophysiologist: Dr. Rayann Heman   Chief Complaint:  Over due annual visit  History of Present Illness: Jerry Wood is a 86 y.o. male with history of tachy-brady syndrome w/PPM, HTN, COPD, Parkinson's, permanent Afib, CAD w/PCI to RCA "years ago".   Known to be essentially non-ambulatory, able to aid in transfers.  He comes in today to be seen for Dr. Rayann Heman, last seen by him Oct 2019.  At that time doing OK, no changes were made to his device programming or medicines.  Planned for annual visits.  He comes today accompanied by his daughter Horris Latino.  He is wheelchair bound, lives with another daughter.  He is able to help with transfers but is non-ambulatory.  Has had severe b/l arthritic knees and with advancing age and his Parkinson's despite PT a couple years ago, unable to ambulate.  The patient is hard of hearing though participates in the visit He denies any cancerns or symptoms.  Denies any CP, palpitations or cardiac awareness.  No dizzy spells, no near syncope or syncope.  He is on eliquis now, no reports of bleeding or signs of bleeding  His PMD does labs twice a year.  Device History: BSX dual chamber PPM implanted 2003 for tachy-brady syndrome; gen change 2010 Programmed VVIR  Past Medical History:  Diagnosis Date  . Allergic rhinitis   . Cancer ([place]) 09/2015   skin ,basal cell  . Chronic knee pain   . Constipation   . COPD, mild ([place])   . Coronary artery disease    s/p PCI of the RCA  . Diverticulosis   . Dyslipidemia   . Gout   . Hypertension   . Long term (current) use of anticoagulants   . Onychomycosis   . 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 Parkinson's disease ([place]) 03/2012   Dr  Vilinda Blanks started  . Permanent atrial fibrillation    Chronic. ECHO 06/04/11 LVEF estimated by 2D at 52.1%  . Pure hypercholesterolemia   . Seborrheic keratosis, inflamed   . Skin lesion    Plan to remove left posterior ear lesion with electrocautery  . Tachy-brady syndrome [facility])    s/p permanent pacemaker placement  . Unspecified hypothyroidism   . Vertigo   . Vitamin D deficiency     Past Surgical History:  Procedure Laterality Date  . FRACTURE SURGERY Right    clavicle  . INGUINAL HERNIA REPAIR Bilateral   . LAPAROSCOPIC PARTIAL COLECTOMY     Partial resection of sigmoid colon  . PACEMAKER PLACEMENT  2003   Tachybradycardia syndrome. Gen change 07/29/08, Dr. Leonia Reeves  . TYMPANOPLASTY Bilateral     Current Outpatient Medications  Medication Sig Dispense Refill  . apixaban (ELIQUIS) 5 MG TABS tablet Take 1 tablet (5 mg total) by mouth 2 (two) times daily. 60 tablet 6  . carbidopa-levodopa (SINEMET IR) 25-100 MG tablet Take 2 tablets by mouth 3 (three) times daily. 540 tablet 4  . Cholecalciferol (VITAMIN D3) 2000 UNITS TABS Take 2,000 Units by mouth daily.    Marland Kitchen diltiazem (CARDIZEM CD) 120 MG 24 hr capsule Take 1 capsule (120 mg total) by mouth daily. Please make yearly appt with Dr. Radford Pax for December before anymore refills. 1st attempt 90 capsule 0  .  Docusate Calcium (STOOL SOFTENER PO) Take 100 mg by mouth as directed.    . ezetimibe (ZETIA) 10 MG tablet TAKE 1 TABLET BY MOUTH DAILY 90 tablet 3  . fexofenadine (ALLEGRA) 60 MG tablet Take 60 mg by mouth daily as needed for allergies.     . furosemide (LASIX) 80 MG tablet Take 80 mg by mouth 2 (two) times daily.     Marland Kitchen levothyroxine (SYNTHROID, LEVOTHROID) 75 MCG tablet Take 75 mcg by mouth daily before breakfast.     . losartan (COZAAR) 100 MG tablet Take 100 mg by mouth daily.  2  . Multiple Vitamin (MULTIVITAMIN) capsule Take 1 capsule by mouth daily.    . Multiple Vitamins-Minerals (PRESERVISION AREDS) CAPS Take 1  capsule by mouth daily.     . potassium chloride SA (K-DUR,KLOR-CON) 20 MEQ tablet Take 30 mEq by mouth daily.    . pravastatin (PRAVACHOL) 40 MG tablet TAKE 1 TABLET BY MOUTH EVERY DAY 90 tablet 3  . vitamin B-12 (CYANOCOBALAMIN) 1000 MCG tablet Take 1,000 mcg by mouth daily.     No current facility-administered medications for this visit.    Allergies:   Patient has no known allergies.   Social History:  The patient  reports that he quit smoking about 71 years ago. His smoking use included cigarettes. He quit after 10.00 years of use. He has never used smokeless tobacco. He reports that he does not drink alcohol or use drugs.   Family History:  The patient's family history includes Bladder Cancer in an other family member; CAD in his father; Diabetes in his father; Leukemia in an other family member.  ROS:  Please see the history of present illness.   All other systems are reviewed and otherwise negative.   PHYSICAL EXAM:  VS:  Pulse 80   Ht 5\' 1"  (1.549 m)   Wt 154 lb (69.9 kg)   SpO2 98%   BMI 29.10 kg/m  BMI: Body mass index is 29.1 kg/m.  BP checked by myself manually is 102/60, 49/60 Well nourished, well developed, elderly in no acute distress  HEENT: normocephalic, atraumatic  Neck: no JVD, carotid bruits or masses Cardiac:  IRRR; no significant murmurs, no rubs, or gallops Lungs:  CTA b/l, no wheezing, rhonchi or rales  Abd: soft, nontender, obese MS: no deformity, age appropriate atrophy Ext:  Trace-1+ LE edema, L slightly > R (chronically)   Skin: warm and dry, no rash Neuro:  No gross deficits appreciated Psych: euthymic mood, full affect  PPM site is stable, no tethering or discomfort   EKG:  Done today and reviewed by myself shows: AFib 80bpm, a couple paced beats  PPM interrogation done today and reviewed by myself:  Battery estimate is 2 years Lead measurements are good VT episodes noted by device, EGMs available are reviewed, these are AFib with RVR One  EGM has noizse on the lead (in Aug 2020), no others Unable to reproduce this today with pocket manipulations, isometrics or arm movements He VP 46% HR histogram looks OK, predominantly 60-90's   02/01/15: TTE Study Conclusions - Left ventricle: The cavity size was normal. Wall thickness was   increased in a pattern of mild LVH. Systolic function was normal.   The estimated ejection fraction was in the range of 55% to 60%.   Wall motion was normal; there were no regional wall motion   abnormalities. - Mitral valve: There was mild regurgitation. - Left atrium: The atrium was severely dilated. - Right atrium:  The atrium was mildly dilated. - Pulmonary arteries: Systolic pressure was mildly increased. Impressions: - Normal LV systolic function; severe LAE; mild RAE; mild MR and   TR; mildly elevated pulmonary pressure.  Recent Labs: No results found for requested labs within last 8760 hours.  No results found for requested labs within last 8760 hours.   CrCl cannot be calculated (Patient's most recent lab result is older than the maximum 21 days allowed.).   Wt Readings from Last 3 Encounters:  07/15/19 154 lb (69.9 kg)  05/22/18 157 lb 6.4 oz (71.4 kg)  03/31/18 157 lb 9.6 oz (71.5 kg)     Other studies reviewed: Additional studies/records reviewed today include: summarized above  ASSESSMENT AND PLAN:  1. Tachy-brady syndrome w/PPM      Single episode of noise on RV lead back in Aug, unable to reproduce, lead measurements are stable      Not pacer dependent      Device check in clinic in 6 mo, sooner if needed       2. Permanent Afib     CHA2DS2Vasc is at least 4, on Eliquis     Labs requested from PMD office  3. HTN     Somewhat hypotensive today     Not symptomatic, does not ambulate but does stand to transfer     Reduce his losartan to 50mg  daily  4. CAD     No symptoms     On statin, no ASA given Eliquis     Follows with Dr. Radford Pax, sees her next  month    Disposition: As above, device clinic in 6 mo, and MD/APP EP visit in 1 year, sooner if needed    Current medicines are reviewed at length with the patient today.  The patient did not have any concerns regarding medicines.  Haywood Lasso, PA-C 07/15/2019 9:36 AM     [place]  [id number] [phone] (office)  [phone] (fax)

## 2019-07-15 ENCOUNTER — Ambulatory Visit: Payer: Medicare Other | Admitting: Physician Assistant

## 2019-07-15 ENCOUNTER — Other Ambulatory Visit: Payer: Self-pay

## 2019-07-15 VITALS — HR 80 | Ht 61.0 in | Wt 154.0 lb

## 2019-07-15 DIAGNOSIS — I4821 Permanent atrial fibrillation: Secondary | ICD-10-CM

## 2019-07-15 DIAGNOSIS — I482 Chronic atrial fibrillation, unspecified: Secondary | ICD-10-CM

## 2019-07-15 DIAGNOSIS — Z950 Presence of cardiac pacemaker: Secondary | ICD-10-CM | POA: Diagnosis not present

## 2019-07-15 DIAGNOSIS — I495 Sick sinus syndrome: Secondary | ICD-10-CM

## 2019-07-15 DIAGNOSIS — I10 Essential (primary) hypertension: Secondary | ICD-10-CM | POA: Diagnosis not present

## 2019-07-15 MED ORDER — LOSARTAN POTASSIUM 50 MG PO TABS: 50.0000 mg | ORAL_TABLET | Freq: Every day | ORAL | 2 refills | Status: DC

## 2019-07-15 NOTE — Patient Instructions (Addendum)
Medication Instructions:    START TAKING LOSARTAN 50 MG ONCE  A DAY    *If you need a refill on your cardiac medications before your next appointment, please call your pharmacy*  Lab Work: NONE ORDERED  TODAY   If you have labs (blood work) drawn today and your tests are completely normal, you will receive your results only by: Marland Kitchen MyChart Message (if you have MyChart) OR . A paper copy in the mail If you have any lab test that is abnormal or we need to change your treatment, we will call you to review the results.  Testing/Procedures: NONE ORDERED  TODAY  Follow-Up: At [facility], you and your health needs are our priority.  As part of our continuing mission to provide you with exceptional heart care, we have created designated Provider Care Teams.  These Care Teams include your primary Cardiologist (physician) and Advanced Practice Providers (APPs -  Physician Assistants and Nurse Practitioners) who all work together to provide you with the care you need, when you need it.  Your next appointment:   6 month(s)    1 Year with Dr Rayann Heman of App    The format for your next appointment:   In Person  Provider:   Device Clinic  Other Instructions:

## 2019-07-24 ENCOUNTER — Other Ambulatory Visit: Payer: Self-pay | Admitting: Cardiology

## 2019-08-06 ENCOUNTER — Ambulatory Visit: Payer: Medicare Other | Admitting: Cardiology

## 2019-08-27 ENCOUNTER — Other Ambulatory Visit: Payer: Self-pay

## 2019-08-27 ENCOUNTER — Encounter: Payer: Self-pay | Admitting: Cardiology

## 2019-08-27 ENCOUNTER — Ambulatory Visit: Payer: Medicare Other | Admitting: Cardiology

## 2019-08-27 VITALS — BP 124/68 | HR 72 | Ht 61.0 in | Wt 152.0 lb

## 2019-08-27 DIAGNOSIS — I5032 Chronic diastolic (congestive) heart failure: Secondary | ICD-10-CM

## 2019-08-27 DIAGNOSIS — E7800 Pure hypercholesterolemia, unspecified: Secondary | ICD-10-CM

## 2019-08-27 DIAGNOSIS — I10 Essential (primary) hypertension: Secondary | ICD-10-CM | POA: Diagnosis not present

## 2019-08-27 DIAGNOSIS — I2510 Atherosclerotic heart disease of native coronary artery without angina pectoris: Secondary | ICD-10-CM

## 2019-08-27 DIAGNOSIS — I495 Sick sinus syndrome: Secondary | ICD-10-CM

## 2019-08-27 DIAGNOSIS — I4821 Permanent atrial fibrillation: Secondary | ICD-10-CM

## 2019-08-27 NOTE — Progress Notes (Signed)
Cardiology Office Note:    Date:  08/27/2019   ID:  Jerry Wood, DOB Sep 30, 1921, MRN [id number]  PCP:  Sueanne Margarita, MD  Cardiologist:  No primary care provider on file.    Referring MD: Josetta Huddle, MD   Chief Complaint  Patient presents with  . Coronary Artery Disease  . Hypertension  . Hyperlipidemia    History of Present Illness:    Jerry Wood is a 86 y.o. male with a hx of chronic atrial fibrillation, ASCAD, HTN, dyslipidemia and tachybrady syndrome s/p PPM.  He is here today for followup and is doing well.  He denies any chest pain or pressure,  PND, orthopnea, dizziness, palpitations or syncope. He has chronic DOE with significant activity that has been related to sedentary state, deconditioning and obesity.  He has chronic LE edema due to chronic venous stasis and dependent edema.  He is compliant with his meds and is tolerating meds with no SE.    Past Medical History:  Diagnosis Date  . Allergic rhinitis   . Cancer ([place]) 09/2015   skin ,basal cell  . Chronic knee pain   . Constipation   . COPD, mild ([place])   . Coronary artery disease    s/p PCI of the RCA  . Diverticulosis   . Dyslipidemia   . Gout   . Hypertension   . Long term (current) use of anticoagulants   . Onychomycosis   . 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 Parkinson's disease ([place]) 03/2012   Dr Vilinda Blanks started  . Permanent atrial fibrillation (HCC)    Chronic. ECHO 06/04/11 LVEF estimated by 2D at 52.1%  . Pure hypercholesterolemia   . Seborrheic keratosis, inflamed   . Skin lesion    Plan to remove left posterior ear lesion with electrocautery  . Tachy-brady syndrome [facility])    s/p permanent pacemaker placement  . Unspecified hypothyroidism   . Vertigo   . Vitamin D deficiency     Past Surgical History:  Procedure Laterality Date  . FRACTURE SURGERY Right    clavicle  . INGUINAL HERNIA REPAIR Bilateral   . LAPAROSCOPIC PARTIAL COLECTOMY     Partial resection of sigmoid colon  . PACEMAKER PLACEMENT  2003   Tachybradycardia syndrome. Gen change 07/29/08, Dr. Leonia Reeves  . TYMPANOPLASTY Bilateral     Current Medications: Current Meds  Medication Sig  . apixaban (ELIQUIS) 5 MG TABS tablet Take 1 tablet (5 mg total) by mouth 2 (two) times daily.  . carbidopa-levodopa (SINEMET IR) 25-100 MG tablet Take 2 tablets by mouth 3 (three) times daily.  . Cholecalciferol (VITAMIN D3) 2000 UNITS TABS Take 2,000 Units by mouth daily.  Marland Kitchen diltiazem (CARDIZEM CD) 120 MG 24 hr capsule TAKE ONE CAPSULE BY MOUTH DAILY. PLEASE MAKE YEARLY APPOINTMENT WITH DOCTOR Suliman Termini FOR DECEMBER.  Mariane Baumgarten Calcium (STOOL SOFTENER PO) Take 100 mg by mouth as directed.  . ezetimibe (ZETIA) 10 MG tablet TAKE 1 TABLET BY MOUTH DAILY  . fexofenadine (ALLEGRA) 60 MG tablet Take 60 mg by mouth daily as needed for allergies.   . furosemide (LASIX) 80 MG tablet Take 80 mg by mouth 2 (two) times daily.   Marland Kitchen levothyroxine (SYNTHROID, LEVOTHROID) 75 MCG tablet Take 75 mcg by mouth daily before breakfast.   . losartan (COZAAR) 50 MG tablet Take 1 tablet (50 mg total) by mouth daily.  . Multiple Vitamin (  MULTIVITAMIN) capsule Take 1 capsule by mouth daily.  . Multiple Vitamins-Minerals (PRESERVISION AREDS) CAPS Take 1 capsule by mouth daily.   . potassium chloride SA (K-DUR,KLOR-CON) 20 MEQ tablet Take 30 mEq by mouth daily.  . pravastatin (PRAVACHOL) 40 MG tablet TAKE 1 TABLET BY MOUTH EVERY DAY  . vitamin B-12 (CYANOCOBALAMIN) 1000 MCG tablet Take 1,000 mcg by mouth daily.     Allergies:   Patient has no known allergies.   Social History   Socioeconomic History  . Marital status: Widowed    Spouse name: Not on file  . Number of children: 2  . Years of education: 7TH  . Highest education level: Not on file  Occupational History  . Not on file  Tobacco Use  . Smoking status: Former  Smoker    Years: 10.00    Types: Cigarettes    Quit date: 06/18/1948    Years since quitting: 71.2  . Smokeless tobacco: Never Used  Substance and Sexual Activity  . Alcohol use: No  . Drug use: No  . Sexual activity: Not on file  Other Topics Concern  . Not on file  Social History Narrative   Patient lives at home with family.   Caffeine Use: rarely   Social Determinants of Radio broadcast assistant Strain:   . Difficulty of Paying Living Expenses:   Food Insecurity:   . Worried About Charity fundraiser in the Last Year:   . Arboriculturist in the Last Year:   Transportation Needs:   . Film/video editor (Medical):   Marland Kitchen Lack of Transportation (Non-Medical):   Physical Activity:   . Days of Exercise per Week:   . Minutes of Exercise per Session:   Stress:   . Feeling of Stress :   Social Connections:   . Frequency of Communication with Friends and Family:   . Frequency of Social Gatherings with Friends and Family:   . Attends Religious Services:   . Active Member of Clubs or Organizations:   . Attends Archivist Meetings:   Marland Kitchen Marital Status:      Family History: The patient's family history includes Bladder Cancer in an other family member; CAD in his father; Diabetes in his father; Leukemia in an other family member.  ROS:   Please see the history of present illness.    ROS  All other systems reviewed and negative.   EKGs/Labs/Other Studies Reviewed:    The following studies were reviewed today: Outside labs  EKG:  EKG is not ordered today.    Recent Labs: No results found for requested labs within last 8760 hours.   Recent Lipid Panel    Component Value Date/Time   CHOL 148 04/24/2017 0803   TRIG 177 (H) 04/24/2017 0803   HDL 39 (L) 04/24/2017 0803   CHOLHDL 3.8 04/24/2017 0803   CHOLHDL 3.8 03/16/2016 0829   VLDL 27 03/16/2016 0829   LDLCALC 74 04/24/2017 0803    Physical Exam:    VS:  BP 124/68   Pulse 72   Ht 5\' 1"  (1.549 m)    Wt 152 lb (68.9 kg)   SpO2 97%   BMI 28.72 kg/m     Wt Readings from Last 3 Encounters:  08/27/19 152 lb (68.9 kg)  07/15/19 154 lb (69.9 kg)  05/22/18 157 lb 6.4 oz (71.4 kg)     GEN:  Well nourished, well developed in no acute distress HEENT: Normal NECK: No JVD; No  carotid bruits LYMPHATICS: No lymphadenopathy CARDIAC: irregularly irregular, no murmurs, rubs, gallops RESPIRATORY:  Few scattered crackles at bases improved with cough ABDOMEN: Soft, non-tender, non-distended MUSCULOSKELETAL: 1+ pedal  edema; No deformity  SKIN: Warm and dry NEUROLOGIC:  Alert and oriented x 3 PSYCHIATRIC:  Normal affect   ASSESSMENT:    1. Permanent atrial fibrillation ([place])   2. Pure hypercholesterolemia   3. Essential hypertension   4. Chronic diastolic CHF (congestive heart failure) ([place])   5. Tachycardia-bradycardia ([place])   6. Atherosclerosis of native coronary artery of native heart without angina pectoris    PLAN:    In order of problems listed above:  1.  Permanent atrial fibrillation -his HR is well controlled in afib -he denies any palpitations -he has not had any bleeding problems on DOAC -continue Cardizem CD 120mg  daily for HR control and Apixaban 5mg  BID -check CMET and CBC  2.  HLD -LDL goal < 70 -check FLP -continue Pravastatin 40mg  daily and Zetia 10mg  daily  3. HTN -BP controlled -continue Cardizem CD 120mg  daily, Losartan 50mg  daily -check BMET  4.  Chronic diastolic CHF -he has chronic LE edema which is stable -faint scattered crackles at bases that improve with cough likely atelectasis -weight is stable -continue Lasix 80mg  BID -check BMET  5.  Tachy-Brady Syndrome -s/p PPM -followed in device clinic  6.  ASCAD -s/p PCI of the RCA remotely -denies any anginal sx -no ASA due to DOAC -continue statin   Medication Adjustments/Labs and Tests Ordered: Current medicines are reviewed at length with the patient today.  Concerns regarding medicines  are outlined above.  Orders Placed This Encounter  Procedures  . CBC  . Comprehensive metabolic panel  . Lipid panel   No orders of the defined types were placed in this encounter.   Signed, Fransico Him, MD  08/27/2019 5:56 PM    [place]

## 2019-08-27 NOTE — Patient Instructions (Signed)
Medication Instructions:  Your physician recommends that you continue on your current medications as directed. Please refer to the Current Medication list given to you today.  *If you need a refill on your cardiac medications before your next appointment, please call your pharmacy*   Lab Work: TODAY: CMET, CBC, FLP If you have labs (blood work) drawn today and your tests are completely normal, you will receive your results only by: Marland Kitchen MyChart Message (if you have MyChart) OR . A paper copy in the mail If you have any lab test that is abnormal or we need to change your treatment, we will call you to review the results.   Follow-Up: At [facility], you and your health needs are our priority.  As part of our continuing mission to provide you with exceptional heart care, we have created designated Provider Care Teams.  These Care Teams include your primary Cardiologist (physician) and Advanced Practice Providers (APPs -  Physician Assistants and Nurse Practitioners) who all work together to provide you with the care you need, when you need it.  We recommend signing up for the patient portal called "MyChart".  Sign up information is provided on this After Visit Summary.  MyChart is used to connect with patients for Virtual Visits (Telemedicine).  Patients are able to view lab/test results, encounter notes, upcoming appointments, etc.  Non-urgent messages can be sent to your provider as well.   To learn more about what you can do with MyChart, go to [email or web address].    Your next appointment:   6 month(s)  The format for your next appointment:   In Person  Provider:   Fransico Him, MD   Other Instructions  Low-Sodium Eating Plan Sodium, which is an element that makes up salt, helps you maintain a healthy balance of fluids in your body. Too much sodium can increase your blood pressure and cause fluid and waste to be held in your body. Your health care provider or dietitian may  recommend following this plan if you have high blood pressure (hypertension), kidney disease, liver disease, or heart failure. Eating less sodium can help lower your blood pressure, reduce swelling, and protect your heart, liver, and kidneys. What are tips for following this plan? General guidelines  Most people on this plan should limit their sodium intake to 1,500-2,000 mg (milligrams) of sodium each day. Reading food labels   The Nutrition Facts label lists the amount of sodium in one serving of the food. If you eat more than one serving, you must multiply the listed amount of sodium by the number of servings.  Choose foods with less than 140 mg of sodium per serving.  Avoid foods with 300 mg of sodium or more per serving. Shopping  Look for lower-sodium products, often labeled as "low-sodium" or "no salt added."  Always check the sodium content even if foods are labeled as "unsalted" or "no salt added".  Buy fresh foods. ? Avoid canned foods and premade or frozen meals. ? Avoid canned, cured, or processed meats  Buy breads that have less than 80 mg of sodium per slice. Cooking  Eat more home-cooked food and less restaurant, buffet, and fast food.  Avoid adding salt when cooking. Use salt-free seasonings or herbs instead of table salt or sea salt. Check with your health care provider or pharmacist before using salt substitutes.  Cook with plant-based oils, such as canola, sunflower, or olive oil. Meal planning  When eating at a restaurant, ask that your food be  prepared with less salt or no salt, if possible.  Avoid foods that contain MSG (monosodium glutamate). MSG is sometimes added to [place] food, bouillon, and some canned foods. What foods are recommended? The items listed may not be a complete list. Talk with your dietitian about what dietary choices are best for you. Grains Low-sodium cereals, including oats, puffed wheat and rice, and shredded wheat. Low-sodium  crackers. Unsalted rice. Unsalted pasta. Low-sodium bread. Whole-grain breads and whole-grain pasta. Vegetables Fresh or frozen vegetables. "No salt added" canned vegetables. "No salt added" tomato sauce and paste. Low-sodium or reduced-sodium tomato and vegetable juice. Fruits Fresh, frozen, or canned fruit. Fruit juice. Meats and other protein foods Fresh or frozen (no salt added) meat, poultry, seafood, and fish. Low-sodium canned tuna and salmon. Unsalted nuts. Dried peas, beans, and lentils without added salt. Unsalted canned beans. Eggs. Unsalted nut butters. Dairy Milk. Soy milk. Cheese that is naturally low in sodium, such as ricotta cheese, fresh mozzarella, or Swiss cheese Low-sodium or reduced-sodium cheese. Cream cheese. Yogurt. Fats and oils Unsalted butter. Unsalted margarine with no trans fat. Vegetable oils such as canola or olive oils. Seasonings and other foods Fresh and dried herbs and spices. Salt-free seasonings. Low-sodium mustard and ketchup. Sodium-free salad dressing. Sodium-free light mayonnaise. Fresh or refrigerated horseradish. Lemon juice. Vinegar. Homemade, reduced-sodium, or low-sodium soups. Unsalted popcorn and pretzels. Low-salt or salt-free chips. What foods are not recommended? The items listed may not be a complete list. Talk with your dietitian about what dietary choices are best for you. Grains Instant hot cereals. Bread stuffing, pancake, and biscuit mixes. Croutons. Seasoned rice or pasta mixes. Noodle soup cups. Boxed or frozen macaroni and cheese. Regular salted crackers. Self-rising flour. Vegetables Sauerkraut, pickled vegetables, and relishes. Olives. [place] fries. Onion rings. Regular canned vegetables (not low-sodium or reduced-sodium). Regular canned tomato sauce and paste (not low-sodium or reduced-sodium). Regular tomato and vegetable juice (not low-sodium or reduced-sodium). Frozen vegetables in sauces. Meats and other protein foods Meat or  fish that is salted, canned, smoked, spiced, or pickled. Bacon, ham, sausage, hotdogs, corned beef, chipped beef, packaged lunch meats, salt pork, jerky, pickled herring, anchovies, regular canned tuna, sardines, salted nuts. Dairy Processed cheese and cheese spreads. Cheese curds. Blue cheese. Feta cheese. String cheese. Regular cottage cheese. Buttermilk. Canned milk. Fats and oils Salted butter. Regular margarine. Ghee. Bacon fat. Seasonings and other foods Onion salt, garlic salt, seasoned salt, table salt, and sea salt. Canned and packaged gravies. Worcestershire sauce. Tartar sauce. Barbecue sauce. Teriyaki sauce. Soy sauce, including reduced-sodium. Steak sauce. Fish sauce. Oyster sauce. Cocktail sauce. Horseradish that you find on the shelf. Regular ketchup and mustard. Meat flavorings and tenderizers. Bouillon cubes. Hot sauce and Tabasco sauce. Premade or packaged marinades. Premade or packaged taco seasonings. Relishes. Regular salad dressings. Salsa. Potato and tortilla chips. Corn chips and puffs. Salted popcorn and pretzels. Canned or dried soups. Pizza. Frozen entrees and pot pies. Summary  Eating less sodium can help lower your blood pressure, reduce swelling, and protect your heart, liver, and kidneys.  Most people on this plan should limit their sodium intake to 1,500-2,000 mg (milligrams) of sodium each day.  Canned, boxed, and frozen foods are high in sodium. Restaurant foods, fast foods, and pizza are also very high in sodium. You also get sodium by adding salt to food.  Try to cook at home, eat more fresh fruits and vegetables, and eat less fast food, canned, processed, or prepared foods. This information is not intended to  replace advice given to you by your health care provider. Make sure you discuss any questions you have with your health care provider. Document Revised: 05/17/2017 Document Reviewed: 05/28/2016 Elsevier Patient Education  2020 [employer].

## 2019-08-28 DIAGNOSIS — I495 Sick sinus syndrome: Secondary | ICD-10-CM | POA: Diagnosis not present

## 2019-08-28 LAB — LIPID PANEL
Chol/HDL Ratio: 2.5 ratio (ref 0.0–5.0)
Cholesterol, Total: 107 mg/dL (ref 100–199)
HDL: 42 mg/dL (ref >39)
LDL Chol Calc (NIH): 45 mg/dL (ref 0–99)
Triglycerides: 106 mg/dL (ref 0–149)
VLDL Cholesterol Cal: 20 mg/dL (ref 5–40)

## 2019-08-28 LAB — COMPREHENSIVE METABOLIC PANEL
ALT: 9 IU/L (ref 0–44)
AST: 15 IU/L (ref 0–40)
Albumin/Globulin Ratio: 1.4 (ref 1.2–2.2)
Albumin: 3.8 g/dL (ref 3.5–4.6)
Alkaline Phosphatase: 126 IU/L — ABNORMAL HIGH (ref 39–117)
BUN/Creatinine Ratio: 18 (ref 10–24)
BUN: 20 mg/dL (ref 10–36)
Bilirubin Total: 0.5 mg/dL (ref 0.0–1.2)
CO2: 23 mmol/L (ref 20–29)
Calcium: 8.8 mg/dL (ref 8.6–10.2)
Chloride: 105 mmol/L (ref 96–106)
Creatinine, Ser: 1.1 mg/dL (ref 0.76–1.27)
GFR calc Af Amer: 66 mL/min/{1.73_m2} (ref >59)
GFR calc non Af Amer: 57 mL/min/{1.73_m2} — ABNORMAL LOW (ref >59)
Globulin, Total: 2.7 g/dL (ref 1.5–4.5)
Glucose: 98 mg/dL (ref 65–99)
Potassium: 4.1 mmol/L (ref 3.5–5.2)
Sodium: 146 mmol/L — ABNORMAL HIGH (ref 134–144)
Total Protein: 6.5 g/dL (ref 6.0–8.5)

## 2019-08-28 LAB — CBC
Hematocrit: 34.2 % — ABNORMAL LOW (ref 37.5–51.0)
Hemoglobin: 11.4 g/dL — ABNORMAL LOW (ref 13.0–17.7)
MCH: 31.9 pg (ref 26.6–33.0)
MCHC: 33.3 g/dL (ref 31.5–35.7)
MCV: 96 fL (ref 79–97)
Platelets: 290 10*3/uL (ref 150–450)
RBC: 3.57 x10E6/uL — ABNORMAL LOW (ref 4.14–5.80)
RDW: 13 % (ref 11.6–15.4)
WBC: 9.2 10*3/uL (ref 3.4–10.8)

## 2019-09-02 DIAGNOSIS — E785 Hyperlipidemia, unspecified: Secondary | ICD-10-CM | POA: Diagnosis not present

## 2019-09-02 DIAGNOSIS — I2510 Atherosclerotic heart disease of native coronary artery without angina pectoris: Secondary | ICD-10-CM | POA: Diagnosis not present

## 2019-09-02 DIAGNOSIS — J449 Chronic obstructive pulmonary disease, unspecified: Secondary | ICD-10-CM | POA: Diagnosis not present

## 2019-09-02 DIAGNOSIS — E039 Hypothyroidism, unspecified: Secondary | ICD-10-CM | POA: Diagnosis not present

## 2019-09-09 ENCOUNTER — Other Ambulatory Visit: Payer: Self-pay | Admitting: Internal Medicine

## 2019-09-28 DIAGNOSIS — I2510 Atherosclerotic heart disease of native coronary artery without angina pectoris: Secondary | ICD-10-CM | POA: Diagnosis not present

## 2019-09-28 DIAGNOSIS — J449 Chronic obstructive pulmonary disease, unspecified: Secondary | ICD-10-CM | POA: Diagnosis not present

## 2019-09-28 DIAGNOSIS — E782 Mixed hyperlipidemia: Secondary | ICD-10-CM | POA: Diagnosis not present

## 2019-09-28 DIAGNOSIS — E785 Hyperlipidemia, unspecified: Secondary | ICD-10-CM | POA: Diagnosis not present

## 2019-10-06 ENCOUNTER — Ambulatory Visit: Payer: Medicare Other | Admitting: Diagnostic Neuroimaging

## 2019-10-23 ENCOUNTER — Other Ambulatory Visit: Payer: Self-pay | Admitting: Pharmacist

## 2019-10-23 MED ORDER — APIXABAN 5 MG PO TABS: 5.0000 mg | ORAL_TABLET | Freq: Two times a day (BID) | ORAL | 6 refills | Status: DC

## 2019-10-23 NOTE — Telephone Encounter (Signed)
Last office visit 07/15/19.  Age 86, Scr 1.1 (08/27/19), Weight: 68.9 kg.  Rx refill sent

## 2019-11-17 ENCOUNTER — Ambulatory Visit
Admission: RE | Admit: 2019-11-17 | Discharge: 2019-11-17 | Disposition: A | Discharge status: Home / Self Care | Payer: Medicare Other | Source: Ambulatory Visit | Attending: Geriatric Medicine | Admitting: Geriatric Medicine

## 2019-11-17 ENCOUNTER — Other Ambulatory Visit: Payer: Self-pay | Admitting: Geriatric Medicine

## 2019-11-17 ENCOUNTER — Other Ambulatory Visit: Payer: Self-pay

## 2019-11-17 DIAGNOSIS — M25551 Pain in right hip: Secondary | ICD-10-CM | POA: Diagnosis not present

## 2019-11-17 DIAGNOSIS — L89312 Pressure ulcer of right buttock, stage 2: Secondary | ICD-10-CM | POA: Diagnosis not present

## 2019-11-20 DIAGNOSIS — L03317 Cellulitis of buttock: Secondary | ICD-10-CM | POA: Diagnosis not present

## 2019-11-30 DIAGNOSIS — M25551 Pain in right hip: Secondary | ICD-10-CM | POA: Diagnosis not present

## 2019-11-30 DIAGNOSIS — I2510 Atherosclerotic heart disease of native coronary artery without angina pectoris: Secondary | ICD-10-CM | POA: Diagnosis not present

## 2019-11-30 DIAGNOSIS — L03317 Cellulitis of buttock: Secondary | ICD-10-CM | POA: Diagnosis not present

## 2019-11-30 DIAGNOSIS — E039 Hypothyroidism, unspecified: Secondary | ICD-10-CM | POA: Diagnosis not present

## 2019-12-01 DIAGNOSIS — L03317 Cellulitis of buttock: Secondary | ICD-10-CM | POA: Diagnosis not present

## 2019-12-15 ENCOUNTER — Encounter: Payer: Self-pay | Admitting: Internal Medicine

## 2019-12-15 DIAGNOSIS — I495 Sick sinus syndrome: Secondary | ICD-10-CM

## 2019-12-29 ENCOUNTER — Other Ambulatory Visit: Payer: Self-pay | Admitting: Diagnostic Neuroimaging

## 2020-01-01 DIAGNOSIS — H6120 Impacted cerumen, unspecified ear: Secondary | ICD-10-CM | POA: Diagnosis not present

## 2020-01-01 DIAGNOSIS — Z950 Presence of cardiac pacemaker: Secondary | ICD-10-CM | POA: Diagnosis not present

## 2020-01-01 DIAGNOSIS — I2510 Atherosclerotic heart disease of native coronary artery without angina pectoris: Secondary | ICD-10-CM | POA: Diagnosis not present

## 2020-01-01 DIAGNOSIS — J449 Chronic obstructive pulmonary disease, unspecified: Secondary | ICD-10-CM | POA: Diagnosis not present

## 2020-01-01 DIAGNOSIS — I110 Hypertensive heart disease with heart failure: Secondary | ICD-10-CM | POA: Diagnosis not present

## 2020-01-01 DIAGNOSIS — I482 Chronic atrial fibrillation, unspecified: Secondary | ICD-10-CM | POA: Diagnosis not present

## 2020-01-01 DIAGNOSIS — I5032 Chronic diastolic (congestive) heart failure: Secondary | ICD-10-CM | POA: Diagnosis not present

## 2020-01-01 DIAGNOSIS — Z7901 Long term (current) use of anticoagulants: Secondary | ICD-10-CM | POA: Diagnosis not present

## 2020-01-01 DIAGNOSIS — E782 Mixed hyperlipidemia: Secondary | ICD-10-CM | POA: Diagnosis not present

## 2020-01-01 DIAGNOSIS — M109 Gout, unspecified: Secondary | ICD-10-CM | POA: Diagnosis not present

## 2020-01-01 DIAGNOSIS — E039 Hypothyroidism, unspecified: Secondary | ICD-10-CM | POA: Diagnosis not present

## 2020-01-01 DIAGNOSIS — Z85828 Personal history of other malignant neoplasm of skin: Secondary | ICD-10-CM | POA: Diagnosis not present

## 2020-01-01 DIAGNOSIS — I509 Heart failure, unspecified: Secondary | ICD-10-CM | POA: Diagnosis not present

## 2020-01-01 DIAGNOSIS — Z951 Presence of aortocoronary bypass graft: Secondary | ICD-10-CM | POA: Diagnosis not present

## 2020-01-01 DIAGNOSIS — G20 Parkinson's disease: Secondary | ICD-10-CM | POA: Diagnosis not present

## 2020-01-13 DIAGNOSIS — I4891 Unspecified atrial fibrillation: Secondary | ICD-10-CM | POA: Diagnosis not present

## 2020-01-13 DIAGNOSIS — I2510 Atherosclerotic heart disease of native coronary artery without angina pectoris: Secondary | ICD-10-CM | POA: Diagnosis not present

## 2020-01-13 DIAGNOSIS — I509 Heart failure, unspecified: Secondary | ICD-10-CM | POA: Diagnosis not present

## 2020-01-13 DIAGNOSIS — E039 Hypothyroidism, unspecified: Secondary | ICD-10-CM | POA: Diagnosis not present

## 2020-01-31 DIAGNOSIS — I2510 Atherosclerotic heart disease of native coronary artery without angina pectoris: Secondary | ICD-10-CM | POA: Diagnosis not present

## 2020-01-31 DIAGNOSIS — I482 Chronic atrial fibrillation, unspecified: Secondary | ICD-10-CM | POA: Diagnosis not present

## 2020-01-31 DIAGNOSIS — J449 Chronic obstructive pulmonary disease, unspecified: Secondary | ICD-10-CM | POA: Diagnosis not present

## 2020-01-31 DIAGNOSIS — E039 Hypothyroidism, unspecified: Secondary | ICD-10-CM | POA: Diagnosis not present

## 2020-01-31 DIAGNOSIS — I110 Hypertensive heart disease with heart failure: Secondary | ICD-10-CM | POA: Diagnosis not present

## 2020-01-31 DIAGNOSIS — H6120 Impacted cerumen, unspecified ear: Secondary | ICD-10-CM | POA: Diagnosis not present

## 2020-01-31 DIAGNOSIS — M109 Gout, unspecified: Secondary | ICD-10-CM | POA: Diagnosis not present

## 2020-01-31 DIAGNOSIS — G20 Parkinson's disease: Secondary | ICD-10-CM | POA: Diagnosis not present

## 2020-01-31 DIAGNOSIS — Z85828 Personal history of other malignant neoplasm of skin: Secondary | ICD-10-CM | POA: Diagnosis not present

## 2020-01-31 DIAGNOSIS — I5032 Chronic diastolic (congestive) heart failure: Secondary | ICD-10-CM | POA: Diagnosis not present

## 2020-01-31 DIAGNOSIS — I509 Heart failure, unspecified: Secondary | ICD-10-CM | POA: Diagnosis not present

## 2020-01-31 DIAGNOSIS — Z951 Presence of aortocoronary bypass graft: Secondary | ICD-10-CM | POA: Diagnosis not present

## 2020-01-31 DIAGNOSIS — Z950 Presence of cardiac pacemaker: Secondary | ICD-10-CM | POA: Diagnosis not present

## 2020-01-31 DIAGNOSIS — Z7901 Long term (current) use of anticoagulants: Secondary | ICD-10-CM | POA: Diagnosis not present

## 2020-01-31 DIAGNOSIS — E782 Mixed hyperlipidemia: Secondary | ICD-10-CM | POA: Diagnosis not present

## 2020-02-01 ENCOUNTER — Ambulatory Visit: Payer: Medicare Other | Admitting: Diagnostic Neuroimaging

## 2020-03-09 DIAGNOSIS — I2510 Atherosclerotic heart disease of native coronary artery without angina pectoris: Secondary | ICD-10-CM | POA: Diagnosis not present

## 2020-03-09 DIAGNOSIS — I509 Heart failure, unspecified: Secondary | ICD-10-CM | POA: Diagnosis not present

## 2020-03-09 DIAGNOSIS — I4891 Unspecified atrial fibrillation: Secondary | ICD-10-CM | POA: Diagnosis not present

## 2020-03-09 DIAGNOSIS — E039 Hypothyroidism, unspecified: Secondary | ICD-10-CM | POA: Diagnosis not present

## 2020-03-14 ENCOUNTER — Encounter: Payer: Self-pay | Admitting: Diagnostic Neuroimaging

## 2020-03-14 ENCOUNTER — Ambulatory Visit: Payer: Medicare Other | Admitting: Diagnostic Neuroimaging

## 2020-03-14 ENCOUNTER — Other Ambulatory Visit: Payer: Self-pay

## 2020-03-14 VITALS — BP 109/80 | HR 72 | Ht 61.0 in

## 2020-03-14 DIAGNOSIS — G20 Parkinson's disease: Secondary | ICD-10-CM | POA: Diagnosis not present

## 2020-03-14 MED ORDER — CARBIDOPA-LEVODOPA 25-100 MG PO TABS: 2.0000 | ORAL_TABLET | Freq: Three times a day (TID) | ORAL | 4 refills | Status: DC

## 2020-03-14 NOTE — Patient Instructions (Signed)
  PARKINSON'S DISEASE (stable) - continue carb/levo 1 tab three times a day  - caution with balance and fall risk

## 2020-03-14 NOTE — Progress Notes (Signed)
Chief Complaint  Patient presents with  . Parkinson's disease     rm 9, one year FU, dgtr- [place] Latino "stable"       History of Present Illness:  UPDATE (03/14/20, VRP): Since last visit, doing well. Symptoms are stable. Severity is mild. No alleviating or aggravating factors. Tolerating carb/levo.  UPDATE (09/24/17, VRP): Since last visit, doing well. Tolerating carb/levo. No alleviating or aggravating factors. No falls. No new issues.   UPDATE 09/10/16: Since last visit, doing well. No major changes. Tolerating meds. Mainly in wheelchair. No falls. Daughter helping at home. Continues with speech diff, dizziness, gait diff. No other alleviating or aggravating factors.   UPDATE 09/12/15: Since last visit, doing about the same to slightly progressing with PD. No falls. Mainly in wheelchair. Uses cane occ to transfer, but needs help. Daughter helps with ADLs.   UPDATE 03/15/15: Since last visit, patient doing well. No falls. Tolerating meds. Some swallow issues with larger pills. Doing ok with foods.  UPDATE 09/09/14: Since last visit, doing well. Tremor stable. Notes some internal shaking sensation (abdomen) without anxiety, SOB or CP. Tolerating carb/levo 25/100 2 tabs TID.   UPDATE 03/11/14 (VRP): Since last visit, doing better with tremor control on higher carb/levo (2 tabs PO TID). Able to transfer from wheelchair to bed and commode on his own. Walks short distance with walker, but very unsteady.   UPDATE 07/17/13 (LL):  Since last visit, he increased Sinemet to 1.5 tablets and then to 2 tablets TID.   Since being at 2 tablets TID, he states that sometimes it is harder for him to speak; get his words out.  Tremors are still bothersome, and and there is now akathisia with his tongue and jaw. The tremor makes it difficult for him to get to sleep, but when he does sleep, he sleeps well.  Denies dreaming or hallucinations.   UPDATE 01/14/13 (LL): Patient comes in for follow up for Parkinson's  Disease. Patient reports that his tremor is worse in both upper extremities, it makes it difficult for him to get to sleep at night. It does not hinder his ability to feed himself. No problem with taste, smell or appetite. Denies constipation, has some urinary urgency. Takes dose of Carb/Levo at 7; 12 and 4. Feels like medication helped at first but now it's not. No side effects from med. He goes to sleep at 10:30 pm. Denies dreaming or hallucinations.   UPDATE 07/15/12 (VRP): Since last visit, carb/levo has helped his tremor. Now able to fall asleep better because tremor is better controlled. No side effects from medications. Satisfied with the results so far.   Prior HPI (03/26/2012) (VRP): 86 year old right-handed male with history of atrial fibrillation, hypertension, hypothyroidism, hypercholesterolemia, coronary artery disease, here for evaluation of gait difficulty and tremor. Evaluate for possible Parkinson disease. In 2010, patient began to develop intermittent left upper extremity resting tremor. Tremor progressed to more continuous and bilateral tremor. He does not have significant difficulty when he is doing actions. In addition by 2011 he began to use a cane for walking imbalance. In 2012 he was using a walker. In 2013 he's been using a wheelchair most of the time. He is able to take a few steps with assistance to the bathroom. Of note patient's voice has become more quite over the past 2 years. No constipation, sleep disturbance, anxiety or smell or taste difficulty. His tremors worsened nighttime. No family history of tremor.    Observations/Objective:  Vitals with BMI 03/14/2020  08/27/2019 07/15/2019  Height 5\' 1"  5\' 1"  5\' 1"   Weight (No Data) 152 lbs 154 lbs  BMI - 01.02 72.53  Systolic 664 403 -  Diastolic 80 68 -  Pulse 72 72 80     Generalized: In no acute distress, pleasant male in wheelchair  Neck: Supple, no carotid bruits  Cardiac: Irregular rate rhythm, no murmur    Neurological examination  Mentation: Awake and alert, language fluent, comprehension intact.  Cranial nerve II-XII: Pupils were equal round reactive to light extraocular movements were full, visual field were full on confrontational test. facial sensation and strength were normal. hearing was intact to finger rubbing bilaterally. Uvula tongue midline. head turning and shoulder shrug and were normal and symmetric.Tongue protrusion into cheek strength was normal.   MOTOR: NO COGWHEELING. MILD BRADYKINESIA IN BUE and BLE. Normal bulk. BUE 3-4. BLE 3-4. No pronator drift.  SENSORY: normal and symmetric to light touch. ABSENT VIB AT TOES AND ANKLES.  COORDINATION: BUE ACTION TREMOR.  REFLEXES: Deep tendon reflexes in the upper and lower extremities are TRACE and symmetric.  GAIT/STATION: IN WHEELCHAIR   Assessment and Plan:  86 y.o. right-handed male with history of atrial fibrillation, hypertension, hypothyroidism, hypercholesteremia, coronary artery disease, here for evaluation of gait difficulty and tremor. He has 4/4 cardinal signs of Parkinson's disease. Stable on carb/levo 1 tab three times a day, but patient has advanced parkinson's disease, gradually progression, and now patient is almost completely wheelchair bound.  Dx:  1. Parkinson disease ([place])      PARKINSON'S DISEASE (stable) - continue carb/levo 2 tabs three times a day  - caution with balance and fall risk  Meds ordered this encounter  Medications  . carbidopa-levodopa (SINEMET IR) 25-100 MG tablet    Sig: Take 2 tablets by mouth 3 (three) times daily.    Dispense:  540 tablet    Refill:  4     Follow Up Instructions:  - Return in about 1 year (around 03/14/2021) for with NP (Amy Lomax), virtual visit (15 min).       Penni Bombard, MD 4/74/2595, 6:38 PM Certified in Neurology, Neurophysiology and Neuroimaging  [facility] [address], [place],  [address] [phone]

## 2020-03-28 DIAGNOSIS — E039 Hypothyroidism, unspecified: Secondary | ICD-10-CM | POA: Diagnosis not present

## 2020-03-28 DIAGNOSIS — I509 Heart failure, unspecified: Secondary | ICD-10-CM | POA: Diagnosis not present

## 2020-03-28 DIAGNOSIS — E782 Mixed hyperlipidemia: Secondary | ICD-10-CM | POA: Diagnosis not present

## 2020-03-28 DIAGNOSIS — J449 Chronic obstructive pulmonary disease, unspecified: Secondary | ICD-10-CM | POA: Diagnosis not present

## 2020-04-15 ENCOUNTER — Other Ambulatory Visit: Payer: Self-pay | Admitting: Cardiology

## 2020-04-15 ENCOUNTER — Other Ambulatory Visit: Payer: Self-pay | Admitting: Physician Assistant

## 2020-04-15 NOTE — Telephone Encounter (Signed)
Outpatient Medication Detail   Disp Refills Start End   losartan (COZAAR) 50 MG tablet 30 tablet 0 04/15/2020    Sig: TAKE 1 TABLET(50 MG) BY MOUTH DAILY   Sent to pharmacy as: losartan (COZAAR) 50 MG tablet   Notes to Pharmacy: PT IS OVERDUE FOR AN APPT.. WILL AUTHORIZE 30 DAYS SUPPLY.Marland Kitchen PLEASE HAVE PT CALL TOS CHEDULE   E-Prescribing Status: Receipt confirmed by pharmacy (04/15/2020  7:45 AM EDT)   [place], Baring Jackson

## 2020-05-16 ENCOUNTER — Other Ambulatory Visit: Payer: Self-pay | Admitting: Cardiology

## 2020-05-24 ENCOUNTER — Encounter: Payer: Self-pay | Admitting: *Deleted

## 2020-05-24 ENCOUNTER — Telehealth: Payer: Self-pay

## 2020-05-24 NOTE — Telephone Encounter (Signed)
Patient has rec'd a jury summons. Daughter, [name], is calling to see if she can get a letter from MD to excuse patient out of the [employer]. Please call Dawn at [phone]

## 2020-05-24 NOTE — Telephone Encounter (Signed)
Excused from jury duty letter on MD desk for review, signature.

## 2020-05-24 NOTE — Telephone Encounter (Signed)
Called daughter, advised the jury duty excused letter is ready. She asked that it be mailed to patient's address. I advised will mail it today. Dawn verbalized understanding, appreciation. Letter placed in mail.

## 2020-06-01 DIAGNOSIS — J449 Chronic obstructive pulmonary disease, unspecified: Secondary | ICD-10-CM | POA: Diagnosis not present

## 2020-06-01 DIAGNOSIS — E039 Hypothyroidism, unspecified: Secondary | ICD-10-CM | POA: Diagnosis not present

## 2020-06-01 DIAGNOSIS — I2510 Atherosclerotic heart disease of native coronary artery without angina pectoris: Secondary | ICD-10-CM | POA: Diagnosis not present

## 2020-06-01 DIAGNOSIS — I509 Heart failure, unspecified: Secondary | ICD-10-CM | POA: Diagnosis not present

## 2020-06-01 DIAGNOSIS — I4891 Unspecified atrial fibrillation: Secondary | ICD-10-CM | POA: Diagnosis not present

## 2020-06-01 DIAGNOSIS — E782 Mixed hyperlipidemia: Secondary | ICD-10-CM | POA: Diagnosis not present

## 2020-06-13 ENCOUNTER — Other Ambulatory Visit: Payer: Self-pay | Admitting: Cardiology

## 2020-06-16 ENCOUNTER — Other Ambulatory Visit: Payer: Self-pay | Admitting: Cardiology

## 2020-06-19 ENCOUNTER — Other Ambulatory Visit: Payer: Self-pay | Admitting: Cardiology

## 2020-06-20 NOTE — Telephone Encounter (Signed)
Pt's age 87, wt 68.9 kg, SCr 1.10, CrCl 38.28, last ov w/ TT 08/27/19.

## 2020-07-15 ENCOUNTER — Other Ambulatory Visit: Payer: Self-pay | Admitting: Cardiology

## 2020-07-15 DIAGNOSIS — E039 Hypothyroidism, unspecified: Secondary | ICD-10-CM | POA: Diagnosis not present

## 2020-07-15 DIAGNOSIS — E785 Hyperlipidemia, unspecified: Secondary | ICD-10-CM | POA: Diagnosis not present

## 2020-07-15 DIAGNOSIS — E782 Mixed hyperlipidemia: Secondary | ICD-10-CM | POA: Diagnosis not present

## 2020-07-15 DIAGNOSIS — I2510 Atherosclerotic heart disease of native coronary artery without angina pectoris: Secondary | ICD-10-CM | POA: Diagnosis not present

## 2020-08-16 ENCOUNTER — Encounter: Payer: Self-pay | Admitting: Cardiology

## 2020-08-16 ENCOUNTER — Ambulatory Visit: Payer: Medicare Other | Admitting: Cardiology

## 2020-08-16 ENCOUNTER — Other Ambulatory Visit: Payer: Self-pay

## 2020-08-16 VITALS — BP 108/60 | HR 73 | Ht 61.0 in | Wt 150.8 lb

## 2020-08-16 DIAGNOSIS — I482 Chronic atrial fibrillation, unspecified: Secondary | ICD-10-CM | POA: Diagnosis not present

## 2020-08-16 DIAGNOSIS — E7800 Pure hypercholesterolemia, unspecified: Secondary | ICD-10-CM | POA: Diagnosis not present

## 2020-08-16 DIAGNOSIS — I10 Essential (primary) hypertension: Secondary | ICD-10-CM

## 2020-08-16 DIAGNOSIS — I495 Sick sinus syndrome: Secondary | ICD-10-CM

## 2020-08-16 DIAGNOSIS — I5032 Chronic diastolic (congestive) heart failure: Secondary | ICD-10-CM | POA: Diagnosis not present

## 2020-08-16 DIAGNOSIS — I2510 Atherosclerotic heart disease of native coronary artery without angina pectoris: Secondary | ICD-10-CM

## 2020-08-16 NOTE — Progress Notes (Signed)
Cardiology Office Note:    Date:  08/16/2020   ID:  Jerry Wood, DOB [birth year], MRN [id number]  PCP:  Josetta Huddle, MD  Cardiologist:  Fransico Him, MD    Referring MD: Josetta Huddle, MD   Chief Complaint  Patient presents with  . Coronary Artery Disease  . Hypertension  . Hyperlipidemia    History of Present Illness:    Jerry Wood is a 87 y.o. male with a hx of chronic atrial fibrillation, ASCAD, HTN, dyslipidemia and tachybrady syndrome s/p PPM. he is here today for followup and is doing well.  He denies any chest pain or pressure, SOB, DOE, PND, orthopnea, LE edema, dizziness, palpitations or syncope. He is compliant with his meds and is tolerating meds with no SE.    Past Medical History:  Diagnosis Date  . Allergic rhinitis   . Cancer ([place]) 09/2015   skin ,basal cell  . Chronic knee pain   . Constipation   . COPD, mild ([place])   . Coronary artery disease    s/p PCI of the RCA  . Diverticulosis   . Dyslipidemia   . Gout   . Hypertension   . Long term (current) use of anticoagulants   . Onychomycosis   . 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 Parkinson's disease ([place]) 03/2012   Dr Vilinda Blanks started  . Permanent atrial fibrillation (HCC)    Chronic. ECHO 06/04/11 LVEF estimated by 2D at 52.1%  . Pure hypercholesterolemia   . Seborrheic keratosis, inflamed   . Skin lesion    Plan to remove left posterior ear lesion with electrocautery  . Tachy-brady syndrome [facility])    s/p permanent pacemaker placement  . Unspecified hypothyroidism   . Vertigo   . Vitamin D deficiency     Past Surgical History:  Procedure Laterality Date  . FRACTURE SURGERY Right    clavicle  . INGUINAL HERNIA REPAIR Bilateral   . LAPAROSCOPIC PARTIAL COLECTOMY     Partial resection of sigmoid colon  . PACEMAKER PLACEMENT  2003   Tachybradycardia syndrome. Gen change  07/29/08, Dr. Leonia Reeves  . TYMPANOPLASTY Bilateral     Current Medications: Current Meds  Medication Sig  . carbidopa-levodopa (SINEMET IR) 25-100 MG tablet Take 2 tablets by mouth 3 (three) times daily.  . Cholecalciferol (VITAMIN D3) 2000 UNITS TABS Take 2,000 Units by mouth daily.  Marland Kitchen diltiazem (CARDIZEM CD) 120 MG 24 hr capsule Take 1 capsule (120 mg total) by mouth daily. Please call to schedule appointment for future refills.  Mariane Baumgarten Calcium (STOOL SOFTENER PO) Take 100 mg by mouth as directed.  Marland Kitchen ELIQUIS 5 MG TABS tablet TAKE 1 TABLET(5 MG) BY MOUTH TWICE DAILY  . ezetimibe (ZETIA) 10 MG tablet TAKE 1 TABLET BY MOUTH DAILY  . fexofenadine (ALLEGRA) 60 MG tablet Take 60 mg by mouth daily as needed for allergies.   . furosemide (LASIX) 80 MG tablet Take 80 mg by mouth 2 (two) times daily.   Marland Kitchen levothyroxine (SYNTHROID, LEVOTHROID) 75 MCG tablet Take 75 mcg by mouth daily before breakfast.   . losartan (COZAAR) 50 MG tablet TAKE 1 TABLET(50 MG) BY MOUTH DAILY  . Multiple Vitamin (MULTIVITAMIN) capsule Take 1 capsule by mouth daily.  . Multiple Vitamins-Minerals (PRESERVISION AREDS) CAPS Take 1 capsule by mouth daily.   . potassium chloride SA (K-DUR,KLOR-CON) 20 MEQ tablet Take 30 mEq by mouth daily.  Marland Kitchen  pravastatin (PRAVACHOL) 40 MG tablet TAKE 1 TABLET BY MOUTH EVERY DAY  . vitamin B-12 (CYANOCOBALAMIN) 1000 MCG tablet Take 1,000 mcg by mouth daily.     Allergies:   Patient has no known allergies.   Social History   Socioeconomic History  . Marital status: Widowed    Spouse name: Not on file  . Number of children: 2  . Years of education: 7TH  . Highest education level: Not on file  Occupational History  . Not on file  Tobacco Use  . Smoking status: Former Smoker    Years: 10.00    Types: Cigarettes    Quit date: 06/18/1948    Years since quitting: 72.2  . Smokeless tobacco: Never Used  Vaping Use  . Vaping Use: Never used  Substance and Sexual Activity  . Alcohol use:  No  . Drug use: No  . Sexual activity: Not on file  Other Topics Concern  . Not on file  Social History Narrative   03/14/20 Patient lives at home with family.   Caffeine Use: rarely   Social Determinants of Radio broadcast assistant Strain: Not on file  Food Insecurity: Not on file  Transportation Needs: Not on file  Physical Activity: Not on file  Stress: Not on file  Social Connections: Not on file     Family History: The patient's family history includes Bladder Cancer in an other family member; CAD in his father; Diabetes in his father; Leukemia in an other family member.  ROS:   Please see the history of present illness.    ROS  All other systems reviewed and negative.   EKGs/Labs/Other Studies Reviewed:    The following studies were reviewed today: Outside labs  EKG:  EKG is  ordered today and demonstrates atrial fibrillation with CVR and occasional ventricular paced beats  Recent Labs: 08/27/2019: ALT 9; BUN 20; Creatinine, Ser 1.10; Hemoglobin 11.4; Platelets 290; Potassium 4.1; Sodium 146   Recent Lipid Panel    Component Value Date/Time   CHOL 107 08/27/2019 1550   TRIG 106 08/27/2019 1550   HDL 42 08/27/2019 1550   CHOLHDL 2.5 08/27/2019 1550   CHOLHDL 3.8 03/16/2016 0829   VLDL 27 03/16/2016 0829   LDLCALC 45 08/27/2019 1550    Physical Exam:    VS:  BP 108/60   Pulse 73   Ht 5\' 1"  (1.549 m)   Wt 150 lb 12.8 oz (68.4 kg)   SpO2 99%   BMI 28.49 kg/m     Wt Readings from Last 3 Encounters:  08/16/20 150 lb 12.8 oz (68.4 kg)  08/27/19 152 lb (68.9 kg)  07/15/19 154 lb (69.9 kg)     GEN: Well nourished, well developed in no acute distress HEENT: Normal NECK: No JVD; No carotid bruits LYMPHATICS: No lymphadenopathy CARDIAC:irregulalry irregular, no murmurs, rubs, gallops RESPIRATORY:  Clear to auscultation without rales, wheezing or rhonchi  ABDOMEN: Soft, non-tender, non-distended MUSCULOSKELETAL:  No edema; No deformity  SKIN: Warm  and dry NEUROLOGIC:  Alert and oriented x 3 PSYCHIATRIC:  Normal affect    ASSESSMENT:    1. Chronic atrial fibrillation ([place])   2. Pure hypercholesterolemia   3. Primary hypertension   4. Chronic diastolic CHF (congestive heart failure) ([place])   5. Tachycardia-bradycardia ([place])   6. Atherosclerosis of native coronary artery of native heart without angina pectoris    PLAN:    In order of problems listed above:  1.  Permanent atrial fibrillation -his HR is  well controlled in afib -he denies any palpitations -he has not had any bleeding problems on DOAC -continue Cardizem CD 120mg  daily for HR control and Apixaban 5mg  BID -check CMET and CBC  2.  HLD -LDL goal < 70 -check FLP -continue Pravastatin 40mg  daily and Zetia 10mg  daily  3. HTN -BP controlled -continue Cardizem CD 120mg  daily, Losartan 50mg  daily -check BMET  4.  Chronic diastolic CHF -LE edema resolved after eating potato chips -he denies any SOB -weight is stable -continue Lasix 80mg  BID -check BMET  5.  Tachy-Brady Syndrome -s/p PPM -followed in device clinic  6.  ASCAD -s/p PCI of the RCA remotely -denies any anginal sx -no ASA due to DOAC -continue statin   Medication Adjustments/Labs and Tests Ordered: Current medicines are reviewed at length with the patient today.  Concerns regarding medicines are outlined above.  Orders Placed This Encounter  Procedures  . EKG 12-Lead   No orders of the defined types were placed in this encounter.   Signed, Fransico Him, MD  08/16/2020 3:30 PM    [place]

## 2020-08-16 NOTE — Patient Instructions (Signed)
Medication Instructions:  Your physician recommends that you continue on your current medications as directed. Please refer to the Current Medication list given to you today.  *If you need a refill on your cardiac medications before your next appointment, please call your pharmacy*   Lab Work: Fasting lipids, CMET, and CBC  If you have labs (blood work) drawn today and your tests are completely normal, you will receive your results only by: Marland Kitchen MyChart Message (if you have MyChart) OR . A paper copy in the mail If you have any lab test that is abnormal or we need to change your treatment, we will call you to review the results.  Follow-Up: At [facility], you and your health needs are our priority.  As part of our continuing mission to provide you with exceptional heart care, we have created designated Provider Care Teams.  These Care Teams include your primary Cardiologist (physician) and Advanced Practice Providers (APPs -  Physician Assistants and Nurse Practitioners) who all work together to provide you with the care you need, when you need it.  We recommend signing up for the patient portal called "MyChart".  Sign up information is provided on this After Visit Summary.  MyChart is used to connect with patients for Virtual Visits (Telemedicine).  Patients are able to view lab/test results, encounter notes, upcoming appointments, etc.  Non-urgent messages can be sent to your provider as well.   To learn more about what you can do with MyChart, go to [email or web address].    Your next appointment:   6 month(s)  The format for your next appointment:   In Person  Provider:   You may see Fransico Him, MD or one of the following Advanced Practice Providers on your designated Care Team:    Melina Copa, PA-C  Ermalinda Barrios, PA-C

## 2020-08-16 NOTE — Addendum Note (Signed)
Addended by: Antonieta Iba on: 08/16/2020 03:38 PM   Modules accepted: Orders

## 2020-08-22 DIAGNOSIS — M1990 Unspecified osteoarthritis, unspecified site: Secondary | ICD-10-CM | POA: Diagnosis not present

## 2020-08-22 DIAGNOSIS — I2510 Atherosclerotic heart disease of native coronary artery without angina pectoris: Secondary | ICD-10-CM | POA: Diagnosis not present

## 2020-08-22 DIAGNOSIS — E039 Hypothyroidism, unspecified: Secondary | ICD-10-CM | POA: Diagnosis not present

## 2020-08-22 DIAGNOSIS — I509 Heart failure, unspecified: Secondary | ICD-10-CM | POA: Diagnosis not present

## 2020-09-12 ENCOUNTER — Other Ambulatory Visit: Payer: Self-pay

## 2020-09-12 ENCOUNTER — Other Ambulatory Visit: Payer: Medicare Other | Admitting: *Deleted

## 2020-09-12 DIAGNOSIS — I10 Essential (primary) hypertension: Secondary | ICD-10-CM | POA: Diagnosis not present

## 2020-09-12 DIAGNOSIS — E7800 Pure hypercholesterolemia, unspecified: Secondary | ICD-10-CM

## 2020-09-12 LAB — CBC
Hematocrit: 35.5 % — ABNORMAL LOW (ref 37.5–51.0)
Hemoglobin: 11.8 g/dL — ABNORMAL LOW (ref 13.0–17.7)
MCH: 31.5 pg (ref 26.6–33.0)
MCHC: 33.2 g/dL (ref 31.5–35.7)
MCV: 95 fL (ref 79–97)
Platelets: 245 10*3/uL (ref 150–450)
RBC: 3.75 x10E6/uL — ABNORMAL LOW (ref 4.14–5.80)
RDW: 13.1 % (ref 11.6–15.4)
WBC: 6.7 10*3/uL (ref 3.4–10.8)

## 2020-09-12 LAB — COMPREHENSIVE METABOLIC PANEL
ALT: 5 IU/L (ref 0–44)
AST: 16 IU/L (ref 0–40)
Albumin/Globulin Ratio: 1.6 (ref 1.2–2.2)
Albumin: 3.8 g/dL (ref 3.5–4.6)
Alkaline Phosphatase: 123 IU/L — ABNORMAL HIGH (ref 44–121)
BUN/Creatinine Ratio: 18 (ref 10–24)
BUN: 21 mg/dL (ref 10–36)
Bilirubin Total: 0.7 mg/dL (ref 0.0–1.2)
CO2: 23 mmol/L (ref 20–29)
Calcium: 9 mg/dL (ref 8.6–10.2)
Chloride: 104 mmol/L (ref 96–106)
Creatinine, Ser: 1.16 mg/dL (ref 0.76–1.27)
Globulin, Total: 2.4 g/dL (ref 1.5–4.5)
Glucose: 86 mg/dL (ref 65–99)
Potassium: 3.7 mmol/L (ref 3.5–5.2)
Sodium: 142 mmol/L (ref 134–144)
Total Protein: 6.2 g/dL (ref 6.0–8.5)
eGFR: 58 mL/min/{1.73_m2} — ABNORMAL LOW (ref >59)

## 2020-09-12 LAB — LIPID PANEL
Chol/HDL Ratio: 2.6 ratio (ref 0.0–5.0)
Cholesterol, Total: 110 mg/dL (ref 100–199)
HDL: 43 mg/dL (ref >39)
LDL Chol Calc (NIH): 52 mg/dL (ref 0–99)
Triglycerides: 74 mg/dL (ref 0–149)
VLDL Cholesterol Cal: 15 mg/dL (ref 5–40)

## 2020-09-16 ENCOUNTER — Other Ambulatory Visit: Payer: Self-pay | Admitting: Cardiovascular Disease

## 2020-09-16 ENCOUNTER — Other Ambulatory Visit: Payer: Self-pay | Admitting: Cardiology

## 2020-10-03 DIAGNOSIS — E039 Hypothyroidism, unspecified: Secondary | ICD-10-CM | POA: Diagnosis not present

## 2020-10-03 DIAGNOSIS — E782 Mixed hyperlipidemia: Secondary | ICD-10-CM | POA: Diagnosis not present

## 2020-10-03 DIAGNOSIS — I509 Heart failure, unspecified: Secondary | ICD-10-CM | POA: Diagnosis not present

## 2020-10-03 DIAGNOSIS — I4891 Unspecified atrial fibrillation: Secondary | ICD-10-CM | POA: Diagnosis not present

## 2020-10-17 ENCOUNTER — Other Ambulatory Visit: Payer: Self-pay | Admitting: Cardiology

## 2020-11-08 DIAGNOSIS — I4891 Unspecified atrial fibrillation: Secondary | ICD-10-CM | POA: Diagnosis not present

## 2020-11-08 DIAGNOSIS — I2510 Atherosclerotic heart disease of native coronary artery without angina pectoris: Secondary | ICD-10-CM | POA: Diagnosis not present

## 2020-11-08 DIAGNOSIS — I509 Heart failure, unspecified: Secondary | ICD-10-CM | POA: Diagnosis not present

## 2020-11-08 DIAGNOSIS — E039 Hypothyroidism, unspecified: Secondary | ICD-10-CM | POA: Diagnosis not present

## 2020-12-08 DIAGNOSIS — M1990 Unspecified osteoarthritis, unspecified site: Secondary | ICD-10-CM | POA: Diagnosis not present

## 2020-12-08 DIAGNOSIS — E785 Hyperlipidemia, unspecified: Secondary | ICD-10-CM | POA: Diagnosis not present

## 2020-12-08 DIAGNOSIS — Z0001 Encounter for general adult medical examination with abnormal findings: Secondary | ICD-10-CM | POA: Diagnosis not present

## 2020-12-08 DIAGNOSIS — I2510 Atherosclerotic heart disease of native coronary artery without angina pectoris: Secondary | ICD-10-CM | POA: Diagnosis not present

## 2020-12-08 DIAGNOSIS — G20 Parkinson's disease: Secondary | ICD-10-CM | POA: Diagnosis not present

## 2020-12-08 DIAGNOSIS — E039 Hypothyroidism, unspecified: Secondary | ICD-10-CM | POA: Diagnosis not present

## 2020-12-21 ENCOUNTER — Encounter: Payer: Medicare Other | Admitting: Student

## 2020-12-27 DIAGNOSIS — E785 Hyperlipidemia, unspecified: Secondary | ICD-10-CM | POA: Diagnosis not present

## 2020-12-27 DIAGNOSIS — J449 Chronic obstructive pulmonary disease, unspecified: Secondary | ICD-10-CM | POA: Diagnosis not present

## 2020-12-27 DIAGNOSIS — I2510 Atherosclerotic heart disease of native coronary artery without angina pectoris: Secondary | ICD-10-CM | POA: Diagnosis not present

## 2020-12-27 DIAGNOSIS — E039 Hypothyroidism, unspecified: Secondary | ICD-10-CM | POA: Diagnosis not present

## 2021-01-16 NOTE — Progress Notes (Signed)
Electrophysiology Office Note Date: 01/17/2021  ID:  Yousuf, Whack 03/14/1922, MRN [id number]  PCP: Josetta Huddle, MD Primary Cardiologist: Fransico Him, MD Electrophysiologist: Thompson Grayer, MD   CC: Pacemaker follow-up  ARISTIDIS ADLER is a 87 y.o. male seen today for Thompson Grayer, MD for routine electrophysiology followup.  Since last being seen in our clinic the patient reports doing well overall. He is WC bound per his daughter.  he denies chest pain, palpitations, dyspnea, PND, orthopnea, nausea, vomiting, dizziness, syncope, edema, weight gain, or early satiety. No issues with bleeding on Eliquis.   Device History: BSX dual chamber PPM implanted 2003 for tachy-brady syndrome; gen change 2010 Programmed VVIR  Past Medical History:  Diagnosis Date   Allergic rhinitis    Cancer ([place]) 09/2015   skin ,basal cell   Chronic knee pain    Constipation    COPD, mild (HCC)    Coronary artery disease    s/p PCI of the RCA   Diverticulosis    Dyslipidemia    Gout    Hypertension    Long term (current) use of anticoagulants    Onychomycosis    Osteoarthrosis, unspecified whether generalized or localized, other specified sites    Has gait disorder secondary to DJD of lower extremities   Parkinson's disease ([place]) 03/2012   Dr Vilinda Blanks started   Permanent atrial fibrillation ([place])    Chronic. ECHO 06/04/11 LVEF estimated by 2D at 52.1%   Pure hypercholesterolemia    Seborrheic keratosis, inflamed    Skin lesion    Plan to remove left posterior ear lesion with electrocautery   Tachy-brady syndrome [facility])    s/p permanent pacemaker placement   Unspecified hypothyroidism    Vertigo    Vitamin D deficiency    Past Surgical History:  Procedure Laterality Date   FRACTURE SURGERY Right    clavicle   INGUINAL HERNIA REPAIR Bilateral    LAPAROSCOPIC PARTIAL COLECTOMY     Partial resection of sigmoid colon   PACEMAKER PLACEMENT  2003   Tachybradycardia syndrome.  Gen change 07/29/08, Dr. Leonia Reeves   TYMPANOPLASTY Bilateral     Current Outpatient Medications  Medication Sig Dispense Refill   carbidopa-levodopa (SINEMET IR) 25-100 MG tablet Take 2 tablets by mouth 3 (three) times daily. 540 tablet 4   Cholecalciferol (VITAMIN D3) 2000 UNITS TABS Take 2,000 Units by mouth daily.     diltiazem (CARDIZEM CD) 120 MG 24 hr capsule TAKE 1 CAPSULE(120 MG) BY MOUTH DAILY 90 capsule 3   Docusate Calcium (STOOL SOFTENER PO) Take 100 mg by mouth as directed.     ELIQUIS 5 MG TABS tablet TAKE 1 TABLET(5 MG) BY MOUTH TWICE DAILY 60 tablet 6   ezetimibe (ZETIA) 10 MG tablet TAKE 1 TABLET BY MOUTH DAILY 90 tablet 3   fexofenadine (ALLEGRA) 60 MG tablet Take 60 mg by mouth daily as needed for allergies.      furosemide (LASIX) 80 MG tablet Take 80 mg by mouth 2 (two) times daily.      levothyroxine (SYNTHROID, LEVOTHROID) 75 MCG tablet Take 75 mcg by mouth daily before breakfast.      losartan (COZAAR) 50 MG tablet TAKE 1 TABLET(50 MG) BY MOUTH DAILY 30 tablet 11   Multiple Vitamin (MULTIVITAMIN) capsule Take 1 capsule by mouth daily.     Multiple Vitamins-Minerals (PRESERVISION AREDS) CAPS Take 1 capsule by mouth daily.      potassium chloride SA (K-DUR,KLOR-CON) 20 MEQ tablet Take 30 mEq by  mouth daily.     pravastatin (PRAVACHOL) 40 MG tablet TAKE 1 TABLET BY MOUTH EVERY DAY 90 tablet 3   vitamin B-12 (CYANOCOBALAMIN) 1000 MCG tablet Take 1,000 mcg by mouth daily.     No current facility-administered medications for this visit.    Allergies:   Patient has no known allergies.   Social History: Social History   Socioeconomic History   Marital status: Widowed    Spouse name: Not on file   Number of children: 2   Years of education: 7TH   Highest education level: Not on file  Occupational History   Not on file  Tobacco Use   Smoking status: Former    Years: 10.00    Types: Cigarettes    Quit date: 06/18/1948    Years since quitting: 72.6   Smokeless  tobacco: Never  Vaping Use   Vaping Use: Never used  Substance and Sexual Activity   Alcohol use: No   Drug use: No   Sexual activity: Not on file  Other Topics Concern   Not on file  Social History Narrative   03/14/20 Patient lives at home with family.   Caffeine Use: rarely   Social Determinants of Radio broadcast assistant Strain: Not on file  Food Insecurity: Not on file  Transportation Needs: Not on file  Physical Activity: Not on file  Stress: Not on file  Social Connections: Not on file  Intimate Partner Violence: Not on file    Family History: Family History  Problem Relation Age of Onset   CAD Father    Diabetes Father    Bladder Cancer Other    Leukemia Other      Review of Systems: All other systems reviewed and are otherwise negative except as noted above.  Physical Exam: Vitals:   01/17/21 0825  BP: (!) 94/50  Pulse: 66  SpO2: 97%  Weight: 153 lb (69.4 kg)  Height: '5\' 1"'$  (1.549 m)     GEN- The patient is well appearing, alert and oriented x 3 today.   HEENT: normocephalic, atraumatic; sclera clear, conjunctiva pink; hearing intact; oropharynx clear; neck supple  Lungs- Clear to ausculation bilaterally, normal work of breathing.  No wheezes, rales, rhonchi Heart- Regular rate and rhythm, no murmurs, rubs or gallops  GI- soft, non-tender, non-distended, bowel sounds present  Extremities- no clubbing or cyanosis. No edema MS- no significant deformity or atrophy Skin- warm and dry, no rash or lesion; PPM pocket well healed Psych- euthymic mood, full affect Neuro- strength and sensation are intact  PPM Interrogation- reviewed in detail today,  See PACEART report  EKG:  EKG is not ordered today.  Recent Labs: 09/12/2020: ALT 5; BUN 21; Creatinine, Ser 1.16; Hemoglobin 11.8; Platelets 245; Potassium 3.7; Sodium 142   Wt Readings from Last 3 Encounters:  01/17/21 153 lb (69.4 kg)  08/16/20 150 lb 12.8 oz (68.4 kg)  08/27/19 152 lb (68.9 kg)      Other studies Reviewed: Additional studies/ records that were reviewed today include: Previous EP office notes, Previous remote checks, Most recent labwork.   Assessment and Plan:  1. Tachy-Brady syndrome s/p [employer] PPM  Normal PPM function Had episode of RV lead noise again today, 01/17/2021 (known history). Not reproducible. Stable lead measurements.  See Claudia Desanctis Art report No changes today He is NOT dependent today.  2. Permanent Afib Denies bleeding on Eliquis for CHA2DS2-VASc of at least 4.    3. HTN Stable and asymptomatic on current regimen.  4. CAD Denies anginal symptoms.  On statin, no ASA given Eliquis  Current medicines are reviewed at length with the patient today.   The patient does not have concerns regarding his medicines.  The following changes were made today:  none  Labs/ tests ordered today include:  No orders of the defined types were placed in this encounter.  Disposition:   Follow up EP APP in 3 months given lack of remotes and 12 months until ERI. He is NOT dependent, so may not end up needing a gen change. Especially given functional status.  Will plan conversation with MD prior to ERI. Likely in 6 months.    It's possible we could turn LRL down to 40 or 50 to do a "trial" run of this.   Jacalyn Lefevre, PA-C  01/17/2021 8:46 AM  [facility] [address] [place] [address] [phone] (office) [phone] (fax)

## 2021-01-17 ENCOUNTER — Other Ambulatory Visit: Payer: Self-pay

## 2021-01-17 ENCOUNTER — Encounter (INDEPENDENT_AMBULATORY_CARE_PROVIDER_SITE_OTHER): Payer: Self-pay

## 2021-01-17 ENCOUNTER — Encounter: Payer: Self-pay | Admitting: Student

## 2021-01-17 ENCOUNTER — Ambulatory Visit: Payer: Medicare Other | Admitting: Student

## 2021-01-17 VITALS — BP 94/50 | HR 66 | Ht 61.0 in | Wt 153.0 lb

## 2021-01-17 DIAGNOSIS — I482 Chronic atrial fibrillation, unspecified: Secondary | ICD-10-CM | POA: Diagnosis not present

## 2021-01-17 DIAGNOSIS — I10 Essential (primary) hypertension: Secondary | ICD-10-CM

## 2021-01-17 DIAGNOSIS — I5032 Chronic diastolic (congestive) heart failure: Secondary | ICD-10-CM | POA: Diagnosis not present

## 2021-01-17 DIAGNOSIS — I495 Sick sinus syndrome: Secondary | ICD-10-CM

## 2021-01-17 LAB — CUP PACEART INCLINIC DEVICE CHECK
Brady Statistic RA Percent Paced: 0 %
Brady Statistic RV Percent Paced: 45 %
Date Time Interrogation Session: 20220802084616
Implantable Lead Implant Date: 20100211
Implantable Lead Implant Date: 20100211
Implantable Lead Location: 753859
Implantable Lead Location: 753860
Implantable Lead Model: 4456
Implantable Lead Model: 4470
Implantable Lead Serial Number: 313924
Implantable Lead Serial Number: 330954
Implantable Pulse Generator Implant Date: 20100211
Lead Channel Impedance Value: 340 Ohm
Lead Channel Pacing Threshold Amplitude: 0.6 V
Lead Channel Pacing Threshold Pulse Width: 0.4 ms
Lead Channel Sensing Intrinsic Amplitude: 5.7 mV
Lead Channel Setting Pacing Amplitude: 2.4 V
Lead Channel Setting Pacing Pulse Width: 0.4 ms
Lead Channel Setting Sensing Sensitivity: 2 mV
Pulse Gen Serial Number: 594044

## 2021-01-17 NOTE — Patient Instructions (Signed)
Medication Instructions:  Your physician recommends that you continue on your current medications as directed. Please refer to the Current Medication list given to you today.  *If you need a refill on your cardiac medications before your next appointment, please call your pharmacy*   Lab Work: None If you have labs (blood work) drawn today and your tests are completely normal, you will receive your results only by: [place] (if you have MyChart) OR A paper copy in the mail If you have any lab test that is abnormal or we need to change your treatment, we will call you to review the results.  Follow-Up: At [facility], you and your health needs are our priority.  As part of our continuing mission to provide you with exceptional heart care, we have created designated Provider Care Teams.  These Care Teams include your primary Cardiologist (physician) and Advanced Practice Providers (APPs -  Physician Assistants and Nurse Practitioners) who all work together to provide you with the care you need, when you need it.  We recommend signing up for the patient portal called "MyChart".  Sign up information is provided on this After Visit Summary.  MyChart is used to connect with patients for Virtual Visits (Telemedicine).  Patients are able to view lab/test results, encounter notes, upcoming appointments, etc.  Non-urgent messages can be sent to your provider as well.   To learn more about what you can do with MyChart, go to [email or web address].    Your next appointment:   As scheduled

## 2021-01-21 ENCOUNTER — Other Ambulatory Visit: Payer: Self-pay | Admitting: Cardiology

## 2021-01-23 NOTE — Telephone Encounter (Signed)
Eliquis '5mg'$  refill request received. Patient is 87 years old, weight-69.4kg, Crea-1.16 on 09/12/2020, Diagnosis-Afib, and last seen by Oda Kilts on 01/17/2021. Dose is appropriate based on dosing criteria. Will send in refill to requested pharmacy.

## 2021-02-07 DIAGNOSIS — J449 Chronic obstructive pulmonary disease, unspecified: Secondary | ICD-10-CM | POA: Diagnosis not present

## 2021-02-07 DIAGNOSIS — E785 Hyperlipidemia, unspecified: Secondary | ICD-10-CM | POA: Diagnosis not present

## 2021-02-07 DIAGNOSIS — I4891 Unspecified atrial fibrillation: Secondary | ICD-10-CM | POA: Diagnosis not present

## 2021-02-07 DIAGNOSIS — E039 Hypothyroidism, unspecified: Secondary | ICD-10-CM | POA: Diagnosis not present

## 2021-03-15 ENCOUNTER — Ambulatory Visit: Payer: Medicare Other | Admitting: Family Medicine

## 2021-03-17 DIAGNOSIS — E785 Hyperlipidemia, unspecified: Secondary | ICD-10-CM | POA: Diagnosis not present

## 2021-03-17 DIAGNOSIS — I2510 Atherosclerotic heart disease of native coronary artery without angina pectoris: Secondary | ICD-10-CM | POA: Diagnosis not present

## 2021-03-17 DIAGNOSIS — J449 Chronic obstructive pulmonary disease, unspecified: Secondary | ICD-10-CM | POA: Diagnosis not present

## 2021-03-17 DIAGNOSIS — E039 Hypothyroidism, unspecified: Secondary | ICD-10-CM | POA: Diagnosis not present

## 2021-04-03 ENCOUNTER — Other Ambulatory Visit: Payer: Self-pay | Admitting: Diagnostic Neuroimaging

## 2021-04-12 ENCOUNTER — Telehealth: Payer: Self-pay | Admitting: Diagnostic Neuroimaging

## 2021-04-12 MED ORDER — CARBIDOPA-LEVODOPA 25-100 MG PO TABS: 2.0000 | ORAL_TABLET | Freq: Three times a day (TID) | ORAL | 1 refills | Status: DC

## 2021-04-12 NOTE — Telephone Encounter (Addendum)
Contacted daughter back, informed her that we have to either get pt on mychart or FU with PCP. We are unable to refill and treat patient without seeing him and its been over a yr. Daughter agreed to make appt, can refill meds until appt is completed. Need to complete FU for further refills   Please schedule pt with Dr Mamie Nick

## 2021-04-12 NOTE — Telephone Encounter (Signed)
Pt's daughter [name] on [place] called needing to discuss with the RN what can be done for the pt to be able to get his medication. She states that it is hard to get him in the office due to his age and they do not use mychart. Please advise.

## 2021-04-12 NOTE — Telephone Encounter (Signed)
Refills sent, he has follow up in Dec.

## 2021-04-12 NOTE — Telephone Encounter (Signed)
Pt's daughter, [name] request refill for carbidopa-levodopa (SINEMET IR) 25-100 MG tablet at [facility][id number]

## 2021-04-24 ENCOUNTER — Encounter: Payer: Medicare Other | Admitting: Student

## 2021-04-25 ENCOUNTER — Encounter: Payer: Medicare Other | Admitting: Student

## 2021-06-06 ENCOUNTER — Encounter: Payer: Self-pay | Admitting: Diagnostic Neuroimaging

## 2021-06-06 ENCOUNTER — Ambulatory Visit: Payer: Medicare Other | Admitting: Diagnostic Neuroimaging

## 2021-06-06 ENCOUNTER — Other Ambulatory Visit: Payer: Self-pay

## 2021-06-06 VITALS — BP 87/51 | HR 68

## 2021-06-06 DIAGNOSIS — G20 Parkinson's disease: Secondary | ICD-10-CM | POA: Diagnosis not present

## 2021-06-06 MED ORDER — CARBIDOPA-LEVODOPA 25-100 MG PO TABS: 2.0000 | ORAL_TABLET | Freq: Three times a day (TID) | ORAL | 4 refills | Status: DC

## 2021-06-06 NOTE — Progress Notes (Signed)
Chief Complaint  Patient presents with   Parkinson's disease    Rm 7, one year FU  dgtr- Dawn  "no new concerns"      History of Present Illness:  UPDATE (06/06/21, VRP): Since last visit, doing well. Symptoms are stable. No alleviating or aggravating factors. Tolerating meds. BP stable but low.   UPDATE (03/14/20, VRP): Since last visit, doing well. Symptoms are stable. Severity is mild. No alleviating or aggravating factors. Tolerating carb/levo.  UPDATE (09/24/17, VRP): Since last visit, doing well. Tolerating carb/levo. No alleviating or aggravating factors. No falls. No new issues.    UPDATE 09/10/16: Since last visit, doing well. No major changes. Tolerating meds. Mainly in wheelchair. No falls. Daughter helping at home. Continues with speech diff, dizziness, gait diff. No other alleviating or aggravating factors.    UPDATE 09/12/15: Since last visit, doing about the same to slightly progressing with PD. No falls. Mainly in wheelchair. Uses cane occ to transfer, but needs help. Daughter helps with ADLs.    UPDATE 03/15/15: Since last visit, patient doing well. No falls. Tolerating meds. Some swallow issues with larger pills. Doing ok with foods.   UPDATE 09/09/14: Since last visit, doing well. Tremor stable. Notes some internal shaking sensation (abdomen) without anxiety, SOB or CP. Tolerating carb/levo 25/100 2 tabs TID.    UPDATE 03/11/14 (VRP): Since last visit, doing better with tremor control on higher carb/levo (2 tabs PO TID). Able to transfer from wheelchair to bed and commode on his own. Walks short distance with walker, but very unsteady.    UPDATE 07/17/13 (LL):  Since last visit, he increased Sinemet to 1.5 tablets and then to 2 tablets TID.   Since being at 2 tablets TID, he states that sometimes it is harder for him to speak; get his words out.  Tremors are still bothersome, and and there is now akathisia with his tongue and jaw. The tremor makes it difficult for him to get  to sleep, but when he does sleep, he sleeps well.  Denies dreaming or hallucinations.    UPDATE 01/14/13 (LL): Patient comes in for follow up for Parkinson's Disease. Patient reports that his tremor is worse in both upper extremities, it makes it difficult for him to get to sleep at night. It does not hinder his ability to feed himself. No problem with taste, smell or appetite. Denies constipation, has some urinary urgency. Takes dose of Carb/Levo at 7; 12 and 4. Feels like medication helped at first but now it's not. No side effects from med. He goes to sleep at 10:30 pm. Denies dreaming or hallucinations.    UPDATE 07/15/12 (VRP): Since last visit, carb/levo has helped his tremor. Now able to fall asleep better because tremor is better controlled. No side effects from medications. Satisfied with the results so far.    Prior HPI (03/26/2012) (VRP): 87 year old right-handed male with history of atrial fibrillation, hypertension, hypothyroidism, hypercholesterolemia, coronary artery disease, here for evaluation of gait difficulty and tremor. Evaluate for possible Parkinson disease. In 2010, patient began to develop intermittent left upper extremity resting tremor. Tremor progressed to more continuous and bilateral tremor. He does not have significant difficulty when he is doing actions. In addition by 2011 he began to use a cane for walking imbalance. In 2012 he was using a walker. In 2013 he's been using a wheelchair most of the time. He is able to take a few steps with assistance to the bathroom. Of note patient's voice has become  more quite over the past 2 years. No constipation, sleep disturbance, anxiety or smell or taste difficulty. His tremors worsened nighttime. No family history of tremor.    Observations/Objective:  Vitals with BMI 06/06/2021 01/17/2021 08/16/2020  Height - 5\' 1"  5\' 1"   Weight (No Data) 153 lbs 150 lbs 13 oz  BMI - 28.76 81.15  Systolic 87 94 726  Diastolic 51 50 60  Pulse 68 66  73     Generalized: In no acute distress, pleasant male in wheelchair  Neck: Supple, no carotid bruits  Cardiac: Irregular rate rhythm, no murmur    Neurological examination  Mentation: Awake and alert, language fluent, comprehension intact.  Cranial nerve II-XII: Pupils were equal round reactive to light extraocular movements were full, visual field were full on confrontational test. facial sensation and strength were normal. hearing was intact to finger rubbing bilaterally. Uvula tongue midline. head turning and shoulder shrug and were normal and symmetric.Tongue protrusion into cheek strength was normal.   MOTOR: NO COGWHEELING. MILD BRADYKINESIA IN BUE and BLE. Normal bulk. BUE 3-4. BLE 3-4. LIMITED IN BILATERAL SHOULDER ROM (LEFT WORSE THAN RIGHT) SENSORY: normal and symmetric to light touch. ABSENT VIB AT TOES AND ANKLES.  COORDINATION: BUE ACTION TREMOR.  REFLEXES: Deep tendon reflexes in the upper and lower extremities are TRACE and symmetric.  GAIT/STATION: IN WHEELCHAIR   Assessment and Plan:  87 y.o. right-handed male with history of atrial fibrillation, hypertension, hypothyroidism, hypercholesteremia, coronary artery disease, here for evaluation of gait difficulty and tremor. He has 4/4 cardinal signs of Parkinson's disease. Stable on carb/levo, but patient has advanced parkinson's disease, gradually progression, and now patient is almost completely wheelchair bound.  Dx:  1. Parkinson disease ([place])      PARKINSON'S DISEASE (stable) - continue carb/levo 2 tabs three times a day  - caution with balance and fall risk  Meds ordered this encounter  Medications   carbidopa-levodopa (SINEMET IR) 25-100 MG tablet    Sig: Take 2 tablets by mouth 3 (three) times daily.    Dispense:  540 tablet    Refill:  4    Follow Up Instructions:  - Return in about 14 months (around 08/07/2022) for OK FOR TELEPHONE OR VIDEO VISIT (DUE TO PATIENT MOBILITY ISSUES), MyChart visit (15  min).       Penni Bombard, MD 20/35/5974, 1:63 AM Certified in Neurology, Neurophysiology and Neuroimaging  [facility] [address] [place], [place] [address] [phone]

## 2021-06-22 DIAGNOSIS — Z23 Encounter for immunization: Secondary | ICD-10-CM | POA: Diagnosis not present

## 2021-06-22 DIAGNOSIS — I5032 Chronic diastolic (congestive) heart failure: Secondary | ICD-10-CM | POA: Diagnosis not present

## 2021-06-22 DIAGNOSIS — E039 Hypothyroidism, unspecified: Secondary | ICD-10-CM | POA: Diagnosis not present

## 2021-06-22 DIAGNOSIS — I2510 Atherosclerotic heart disease of native coronary artery without angina pectoris: Secondary | ICD-10-CM | POA: Diagnosis not present

## 2021-06-22 DIAGNOSIS — I4891 Unspecified atrial fibrillation: Secondary | ICD-10-CM | POA: Diagnosis not present

## 2021-08-04 ENCOUNTER — Encounter: Payer: Self-pay | Admitting: Internal Medicine

## 2021-08-04 ENCOUNTER — Other Ambulatory Visit: Payer: Self-pay

## 2021-08-04 ENCOUNTER — Ambulatory Visit: Payer: Medicare Other | Admitting: Internal Medicine

## 2021-08-04 VITALS — BP 92/74 | HR 79 | Ht 61.0 in | Wt 149.2 lb

## 2021-08-04 DIAGNOSIS — I482 Chronic atrial fibrillation, unspecified: Secondary | ICD-10-CM | POA: Diagnosis not present

## 2021-08-04 DIAGNOSIS — I495 Sick sinus syndrome: Secondary | ICD-10-CM

## 2021-08-04 DIAGNOSIS — I10 Essential (primary) hypertension: Secondary | ICD-10-CM | POA: Diagnosis not present

## 2021-08-04 DIAGNOSIS — Z950 Presence of cardiac pacemaker: Secondary | ICD-10-CM

## 2021-08-04 DIAGNOSIS — I2510 Atherosclerotic heart disease of native coronary artery without angina pectoris: Secondary | ICD-10-CM

## 2021-08-04 NOTE — Progress Notes (Signed)
PCP: Josetta Huddle, MD Primary Cardiologist: Dr Radford Pax Primary EP:  Dr Rayann Heman  Jerry Wood is a 88 y.o. male who presents today for routine electrophysiology followup.  Since last being seen in our clinic, the patient reports doing reasonably well for his age.  He is in a wheelchair.  Does not get out much but is hoping to get back to church this spring.  Today, he denies symptoms of palpitations, chest pain, shortness of breath,  dizziness, presyncope, or syncope.  The patient is otherwise without complaint today.   Past Medical History:  Diagnosis Date   Allergic rhinitis    Cancer ([place]) 09/2015   skin ,basal cell   Chronic knee pain    Constipation    COPD, mild (HCC)    Coronary artery disease    s/p PCI of the RCA   Diverticulosis    Dyslipidemia    Gout    Hypertension    Long term (current) use of anticoagulants    Onychomycosis    Osteoarthrosis, unspecified whether generalized or localized, other specified sites    Has gait disorder secondary to DJD of lower extremities   Parkinson's disease ([place]) 03/2012   Dr Vilinda Blanks started   Permanent atrial fibrillation ([place])    Chronic. ECHO 06/04/11 LVEF estimated by 2D at 52.1%   Pure hypercholesterolemia    Seborrheic keratosis, inflamed    Skin lesion    Plan to remove left posterior ear lesion with electrocautery   Tachy-brady syndrome [facility])    s/p permanent pacemaker placement   Unspecified hypothyroidism    Vertigo    Vitamin D deficiency    Past Surgical History:  Procedure Laterality Date   FRACTURE SURGERY Right    clavicle   INGUINAL HERNIA REPAIR Bilateral    LAPAROSCOPIC PARTIAL COLECTOMY     Partial resection of sigmoid colon   PACEMAKER PLACEMENT  2003   Tachybradycardia syndrome. Gen change 07/29/08, Dr. Leonia Reeves   TYMPANOPLASTY Bilateral     ROS- all systems are reviewed and negative except as per HPI above  Current Outpatient Medications  Medication Sig Dispense Refill    carbidopa-levodopa (SINEMET IR) 25-100 MG tablet Take 2 tablets by mouth 3 (three) times daily. 540 tablet 4   Cholecalciferol (VITAMIN D3) 2000 UNITS TABS Take 2,000 Units by mouth daily.     diltiazem (CARDIZEM CD) 120 MG 24 hr capsule TAKE 1 CAPSULE(120 MG) BY MOUTH DAILY 90 capsule 3   Docusate Calcium (STOOL SOFTENER PO) Take 100 mg by mouth as directed.     ELIQUIS 5 MG TABS tablet TAKE 1 TABLET(5 MG) BY MOUTH TWICE DAILY 60 tablet 6   ezetimibe (ZETIA) 10 MG tablet TAKE 1 TABLET BY MOUTH DAILY 90 tablet 3   fexofenadine (ALLEGRA) 60 MG tablet Take 60 mg by mouth daily as needed for allergies.      furosemide (LASIX) 80 MG tablet Take 80 mg by mouth 2 (two) times daily.      levothyroxine (SYNTHROID, LEVOTHROID) 75 MCG tablet Take 75 mcg by mouth daily before breakfast.      losartan (COZAAR) 50 MG tablet TAKE 1 TABLET(50 MG) BY MOUTH DAILY 30 tablet 11   Multiple Vitamin (MULTIVITAMIN) capsule Take 1 capsule by mouth daily.     Multiple Vitamins-Minerals (PRESERVISION AREDS) CAPS Take 1 capsule by mouth daily.      potassium chloride SA (K-DUR,KLOR-CON) 20 MEQ tablet Take 30 mEq by mouth daily.     pravastatin (PRAVACHOL) 40 MG  tablet TAKE 1 TABLET BY MOUTH EVERY DAY 90 tablet 3   vitamin B-12 (CYANOCOBALAMIN) 1000 MCG tablet Take 1,000 mcg by mouth daily.     No current facility-administered medications for this visit.    Physical Exam: Vitals:   08/04/21 1124  BP: 92/74  Pulse: 79  SpO2: 94%  Weight: 149 lb 3.2 oz (67.7 kg)  Height: 5\' 1"  (3.614 m)    GEN- The patient is well appearing, alert and oriented x 3 today.   Head- normocephalic, atraumatic Eyes-  Sclera clear, conjunctiva pink Ears- hearing intact Oropharynx- clear Lungs- Clear to ausculation bilaterally, normal work of breathing Chest- pacemaker pocket is well healed Heart- iRRR GI- soft, NT, ND, + BS Extremities- no clubbing, cyanosis, + dependant edema  Pacemaker interrogation- reviewed in detail today,   See PACEART report  ekg tracing ordered today is personally reviewed and shows afib, demand V pacing  Assessment and Plan:  1. Symptomatic second degree heart block Normal pacemaker function See Pace Art report No changes today he is not device dependant today Chronic lead noise noted V paces about 50% Approaching ERI.  Not remote compatible. Risks, benefits, and alternatives to PPM pulse generator replacement were discussed in detail today.  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 Return for device clinic check in 3-4 months.  Once ERI, ok to schedule generator change with me.  Hold coumadin 48 hours prior to the procedure.  2. Permanent afib On coumadin for chads2vasc score of 4  3. HTN Stable No change required today  4. CAD No ischemic symptoms No changes  Risks, benefits and potential toxicities for medications prescribed and/or refilled reviewed with patient today.   Thompson Grayer MD, [facility] 08/04/2021 11:31 AM '

## 2021-08-04 NOTE — Progress Notes (Signed)
ekg 

## 2021-08-04 NOTE — Patient Instructions (Signed)
Medication Instructions:  Your physician recommends that you continue on your current medications as directed. Please refer to the Current Medication list given to you today.  Labwork: None ordered.  Testing/Procedures: None ordered.  Follow-Up:  Oct 25, 2021 at 10:00 am with the device clinic for a battery check.  Any Other Special Instructions Will Be Listed Below (If Applicable).  If you need a refill on your cardiac medications before your next appointment, please call your pharmacy.

## 2021-08-26 ENCOUNTER — Other Ambulatory Visit: Payer: Self-pay | Admitting: Cardiology

## 2021-08-28 NOTE — Telephone Encounter (Signed)
Pt last saw Dr Rayann Heman 08/04/21, last labs 06/22/21 Creat 1.13 at [facility] per [place], age 88, weight 67.7kg, based on specified criteria pt is on appropriate dosage of Eliquis '5mg'$  BID for afib.  Will refill rx.  ?

## 2021-09-11 ENCOUNTER — Other Ambulatory Visit: Payer: Self-pay | Admitting: Cardiology

## 2021-09-16 ENCOUNTER — Other Ambulatory Visit: Payer: Self-pay | Admitting: Cardiology

## 2021-10-10 ENCOUNTER — Other Ambulatory Visit: Payer: Self-pay | Admitting: Diagnostic Neuroimaging

## 2021-10-17 ENCOUNTER — Other Ambulatory Visit: Payer: Self-pay | Admitting: Cardiology

## 2021-10-21 ENCOUNTER — Other Ambulatory Visit: Payer: Self-pay | Admitting: Cardiology

## 2021-10-23 ENCOUNTER — Other Ambulatory Visit: Payer: Self-pay | Admitting: *Deleted

## 2021-10-23 MED ORDER — EZETIMIBE 10 MG PO TABS: 10.0000 mg | ORAL_TABLET | Freq: Every day | ORAL | 0 refills | Status: DC

## 2021-10-23 MED ORDER — LOSARTAN POTASSIUM 50 MG PO TABS: ORAL_TABLET | ORAL | 0 refills | Status: DC

## 2021-10-25 ENCOUNTER — Ambulatory Visit (INDEPENDENT_AMBULATORY_CARE_PROVIDER_SITE_OTHER): Payer: Medicare Other

## 2021-10-25 DIAGNOSIS — I495 Sick sinus syndrome: Secondary | ICD-10-CM

## 2021-10-25 LAB — CUP PACEART INCLINIC DEVICE CHECK
Brady Statistic RA Percent Paced: 0 %
Brady Statistic RV Percent Paced: 51 %
Date Time Interrogation Session: 20230510000000
Implantable Lead Implant Date: 20100211
Implantable Lead Implant Date: 20100211
Implantable Lead Location: 753859
Implantable Lead Location: 753860
Implantable Lead Model: 4456
Implantable Lead Model: 4470
Implantable Lead Serial Number: 313924
Implantable Lead Serial Number: 330954
Implantable Pulse Generator Implant Date: 20100211
Lead Channel Impedance Value: 340 Ohm
Lead Channel Pacing Threshold Amplitude: 0.8 V
Lead Channel Pacing Threshold Pulse Width: 0.4 ms
Lead Channel Sensing Intrinsic Amplitude: 6.2 mV
Lead Channel Setting Pacing Amplitude: 2.4 V
Lead Channel Setting Pacing Pulse Width: 0.4 ms
Lead Channel Setting Sensing Sensitivity: 2 mV
Pulse Gen Serial Number: 594044

## 2021-10-25 NOTE — Progress Notes (Signed)
Pacemaker check in clinic. Normal device function. Thresholds, sensing, impedances consistent with previous measurements. Device programmed to maximize longevity. No high ventricular rates noted. Device programmed at appropriate safety margins. Histogram distribution appropriate for patient activity level. Device programmed to optimize intrinsic conduction. Estimated longevity <0.5 years. Patient unable to participate in remote monitoring. Needs monthly checks as ERN. Patient is scheduled 11/27/21 at 10:00 am. Patient education completed. ?

## 2021-11-09 ENCOUNTER — Other Ambulatory Visit: Payer: Self-pay | Admitting: Cardiology

## 2021-11-17 ENCOUNTER — Telehealth: Payer: Self-pay | Admitting: Cardiology

## 2021-11-21 MED ORDER — LOSARTAN POTASSIUM 50 MG PO TABS: ORAL_TABLET | ORAL | 0 refills | Status: DC

## 2021-11-21 MED ORDER — EZETIMIBE 10 MG PO TABS: 10.0000 mg | ORAL_TABLET | Freq: Every day | ORAL | 0 refills | Status: DC

## 2021-11-21 NOTE — Addendum Note (Signed)
Addended by: Carter Kitten D on: 11/21/2021 10:07 AM   Modules accepted: Orders

## 2021-11-21 NOTE — Telephone Encounter (Signed)
*  STAT* If patient is at the pharmacy, call can be transferred to refill team.   1. Which medications need to be refilled? (please list name of each medication and dose if known) ezetimibe (ZETIA) 10 MG tablet losartan (COZAAR) 50 MG tablet  2. Which pharmacy/location (including street and city if local pharmacy) is medication to be sent to? [place], [place]  3. Do they need a 30 day or 90 day supply? 90 day  Patient has appt in August.

## 2021-11-21 NOTE — Telephone Encounter (Signed)
Pt's medications were sent to pt's pharmacy as requested. Confirmation received.  

## 2021-11-27 ENCOUNTER — Ambulatory Visit (INDEPENDENT_AMBULATORY_CARE_PROVIDER_SITE_OTHER): Payer: Medicare Other

## 2021-11-27 DIAGNOSIS — Z950 Presence of cardiac pacemaker: Secondary | ICD-10-CM

## 2021-11-27 LAB — CUP PACEART INCLINIC DEVICE CHECK
Date Time Interrogation Session: 20230612101737
Implantable Lead Implant Date: 20100211
Implantable Lead Implant Date: 20100211
Implantable Lead Location: 753859
Implantable Lead Location: 753860
Implantable Lead Model: 4456
Implantable Lead Model: 4470
Implantable Lead Serial Number: 313924
Implantable Lead Serial Number: 330954
Implantable Pulse Generator Implant Date: 20100211
Pulse Gen Serial Number: 594044

## 2021-11-27 NOTE — Progress Notes (Signed)
Battery check in-clinic estimated longevity <0.78yr magnet rate 90bpm. ROV 02/06/22 for pacemaker battery check

## 2022-02-01 ENCOUNTER — Telehealth: Payer: Self-pay

## 2022-02-01 NOTE — Telephone Encounter (Signed)
LVM for patients daughter to call back to reschedule 02/06/2022 pacemaker battery check. Reschedule to EP APP slot in case device is at [facility]

## 2022-02-01 NOTE — Telephone Encounter (Signed)
Patient daughter called back and we have rescheduled the appointment with APP

## 2022-02-04 NOTE — Progress Notes (Unsigned)
Cardiology Office Note Date:  02/04/2022  Patient ID:  Jerry, Wood Oct 28, 1921, MRN [id number] PCP:  Josetta Huddle, MD  Cardiologist:  Dr. Radford Pax Electrophysiologist: Dr. Rayann Heman   Chief Complaint:  Over due annual visit  History of Present Illness: Jerry Wood is a 88 y.o. male with history of tachy-brady syndrome w/PPM, HTN, COPD, Parkinson's, permanent Afib, CAD w/PCI to RCA "years ago".   Known to be essentially non-ambulatory, able to aid in transfers.  He saw Dr. Rayann Heman, 08/04/21, doing OK, in a wheelchair, not getting out much, hoping to be able to get back to church.  Discussed nearing ERI and need for upcoming gen change, pt/caregiver agreeable. Device not remote capable  *** battery *** new EP *** Eliquis, bleeding, dose  Device History: [employer] dual chamber PPM implanted 2003 for tachy-brady syndrome; gen change 2010 Programmed VVIR Chronic noise RV lead, not dependent  Past Medical History:  Diagnosis Date   Allergic rhinitis    Cancer ([place]) 09/2015   skin ,basal cell   Chronic knee pain    Constipation    COPD, mild (HCC)    Coronary artery disease    s/p PCI of the RCA   Diverticulosis    Dyslipidemia    Gout    Hypertension    Long term (current) use of anticoagulants    Onychomycosis    Osteoarthrosis, unspecified whether generalized or localized, other specified sites    Has gait disorder secondary to DJD of lower extremities   Parkinson's disease ([place]) 03/2012   Dr Vilinda Blanks started   Permanent atrial fibrillation ([place])    Chronic. ECHO 06/04/11 LVEF estimated by 2D at 52.1%   Pure hypercholesterolemia    Seborrheic keratosis, inflamed    Skin lesion    Plan to remove left posterior ear lesion with electrocautery   Tachy-brady syndrome [facility])    s/p permanent pacemaker placement   Unspecified hypothyroidism    Vertigo    Vitamin D deficiency     Past Surgical History:  Procedure Laterality Date   FRACTURE SURGERY Right     clavicle   INGUINAL HERNIA REPAIR Bilateral    LAPAROSCOPIC PARTIAL COLECTOMY     Partial resection of sigmoid colon   PACEMAKER PLACEMENT  2003   Tachybradycardia syndrome. Gen change 07/29/08, Dr. Leonia Reeves   TYMPANOPLASTY Bilateral     Current Outpatient Medications  Medication Sig Dispense Refill   carbidopa-levodopa (SINEMET IR) 25-100 MG tablet TAKE 2 TABLETS BY MOUTH THREE TIMES DAILY 540 tablet 4   Cholecalciferol (VITAMIN D3) 2000 UNITS TABS Take 2,000 Units by mouth daily.     diltiazem (CARDIZEM CD) 120 MG 24 hr capsule TAKE 1 CAPSULE(120 MG) BY MOUTH DAILY 90 capsule 3   Docusate Calcium (STOOL SOFTENER PO) Take 100 mg by mouth as directed.     ELIQUIS 5 MG TABS tablet TAKE 1 TABLET(5 MG) BY MOUTH TWICE DAILY 60 tablet 6   ezetimibe (ZETIA) 10 MG tablet Take 1 tablet (10 mg total) by mouth daily. 90 tablet 0   fexofenadine (ALLEGRA) 60 MG tablet Take 60 mg by mouth daily as needed for allergies.      furosemide (LASIX) 80 MG tablet Take 80 mg by mouth 2 (two) times daily.      levothyroxine (SYNTHROID, LEVOTHROID) 75 MCG tablet Take 75 mcg by mouth daily before breakfast.      losartan (COZAAR) 50 MG tablet TAKE 1 TABLET(50 MG) BY MOUTH DAILY 90 tablet 0   Multiple Vitamin (  MULTIVITAMIN) capsule Take 1 capsule by mouth daily.     Multiple Vitamins-Minerals (PRESERVISION AREDS) CAPS Take 1 capsule by mouth daily.      potassium chloride SA (K-DUR,KLOR-CON) 20 MEQ tablet Take 30 mEq by mouth daily.     pravastatin (PRAVACHOL) 40 MG tablet TAKE 1 TABLET BY MOUTH EVERY DAY 90 tablet 3   vitamin B-12 (CYANOCOBALAMIN) 1000 MCG tablet Take 1,000 mcg by mouth daily.     No current facility-administered medications for this visit.    Allergies:   Patient has no known allergies.   Social History:  The patient  reports that he quit smoking about 73 years ago. His smoking use included cigarettes. He has been exposed to tobacco smoke. He has never used smokeless tobacco. He reports that  he does not drink alcohol and does not use drugs.   Family History:  The patient's family history includes Bladder Cancer in an other family member; CAD in his father; Diabetes in his father; Leukemia in an other family member.  ROS:  Please see the history of present illness.   All other systems are reviewed and otherwise negative.   PHYSICAL EXAM:  VS:  There were no vitals taken for this visit. BMI: There is no height or weight on file to calculate BMI.  BP checked by myself manually is 102/60, 73/60 Well nourished, well developed, elderly in no acute distress  HEENT: normocephalic, atraumatic  Neck: no JVD, carotid bruits or masses Cardiac:  ***; no significant murmurs, no rubs, or gallops Lungs:  *** CTA b/l, no wheezing, rhonchi or rales  Abd: soft, nontender, obese MS: no deformity, age appropriate atrophy Ext:  *** Trace-1+ LE edema, L slightly > R (chronically)   Skin: warm and dry, no rash Neuro:  No gross deficits appreciated Psych: euthymic mood, full affect  *** PPM site is stable, no tethering or discomfort   EKG:  Done today and reviewed by myself shows:  ***  PPM interrogation done today and reviewed by myself:  ***   02/01/15: TTE Study Conclusions - Left ventricle: The cavity size was normal. Wall thickness was   increased in a pattern of mild LVH. Systolic function was normal.   The estimated ejection fraction was in the range of 55% to 60%.   Wall motion was normal; there were no regional wall motion   abnormalities. - Mitral valve: There was mild regurgitation. - Left atrium: The atrium was severely dilated. - Right atrium: The atrium was mildly dilated. - Pulmonary arteries: Systolic pressure was mildly increased. Impressions: - Normal LV systolic function; severe LAE; mild RAE; mild MR and   TR; mildly elevated pulmonary pressure.  Recent Labs: No results found for requested labs within last 365 days.  No results found for requested labs within  last 365 days.   CrCl cannot be calculated (Patient's most recent lab result is older than the maximum 21 days allowed.).   Wt Readings from Last 3 Encounters:  08/04/21 149 lb 3.2 oz (67.7 kg)  01/17/21 153 lb (69.4 kg)  08/16/20 150 lb 12.8 oz (68.4 kg)     Other studies reviewed: Additional studies/records reviewed today include: summarized above  ASSESSMENT AND PLAN:  1. Tachy-brady syndrome w/PPM      ***       2. Permanent Afib     CHA2DS2Vasc is at least *** 4, on Eliquis *** appropriately dosed     *** rate  3. HTN     ***  4. CAD     *** No symptoms     *** On statin, no ASA given Eliquis     Follows with Dr. Radford Pax    Disposition: ***     Current medicines are reviewed at length with the patient today.  The patient did not have any concerns regarding medicines.  Haywood Lasso, PA-C 02/04/2022 2:35 PM     [place] [address] [phone] (office)  [phone] (fax)

## 2022-02-05 NOTE — Progress Notes (Unsigned)
Cardiology Office Note:    Date:  02/06/2022   ID:  Jerry Wood, DOB [birth year], MRN [id number]  PCP:  Josetta Huddle, MD  Cardiologist:  Fransico Him, MD    Referring MD: Josetta Huddle, MD   Chief Complaint  Patient presents with   Coronary Artery Disease   Hypertension   Hyperlipidemia   Congestive Heart Failure   Atrial Fibrillation    History of Present Illness:    Jerry Wood is a 88 y.o. male with a hx of chronic atrial fibrillation, ASCAD, HTN, dyslipidemia and tachybrady syndrome s/p PPM. He is here today for followup and is doing well.  He denies any chest pain or pressure, SOB, DOE, PND, orthopnea, LE edema, dizziness, palpitations or syncope. He is compliant with his meds and is tolerating meds with no SE.   Past Medical History:  Diagnosis Date   Allergic rhinitis    Cancer ([place]) 09/2015   skin ,basal cell   Chronic knee pain    Constipation    COPD, mild (HCC)    Coronary artery disease    s/p PCI of the RCA   Diverticulosis    Dyslipidemia    Gout    Hypertension    Long term (current) use of anticoagulants    Onychomycosis    Osteoarthrosis, unspecified whether generalized or localized, other specified sites    Has gait disorder secondary to DJD of lower extremities   Parkinson's disease ([place]) 03/2012   Dr Vilinda Blanks started   Permanent atrial fibrillation ([place])    Chronic. ECHO 06/04/11 LVEF estimated by 2D at 52.1%   Pure hypercholesterolemia    Seborrheic keratosis, inflamed    Skin lesion    Plan to remove left posterior ear lesion with electrocautery   Tachy-brady syndrome [facility])    s/p permanent pacemaker placement   Unspecified hypothyroidism    Vertigo    Vitamin D deficiency     Past Surgical History:  Procedure Laterality Date   FRACTURE SURGERY Right    clavicle   INGUINAL HERNIA REPAIR Bilateral    LAPAROSCOPIC PARTIAL COLECTOMY     Partial resection of sigmoid colon   PACEMAKER PLACEMENT  2003   Tachybradycardia  syndrome. Gen change 07/29/08, Dr. Leonia Reeves   TYMPANOPLASTY Bilateral     Current Medications: Current Meds  Medication Sig   carbidopa-levodopa (SINEMET IR) 25-100 MG tablet TAKE 2 TABLETS BY MOUTH THREE TIMES DAILY   Cholecalciferol (VITAMIN D3) 2000 UNITS TABS Take 2,000 Units by mouth daily.   diltiazem (CARDIZEM CD) 120 MG 24 hr capsule TAKE 1 CAPSULE(120 MG) BY MOUTH DAILY   Docusate Calcium (STOOL SOFTENER PO) Take 100 mg by mouth as directed.   ELIQUIS 5 MG TABS tablet TAKE 1 TABLET(5 MG) BY MOUTH TWICE DAILY   ezetimibe (ZETIA) 10 MG tablet Take 1 tablet (10 mg total) by mouth daily.   fexofenadine (ALLEGRA) 60 MG tablet Take 60 mg by mouth daily as needed for allergies.    furosemide (LASIX) 80 MG tablet Take 80 mg by mouth 2 (two) times daily.    levothyroxine (SYNTHROID, LEVOTHROID) 75 MCG tablet Take 75 mcg by mouth daily before breakfast.    losartan (COZAAR) 50 MG tablet TAKE 1 TABLET(50 MG) BY MOUTH DAILY   Multiple Vitamin (MULTIVITAMIN) capsule Take 1 capsule by mouth daily.   Multiple Vitamins-Minerals (PRESERVISION AREDS) CAPS Take 1 capsule by mouth daily.    potassium chloride SA (K-DUR,KLOR-CON) 20 MEQ tablet Take 30 mEq by mouth daily.  pravastatin (PRAVACHOL) 40 MG tablet TAKE 1 TABLET BY MOUTH EVERY DAY   vitamin B-12 (CYANOCOBALAMIN) 1000 MCG tablet Take 1,000 mcg by mouth daily.     Allergies:   Patient has no known allergies.   Social History   Socioeconomic History   Marital status: Widowed    Spouse name: Not on file   Number of children: 2   Years of education: 7TH   Highest education level: Not on file  Occupational History   Not on file  Tobacco Use   Smoking status: Former    Years: 10.00    Types: Cigarettes    Quit date: 06/18/1948    Years since quitting: 73.6    Passive exposure: Past   Smokeless tobacco: Never  Vaping Use   Vaping Use: Never used  Substance and Sexual Activity   Alcohol use: No   Drug use: No   Sexual activity: Not  on file  Other Topics Concern   Not on file  Social History Narrative   03/14/20 Patient lives at home with family.   Caffeine Use: rarely   Social Determinants of Radio broadcast assistant Strain: Not on file  Food Insecurity: Not on file  Transportation Needs: Not on file  Physical Activity: Not on file  Stress: Not on file  Social Connections: Not on file     Family History: The patient's family history includes Bladder Cancer in an other family member; CAD in his father; Diabetes in his father; Leukemia in an other family member.  ROS:   Please see the history of present illness.    ROS  All other systems reviewed and negative.   EKGs/Labs/Other Studies Reviewed:    The following studies were reviewed today: Outside labs  EKG:  EKG is not ordered today   Recent Labs: No results found for requested labs within last 365 days.   Recent Lipid Panel    Component Value Date/Time   CHOL 110 09/12/2020 0859   TRIG 74 09/12/2020 0859   HDL 43 09/12/2020 0859   CHOLHDL 2.6 09/12/2020 0859   CHOLHDL 3.8 03/16/2016 0829   VLDL 27 03/16/2016 0829   LDLCALC 52 09/12/2020 0859    Physical Exam:    VS:  BP (!) 118/58   Pulse 84   Ht '5\' 1"'$  (1.549 m)   Wt 150 lb (68 kg) Comment: per family member; unable to get on scales  SpO2 95%   BMI 28.34 kg/m     Wt Readings from Last 3 Encounters:  02/06/22 150 lb (68 kg)  08/04/21 149 lb 3.2 oz (67.7 kg)  01/17/21 153 lb (69.4 kg)     GEN: Well nourished, well developed in no acute distress HEENT: Normal NECK: No JVD; No carotid bruits LYMPHATICS: No lymphadenopathy CARDIAC:irregularly irregular, no murmurs, rubs, gallops RESPIRATORY:  Clear to auscultation without rales, wheezing or rhonchi  ABDOMEN: Soft, non-tender, non-distended MUSCULOSKELETAL:  trace edema; No deformity  SKIN: Warm and dry NEUROLOGIC:  Alert and oriented x 3 PSYCHIATRIC:  Normal affect    ASSESSMENT:    1. Chronic atrial fibrillation ([place])    2. Pure hypercholesterolemia   3. Primary hypertension   4. Chronic diastolic CHF (congestive heart failure) ([place])   5. Tachycardia-bradycardia ([place])   6. Atherosclerosis of native coronary artery of native heart without angina pectoris    PLAN:    In order of problems listed above:  1.  Permanent atrial fibrillation -HR well controlled on exam today and he  denies any palpitations -he has not had any bleeding problems on DOAC -continue prescription drug management with Cardizem CD '120mg'$  daily and Apixaban '5mg'$  BID with PRN refills -check CMET and CBC  2.  HLD -LDL goal < 70 -check FLP -continue Pravastatin '40mg'$  daily and Zetia '10mg'$  daily with PRN refills  3. HTN -BP controlled on exam today -continue prescription drug management with Cardizem CD '120mg'$  daily and Losratan '50mg'$  daily with PRN refills -check BMET  4.  Chronic diastolic CHF -this is very stable on low Na diet -weight is stable -appears euvolemic -continue Lasix '80mg'$  BID with PRN refills -check BMET  5.  Tachy-Brady Syndrome -s/p PPM -followed in device clinic  6.  ASCAD -s/p PCI of the RCA remotely -he has not had any anginal sx -no ASA due to DOAC -continue statin   Medication Adjustments/Labs and Tests Ordered: Current medicines are reviewed at length with the patient today.  Concerns regarding medicines are outlined above.  No orders of the defined types were placed in this encounter.  No orders of the defined types were placed in this encounter.   Signed, Fransico Him, MD  02/06/2022 8:30 AM    [place]

## 2022-02-06 ENCOUNTER — Encounter: Payer: Self-pay | Admitting: Cardiology

## 2022-02-06 ENCOUNTER — Ambulatory Visit: Payer: Medicare Other | Admitting: Cardiology

## 2022-02-06 VITALS — BP 118/58 | HR 84 | Ht 61.0 in | Wt 150.0 lb

## 2022-02-06 DIAGNOSIS — I10 Essential (primary) hypertension: Secondary | ICD-10-CM | POA: Diagnosis not present

## 2022-02-06 DIAGNOSIS — I2510 Atherosclerotic heart disease of native coronary artery without angina pectoris: Secondary | ICD-10-CM

## 2022-02-06 DIAGNOSIS — J449 Chronic obstructive pulmonary disease, unspecified: Secondary | ICD-10-CM | POA: Diagnosis not present

## 2022-02-06 DIAGNOSIS — Z0000 Encounter for general adult medical examination without abnormal findings: Secondary | ICD-10-CM | POA: Diagnosis not present

## 2022-02-06 DIAGNOSIS — I482 Chronic atrial fibrillation, unspecified: Secondary | ICD-10-CM

## 2022-02-06 DIAGNOSIS — I5032 Chronic diastolic (congestive) heart failure: Secondary | ICD-10-CM | POA: Diagnosis not present

## 2022-02-06 DIAGNOSIS — E785 Hyperlipidemia, unspecified: Secondary | ICD-10-CM | POA: Diagnosis not present

## 2022-02-06 DIAGNOSIS — E7800 Pure hypercholesterolemia, unspecified: Secondary | ICD-10-CM

## 2022-02-06 DIAGNOSIS — Z1331 Encounter for screening for depression: Secondary | ICD-10-CM | POA: Diagnosis not present

## 2022-02-06 DIAGNOSIS — E039 Hypothyroidism, unspecified: Secondary | ICD-10-CM | POA: Diagnosis not present

## 2022-02-06 DIAGNOSIS — I495 Sick sinus syndrome: Secondary | ICD-10-CM

## 2022-02-06 NOTE — Addendum Note (Signed)
Addended by: Antonieta Iba on: 02/06/2022 08:35 AM   Modules accepted: Orders

## 2022-02-06 NOTE — Patient Instructions (Signed)
Medication Instructions:  Your physician recommends that you continue on your current medications as directed. Please refer to the Current Medication list given to you today.  *If you need a refill on your cardiac medications before your next appointment, please call your pharmacy*  Lab Work: TODAY: CMET, CBC If you have labs (blood work) drawn today and your tests are completely normal, you will receive your results only by: [place] (if you have MyChart) OR A paper copy in the mail If you have any lab test that is abnormal or we need to change your treatment, we will call you to review the results.  Follow-Up: At [facility], you and your health needs are our priority.  As part of our continuing mission to provide you with exceptional heart care, we have created designated Provider Care Teams.  These Care Teams include your primary Cardiologist (physician) and Advanced Practice Providers (APPs -  Physician Assistants and Nurse Practitioners) who all work together to provide you with the care you need, when you need it.  Your next appointment:   1 year(s)  The format for your next appointment:   In Person  Provider:   Fransico Him, MD   Important Information About Sugar

## 2022-02-07 ENCOUNTER — Encounter: Payer: Self-pay | Admitting: Physician Assistant

## 2022-02-07 ENCOUNTER — Ambulatory Visit (INDEPENDENT_AMBULATORY_CARE_PROVIDER_SITE_OTHER): Payer: Medicare Other | Admitting: Physician Assistant

## 2022-02-07 VITALS — BP 114/58 | HR 77 | Ht 61.0 in | Wt 150.0 lb

## 2022-02-07 DIAGNOSIS — Z950 Presence of cardiac pacemaker: Secondary | ICD-10-CM

## 2022-02-07 DIAGNOSIS — I4821 Permanent atrial fibrillation: Secondary | ICD-10-CM | POA: Diagnosis not present

## 2022-02-07 LAB — CUP PACEART INCLINIC DEVICE CHECK
Brady Statistic RA Percent Paced: 0 %
Brady Statistic RV Percent Paced: 47 %
Date Time Interrogation Session: 20230823172920
Implantable Lead Implant Date: 20100211
Implantable Lead Implant Date: 20100211
Implantable Lead Location: 753859
Implantable Lead Location: 753860
Implantable Lead Model: 4456
Implantable Lead Model: 4470
Implantable Lead Serial Number: 313924
Implantable Lead Serial Number: 330954
Implantable Pulse Generator Implant Date: 20100211
Lead Channel Impedance Value: 340 Ohm
Lead Channel Pacing Threshold Amplitude: 0.7 V
Lead Channel Pacing Threshold Pulse Width: 0.4 ms
Lead Channel Sensing Intrinsic Amplitude: 7.2 mV
Lead Channel Setting Pacing Amplitude: 2.4 V
Lead Channel Setting Pacing Pulse Width: 0.4 ms
Lead Channel Setting Sensing Sensitivity: 2 mV
Pulse Gen Serial Number: 594044

## 2022-02-07 LAB — CBC
Hematocrit: 35.1 % — ABNORMAL LOW (ref 37.5–51.0)
Hemoglobin: 12 g/dL — ABNORMAL LOW (ref 13.0–17.7)
MCH: 32.3 pg (ref 26.6–33.0)
MCHC: 34.2 g/dL (ref 31.5–35.7)
MCV: 94 fL (ref 79–97)
Platelets: 249 10*3/uL (ref 150–450)
RBC: 3.72 x10E6/uL — ABNORMAL LOW (ref 4.14–5.80)
RDW: 13.1 % (ref 11.6–15.4)
WBC: 7.1 10*3/uL (ref 3.4–10.8)

## 2022-02-07 LAB — COMPREHENSIVE METABOLIC PANEL
ALT: 5 IU/L (ref 0–44)
AST: 16 IU/L (ref 0–40)
Albumin/Globulin Ratio: 1.6 (ref 1.2–2.2)
Albumin: 3.7 g/dL (ref 3.6–4.6)
Alkaline Phosphatase: 136 IU/L — ABNORMAL HIGH (ref 44–121)
BUN/Creatinine Ratio: 17 (ref 10–24)
BUN: 20 mg/dL (ref 10–36)
Bilirubin Total: 0.7 mg/dL (ref 0.0–1.2)
CO2: 23 mmol/L (ref 20–29)
Calcium: 9 mg/dL (ref 8.6–10.2)
Chloride: 102 mmol/L (ref 96–106)
Creatinine, Ser: 1.2 mg/dL (ref 0.76–1.27)
Globulin, Total: 2.3 g/dL (ref 1.5–4.5)
Glucose: 87 mg/dL (ref 70–99)
Potassium: 3.8 mmol/L (ref 3.5–5.2)
Sodium: 141 mmol/L (ref 134–144)
Total Protein: 6 g/dL (ref 6.0–8.5)
eGFR: 55 mL/min/{1.73_m2} — ABNORMAL LOW (ref >59)

## 2022-02-07 NOTE — Patient Instructions (Addendum)
Medication Instructions:  Your physician recommends that you continue on your current medications as directed. Please refer to the Current Medication list given to you today.  *If you need a refill on your cardiac medications before your next appointment, please call your pharmacy*  NO CHANGES TO YOUR MEDICATIONS.   Lab Work: None ordered.  If you have labs (blood work) drawn today and your tests are completely normal, you will receive your results only by: [place] (if you have MyChart) OR A paper copy in the mail If you have any lab test that is abnormal or we need to change your treatment, we will call you to review the results.  Testing/Procedures: None ordered.  Follow-Up:  SEE INSTRUCTION LETTER    Important Information About Sugar      Pacemaker Battery Change  A pacemaker battery usually lasts 5-15 years (6-7 years on average). A few times a year, you may be asked to visit your health care provider to have a full evaluation of your pacemaker. When the battery is low, your pacemaker will be completely replaced. Most often, this procedure is simpler than the first surgery because the wires (leads) that connect the pacemaker to the heart are already in place. There are many things that affect how long a pacemaker battery will last, including: The age of the pacemaker. The number of leads you have(1, 2, or 3). The use or workload of the pacemaker. If the pacemaker is helping the heart more often, the battery will not last as long. Power (voltage) settings. Tell a health care provider about: Any allergies you have. All medicines you are taking, including vitamins, herbs, eye drops, creams, and over-the-counter medicines. Any problems you or family members have had with anesthetic medicines. Any blood disorders you have. Any surgeries you have had, especially any surgeries you have had since your last pacemaker was placed. Any medical conditions you have. Whether  you are pregnant or may be pregnant. What are the risks? Generally, this is a safe procedure. However, problems may occur, including: Bleeding. Infection. Nerve damage. Allergic reaction to medicines. Damage to the leads that go to the heart. What happens before the procedure? Staying hydrated Follow instructions from your health care provider about hydration, which may include: Up to 2 hours before the procedure - you may continue to drink clear liquids, such as water, clear fruit juice, black coffee, and plain tea. Eating and drinking restrictions Follow instructions from your health care provider about eating and drinking restrictions, which may include: 8 hours before the procedure - stop eating heavy meals or foods, such as meat, fried foods, or fatty foods. 6 hours before the procedure - stop eating light meals or foods, such as toast or cereal. 6 hours before the procedure - stop drinking milk or drinks that contain milk. 2 hours before the procedure - stop drinking clear liquids. Medicines Ask your health care provider about: Changing or stopping your regular medicines. This is especially important if you are taking diabetes medicines or blood thinners. Taking medicines such as aspirin and ibuprofen. These medicines can thin your blood. Do not take these medicines unless your health care provider tells you to take them. Taking over-the-counter medicines, vitamins, herbs, and supplements. General instructions Ask your health care provider what steps will be taken to help prevent infection. These may include: Removing hair at the surgery site. Washing skin with a germ-killing soap. Receiving antibiotic medicine. Plan to have someone take you home from the hospital or clinic.  If you will be going home right after the procedure, plan to have someone with you for 24 hours. What happens during the procedure? An IV will be inserted into one of your veins. You will be given one or  more of the following: A medicine to help you relax (sedative). A medicine to numb the area where the pacemaker is located (local anesthetic). Your health care provider will make an incision to reopen the pocket holding the pacemaker. The old pacemaker will be disconnected from the leads. The leads will be tested. If needed, the leads will be replaced. If the leads are functioning properly, the new pacemaker will be connected to the existing leads. A heart monitor and a pacemaker programmer will be used to make sure that the newly implanted pacemaker is working properly. The incision site will be closed with stitches (sutures), adhesive strips, or skin glue. A bandage (dressing) will be placed over the pacemaker site. The procedure may vary among health care providers and hospitals. What happens after the procedure? Your blood pressure, heart rate, breathing rate, and blood oxygen level will be monitored until you leave the hospital or clinic. You may be given antibiotics. Your health care provider will tell you when your pacemaker will need to be tested again, or when to return to the office for removal of the dressing and sutures. If you were given a sedative during the procedure, it can affect you for several hours. Do not drive or operate machinery until your health care provider says that it is safe. You will be given a pacemaker identification card. This card lists the implant date, device model, and manufacturer of your pacemaker. Summary A pacemaker battery usually lasts 5-15 years (6-7 years on average). When the battery is low, your pacemaker will need to be replaced. Most often, this procedure is simpler than the first surgery because the wires (leads) that connect the pacemaker to the heart are already in place. Risks of this procedure include bleeding, infection, and allergic reactions to medicines. This information is not intended to replace advice given to you by your health care  provider. Make sure you discuss any questions you have with your health care provider. Document Revised: 05/07/2019 Document Reviewed: 05/07/2019 Elsevier Patient Education  [place].

## 2022-02-08 NOTE — Addendum Note (Signed)
Addended by: Drue Novel I on: 02/08/2022 10:49 AM   Modules accepted: Orders

## 2022-02-13 ENCOUNTER — Other Ambulatory Visit: Payer: Self-pay | Admitting: Cardiology

## 2022-02-13 DIAGNOSIS — E785 Hyperlipidemia, unspecified: Secondary | ICD-10-CM | POA: Diagnosis not present

## 2022-02-13 DIAGNOSIS — I119 Hypertensive heart disease without heart failure: Secondary | ICD-10-CM | POA: Diagnosis not present

## 2022-02-13 DIAGNOSIS — E039 Hypothyroidism, unspecified: Secondary | ICD-10-CM | POA: Diagnosis not present

## 2022-02-13 DIAGNOSIS — I5032 Chronic diastolic (congestive) heart failure: Secondary | ICD-10-CM | POA: Diagnosis not present

## 2022-03-12 NOTE — Pre-Procedure Instructions (Signed)
Spoke with patients daughter, [name], patient lives with her.  Instructed daughter on the following items: Arrival time 1330 Nothing to eat or drink after midnight No meds AM of procedure Responsible person to drive you home and stay with you for 24 hrs Wash with special soap night before and morning of procedure If on anti-coagulant drug instructions Eliquis- last dose was 03/09/22

## 2022-03-13 ENCOUNTER — Ambulatory Visit (HOSPITAL_COMMUNITY)
Admission: RE | Admit: 2022-03-13 | Discharge: 2022-03-13 | Disposition: A | Discharge status: Home / Self Care | Payer: Medicare Other | Attending: Cardiology | Admitting: Cardiology

## 2022-03-13 ENCOUNTER — Encounter (HOSPITAL_COMMUNITY)
Admission: RE | Disposition: A | Discharge status: Home / Self Care | Payer: Self-pay | Source: Home / Self Care | Attending: Cardiology

## 2022-03-13 ENCOUNTER — Other Ambulatory Visit: Payer: Self-pay

## 2022-03-13 DIAGNOSIS — Z45010 Encounter for checking and testing of cardiac pacemaker pulse generator [battery]: Secondary | ICD-10-CM | POA: Insufficient documentation

## 2022-03-13 DIAGNOSIS — Z87891 Personal history of nicotine dependence: Secondary | ICD-10-CM | POA: Insufficient documentation

## 2022-03-13 DIAGNOSIS — I495 Sick sinus syndrome: Secondary | ICD-10-CM | POA: Insufficient documentation

## 2022-03-13 DIAGNOSIS — I10 Essential (primary) hypertension: Secondary | ICD-10-CM | POA: Insufficient documentation

## 2022-03-13 DIAGNOSIS — I4821 Permanent atrial fibrillation: Secondary | ICD-10-CM | POA: Diagnosis not present

## 2022-03-13 DIAGNOSIS — J449 Chronic obstructive pulmonary disease, unspecified: Secondary | ICD-10-CM | POA: Diagnosis not present

## 2022-03-13 DIAGNOSIS — I2510 Atherosclerotic heart disease of native coronary artery without angina pectoris: Secondary | ICD-10-CM | POA: Diagnosis not present

## 2022-03-13 DIAGNOSIS — G20 Parkinson's disease: Secondary | ICD-10-CM | POA: Diagnosis not present

## 2022-03-13 DIAGNOSIS — Z950 Presence of cardiac pacemaker: Secondary | ICD-10-CM

## 2022-03-13 HISTORY — PX: BIV PACEMAKER GENERATOR CHANGEOUT: EP1198

## 2022-03-13 LAB — CBC
HCT: 39.1 % (ref 39.0–52.0)
Hemoglobin: 12.6 g/dL — ABNORMAL LOW (ref 13.0–17.0)
MCH: 32.6 pg (ref 26.0–34.0)
MCHC: 32.2 g/dL (ref 30.0–36.0)
MCV: 101.3 fL — ABNORMAL HIGH (ref 80.0–100.0)
Platelets: 243 10*3/uL (ref 150–400)
RBC: 3.86 MIL/uL — ABNORMAL LOW (ref 4.22–5.81)
RDW: 14.2 % (ref 11.5–15.5)
WBC: 7.7 10*3/uL (ref 4.0–10.5)
nRBC: 0 % (ref 0.0–0.2)

## 2022-03-13 LAB — BASIC METABOLIC PANEL
Anion gap: 6 (ref 5–15)
BUN: 20 mg/dL (ref 8–23)
CO2: 28 mmol/L (ref 22–32)
Calcium: 9 mg/dL (ref 8.9–10.3)
Chloride: 105 mmol/L (ref 98–111)
Creatinine, Ser: 1.17 mg/dL (ref 0.61–1.24)
GFR, Estimated: 57 mL/min — ABNORMAL LOW (ref >60)
Glucose, Bld: 92 mg/dL (ref 70–99)
Potassium: 4 mmol/L (ref 3.5–5.1)
Sodium: 139 mmol/L (ref 135–145)

## 2022-03-13 SURGERY — BIV PACEMAKER GENERATOR CHANGEOUT

## 2022-03-13 MED ORDER — POVIDONE-IODINE 10 % EX SWAB: 2.0000 | Freq: Once | CUTANEOUS | Status: DC

## 2022-03-13 MED ORDER — APIXABAN 5 MG PO TABS: ORAL_TABLET | ORAL | 6 refills | Status: DC

## 2022-03-13 MED ORDER — ACETAMINOPHEN 325 MG PO TABS: 325.0000 mg | ORAL_TABLET | ORAL | Status: DC | PRN

## 2022-03-13 MED ORDER — CEFAZOLIN SODIUM-DEXTROSE 2-4 GM/100ML-% IV SOLN
2.0000 g | INTRAVENOUS | Status: AC
  Administered 2022-03-13: 2 g via INTRAVENOUS

## 2022-03-13 MED ORDER — HEPARIN (PORCINE) IN NACL 1000-0.9 UT/500ML-% IV SOLN
INTRAVENOUS | Status: DC | PRN
  Administered 2022-03-13: 500 mL

## 2022-03-13 MED ORDER — LIDOCAINE HCL (PF) 1 % IJ SOLN
INTRAMUSCULAR | Status: DC | PRN
  Administered 2022-03-13: 40 mL

## 2022-03-13 MED ORDER — SODIUM CHLORIDE 0.9 % IV SOLN
INTRAVENOUS | Status: AC
  Filled 2022-03-13: qty 2

## 2022-03-13 MED ORDER — CHLORHEXIDINE GLUCONATE 4 % EX LIQD: 4.0000 | Freq: Once | CUTANEOUS | Status: DC

## 2022-03-13 MED ORDER — CEFAZOLIN SODIUM-DEXTROSE 2-4 GM/100ML-% IV SOLN
INTRAVENOUS | Status: AC
  Filled 2022-03-13: qty 100

## 2022-03-13 MED ORDER — ONDANSETRON HCL 4 MG/2ML IJ SOLN: 4.0000 mg | Freq: Four times a day (QID) | INTRAMUSCULAR | Status: DC | PRN

## 2022-03-13 MED ORDER — SODIUM CHLORIDE 0.9 % IV SOLN: INTRAVENOUS | Status: DC

## 2022-03-13 MED ORDER — LIDOCAINE HCL (PF) 1 % IJ SOLN
INTRAMUSCULAR | Status: AC
  Filled 2022-03-13: qty 60

## 2022-03-13 MED ORDER — SODIUM CHLORIDE 0.9 % IV SOLN
80.0000 mg | INTRAVENOUS | Status: AC
  Administered 2022-03-13: 80 mg

## 2022-03-13 SURGICAL SUPPLY — 6 items
CABLE SURGICAL S-101-97-12 (CABLE) ×2 IMPLANT
PACEMAKER ACCOLADE GR (Pacemaker) IMPLANT
PAD DEFIB RADIO PHYSIO CONN (PAD) ×2 IMPLANT
POUCH AIGIS-R ANTIBACT PPM (Mesh General) ×1 IMPLANT
POUCH AIGIS-R ANTIBACT PPM MED (Mesh General) IMPLANT
TRAY PACEMAKER INSERTION (PACKS) ×2 IMPLANT

## 2022-03-13 NOTE — Discharge Instructions (Signed)

## 2022-03-13 NOTE — H&P (Signed)
Generator Replacement H&P  Date:  03/13/2022  Patient ID:  Jerry Wood, Jerry Wood [birth year], MRN [id number] PCP:  Josetta Huddle, MD        Cardiologist:  Dr. Radford Pax Electrophysiologist: Dr. Rayann Heman   Chief Complaint:  6 mo, battery nearing ERI   History of Present Illness: Jerry Wood is a 88 y.o. male with history of tachy-brady syndrome w/PPM, HTN, COPD, Parkinson's, permanent Afib, CAD w/PCI to RCA "years ago".   Known to be essentially non-ambulatory, able to aid in transfers.   He saw Dr. Rayann Heman, 08/04/21, doing OK, in a wheelchair, not getting out much, hoping to be able to get back to church.  Discussed nearing ERI and need for upcoming gen change, pt/caregiver agreeable. Device not remote capable    Labs looked ok   Presents for generator replacement today.    Device History: [employer] dual chamber PPM implanted 2003 for tachy-brady syndrome; gen change 2010 Programmed VVIR Chronic noise RV lead, not dependent       Past Medical History:  Diagnosis Date   Allergic rhinitis     Cancer ([place]) 09/2015    skin ,basal cell   Chronic knee pain     Constipation     COPD, mild ([place])     Coronary artery disease      s/p PCI of the RCA   Diverticulosis     Dyslipidemia     Gout     Hypertension     Long term (current) use of anticoagulants     Onychomycosis     Osteoarthrosis, unspecified whether generalized or localized, other specified sites      Has gait disorder secondary to DJD of lower extremities   Parkinson's disease ([place]) 03/2012    Dr Vilinda Blanks started   Permanent atrial fibrillation ([place])      Chronic. ECHO 06/04/11 LVEF estimated by 2D at 52.1%   Pure hypercholesterolemia     Seborrheic keratosis, inflamed     Skin lesion      Plan to remove left posterior ear lesion with electrocautery   Tachy-brady syndrome [facility])      s/p permanent pacemaker placement   Unspecified hypothyroidism     Vertigo     Vitamin D deficiency             Past  Surgical History:  Procedure Laterality Date   FRACTURE SURGERY Right      clavicle   INGUINAL HERNIA REPAIR Bilateral     LAPAROSCOPIC PARTIAL COLECTOMY        Partial resection of sigmoid colon   PACEMAKER PLACEMENT   2003    Tachybradycardia syndrome. Gen change 07/29/08, Dr. Leonia Reeves   TYMPANOPLASTY Bilateral              Current Outpatient Medications  Medication Sig Dispense Refill   carbidopa-levodopa (SINEMET IR) 25-100 MG tablet TAKE 2 TABLETS BY MOUTH THREE TIMES DAILY 540 tablet 4   Cholecalciferol (VITAMIN D3) 2000 UNITS TABS Take 2,000 Units by mouth daily.       diltiazem (CARDIZEM CD) 120 MG 24 hr capsule TAKE 1 CAPSULE(120 MG) BY MOUTH DAILY 90 capsule 3   Docusate Calcium (STOOL SOFTENER PO) Take 100 mg by mouth as directed.       ELIQUIS 5 MG TABS tablet TAKE 1 TABLET(5 MG) BY MOUTH TWICE DAILY 60 tablet 6   ezetimibe (ZETIA) 10 MG tablet Take 1 tablet (10 mg total) by mouth daily. 90 tablet 0   fexofenadine (  ALLEGRA) 60 MG tablet Take 60 mg by mouth daily as needed for allergies.        furosemide (LASIX) 80 MG tablet Take 80 mg by mouth 2 (two) times daily.        levothyroxine (SYNTHROID, LEVOTHROID) 75 MCG tablet Take 75 mcg by mouth daily before breakfast.        losartan (COZAAR) 50 MG tablet TAKE 1 TABLET(50 MG) BY MOUTH DAILY 90 tablet 0   Multiple Vitamin (MULTIVITAMIN) capsule Take 1 capsule by mouth daily.       Multiple Vitamins-Minerals (PRESERVISION AREDS) CAPS Take 1 capsule by mouth daily.        potassium chloride SA (K-DUR,KLOR-CON) 20 MEQ tablet Take 30 mEq by mouth daily.       pravastatin (PRAVACHOL) 40 MG tablet TAKE 1 TABLET BY MOUTH EVERY DAY 90 tablet 3   vitamin B-12 (CYANOCOBALAMIN) 1000 MCG tablet Take 1,000 mcg by mouth daily.        No current facility-administered medications for this visit.      Allergies:   Patient has no known allergies.    Social History:  The patient  reports that he quit smoking about 73 years ago. His smoking  use included cigarettes. He has been exposed to tobacco smoke. He has never used smokeless tobacco. He reports that he does not drink alcohol and does not use drugs.    Family History:  The patient's family history includes Bladder Cancer in an other family member; CAD in his father; Diabetes in his father; Leukemia in an other family member.   ROS:  Please see the history of present illness.   All other systems are reviewed and otherwise negative.    PHYSICAL EXAM:  VS:  106/76, 74, 12, AF  Well nourished, well developed, elderly in no acute distress  HEENT: normocephalic, atraumatic  Neck: no JVD, carotid bruits or masses Cardiac:  irreg-irreg; no significant murmurs, no rubs, or gallops Lungs:  CTA b/l, no wheezing, rhonchi or rales  Abd: soft, nontender, obese MS: no deformity, age appropriate atrophy Ext:  Trace-1+ LE edema, L slightly > R (chronically)  waxes/wanes by their report Skin: warm and dry, no rash Neuro:  No gross deficits appreciated Psych: euthymic mood, full affect   PPM site is stable, no tethering or discomfort     EKG:  Done today and reviewed by myself shows:  AFib 71bpm, with VP and VS beats   PPM interrogation done today and reviewed by myself:  Battery has reached ERI Lead measurements are good     02/01/15: TTE Study Conclusions - Left ventricle: The cavity size was normal. Wall thickness was   increased in a pattern of mild LVH. Systolic function was normal.   The estimated ejection fraction was in the range of 55% to 60%.   Wall motion was normal; there were no regional wall motion   abnormalities. - Mitral valve: There was mild regurgitation. - Left atrium: The atrium was severely dilated. - Right atrium: The atrium was mildly dilated. - Pulmonary arteries: Systolic pressure was mildly increased. Impressions: - Normal LV systolic function; severe LAE; mild RAE; mild MR and   TR; mildly elevated pulmonary pressure.   Recent Labs: No  results found for requested labs within last 365 days.  No results found for requested labs within last 365 days.    CrCl cannot be calculated (Patient's most recent lab result is older than the maximum 21 days allowed.).  Wt Readings from Last 3 Encounters:  08/04/21 149 lb 3.2 oz (67.7 kg)  01/17/21 153 lb (69.4 kg)  08/16/20 150 lb 12.8 oz (68.4 kg)      Other studies reviewed: Additional studies/records reviewed today include: summarized above   ASSESSMENT AND PLAN:   1. Tachy-brady syndrome w/PPM      He reached RRT on 12/29/21. No EGMs RV lead measurements are good RV paces 47%   Here is mention of RV noise known for him At my last visit in 2021, noted a single EGM with RV lead noise was not reproducible Unable to assess today Does not appear to be an ongoing problem by recent checks    Met Dr. Quentin Ore today, discussed generator change procedure, potential risks and benefits They are all agreeable No Eliquis 3 days ahead         Presents today for generator replacement. Procedure reviewed.  Risks, benefits, and alternatives to ICD pulse generator replacement were discussed in detail today.  The patient understands that risks include but are not limited to bleeding, infection, pneumothorax, perforation, tamponade, vascular damage, renal failure, MI, stroke, death, inappropriate shocks, damage to his existing leads, and lead dislodgement and wishes to proceed.  We will therefore schedule the procedure at the next available time.    Signed, Hilton Cork. Quentin Ore, MD, [facility], [facility] 03/13/22 3:15 PM

## 2022-03-14 ENCOUNTER — Encounter (HOSPITAL_COMMUNITY): Payer: Self-pay | Admitting: Cardiology

## 2022-03-15 ENCOUNTER — Encounter (HOSPITAL_COMMUNITY): Payer: Self-pay | Admitting: Cardiology

## 2022-03-28 ENCOUNTER — Ambulatory Visit: Payer: Medicare Other | Attending: Cardiology

## 2022-03-28 DIAGNOSIS — I495 Sick sinus syndrome: Secondary | ICD-10-CM

## 2022-03-28 LAB — CUP PACEART INCLINIC DEVICE CHECK
Date Time Interrogation Session: 20231011000000
Implantable Lead Implant Date: 20100211
Implantable Lead Implant Date: 20100211
Implantable Lead Location: 753859
Implantable Lead Location: 753860
Implantable Lead Model: 4456
Implantable Lead Model: 4470
Implantable Lead Serial Number: 313924
Implantable Lead Serial Number: 330954
Implantable Pulse Generator Implant Date: 20230926
Lead Channel Impedance Value: 341 Ohm
Lead Channel Impedance Value: 544 Ohm
Lead Channel Sensing Intrinsic Amplitude: 9 mV
Lead Channel Setting Pacing Amplitude: 1.3 V
Lead Channel Setting Pacing Pulse Width: 0.4 ms
Lead Channel Setting Sensing Sensitivity: 2.5 mV
Pulse Gen Serial Number: 719171

## 2022-03-28 NOTE — Progress Notes (Signed)
Wound check appointment. Steri-strips removed. Wound without redness or edema. Incision edges approximated, wound well healed. Normal device function. Thresholds, sensing, and impedances consistent with implant measurements. Device programmed at 3.5V/auto capture programmed on for extra safety margin until 3 month visit. Histogram distribution appropriate for patient and level of activity. No high ventricular rates noted. Patient educated about wound care, arm mobility, lifting restrictions. ROV in 3 months with implanting physician. 

## 2022-03-28 NOTE — Patient Instructions (Addendum)
   After Your Pacemaker   Monitor your pacemaker site for redness, swelling, and drainage. Call the device clinic at [phone] if you experience these symptoms or fever/chills.  Your incision was closed with Dermabond:  You may shower 1 day after your defibrillator implant and wash your incision with soap and water. Avoid lotions, ointments, or perfumes over your incision until it is well-healed.  You may use a hot tub or a pool after your wound check appointment if the incision is completely closed.  You may drive, unless driving has been restricted by your healthcare providers.  Remote monitoring is used to monitor your pacemaker from home. This monitoring is scheduled every 91 days by our office. It allows us to keep an eye on the functioning of your device to ensure it is working properly. You will routinely see your Electrophysiologist annually (more often if necessary).  

## 2022-04-12 ENCOUNTER — Other Ambulatory Visit: Payer: Self-pay | Admitting: Internal Medicine

## 2022-04-12 NOTE — Telephone Encounter (Signed)
Prescription refill request for Eliquis received. Indication: afib  Last office visit: Jerry Wood, 02/07/2022 Scr: 1.17, 03/13/2022 Age: 88 yo  Weight: 68 kg   Refill sent.

## 2022-04-27 ENCOUNTER — Telehealth: Payer: Self-pay | Admitting: Cardiology

## 2022-04-27 DIAGNOSIS — I482 Chronic atrial fibrillation, unspecified: Secondary | ICD-10-CM

## 2022-04-27 MED ORDER — APIXABAN 5 MG PO TABS: ORAL_TABLET | ORAL | 1 refills | Status: AC

## 2022-04-27 NOTE — Telephone Encounter (Signed)
Eliquis '5mg'$  refill request received. Patient is 88 years old, weight-68kg, Crea-1.17 on 03/13/2022, Diagnosis-Afib, and last seen by Tommye Standard on 02/07/2022. Dose is appropriate based on dosing criteria. Will send in refill to requested pharmacy.

## 2022-04-27 NOTE — Telephone Encounter (Signed)
*  STAT* If patient is at the pharmacy, call can be transferred to refill team.   1. Which medications need to be refilled? (please list name of each medication and dose if known)   ELIQUIS 5 MG TABS tablet    2. Which pharmacy/location (including street and city if local pharmacy) is medication to be sent to? [place], [place]    3. Do they need a 30 day or 90 day supply? [facility]

## 2022-05-03 ENCOUNTER — Emergency Department (HOSPITAL_COMMUNITY): Payer: Medicare Other

## 2022-05-03 ENCOUNTER — Other Ambulatory Visit: Payer: Self-pay

## 2022-05-03 ENCOUNTER — Inpatient Hospital Stay (HOSPITAL_COMMUNITY)
Admission: EM | Admit: 2022-05-03 | Discharge: 2022-05-09 | DRG: 065 | Disposition: A | Discharge status: Skilled Nursing Facility | Payer: Medicare Other | Attending: Internal Medicine | Admitting: Internal Medicine

## 2022-05-03 DIAGNOSIS — Z87891 Personal history of nicotine dependence: Secondary | ICD-10-CM | POA: Diagnosis not present

## 2022-05-03 DIAGNOSIS — E7800 Pure hypercholesterolemia, unspecified: Secondary | ICD-10-CM | POA: Diagnosis present

## 2022-05-03 DIAGNOSIS — H9190 Unspecified hearing loss, unspecified ear: Secondary | ICD-10-CM | POA: Diagnosis present

## 2022-05-03 DIAGNOSIS — Z515 Encounter for palliative care: Secondary | ICD-10-CM | POA: Diagnosis not present

## 2022-05-03 DIAGNOSIS — B351 Tinea unguium: Secondary | ICD-10-CM | POA: Diagnosis not present

## 2022-05-03 DIAGNOSIS — I6381 Other cerebral infarction due to occlusion or stenosis of small artery: Secondary | ICD-10-CM | POA: Diagnosis not present

## 2022-05-03 DIAGNOSIS — D631 Anemia in chronic kidney disease: Secondary | ICD-10-CM | POA: Diagnosis not present

## 2022-05-03 DIAGNOSIS — G8194 Hemiplegia, unspecified affecting left nondominant side: Secondary | ICD-10-CM | POA: Diagnosis not present

## 2022-05-03 DIAGNOSIS — G459 Transient cerebral ischemic attack, unspecified: Secondary | ICD-10-CM | POA: Diagnosis present

## 2022-05-03 DIAGNOSIS — R5381 Other malaise: Secondary | ICD-10-CM | POA: Diagnosis present

## 2022-05-03 DIAGNOSIS — K5900 Constipation, unspecified: Secondary | ICD-10-CM | POA: Diagnosis present

## 2022-05-03 DIAGNOSIS — I63411 Cerebral infarction due to embolism of right middle cerebral artery: Principal | ICD-10-CM | POA: Diagnosis present

## 2022-05-03 DIAGNOSIS — E039 Hypothyroidism, unspecified: Secondary | ICD-10-CM | POA: Diagnosis not present

## 2022-05-03 DIAGNOSIS — Z8052 Family history of malignant neoplasm of bladder: Secondary | ICD-10-CM | POA: Diagnosis not present

## 2022-05-03 DIAGNOSIS — N1832 Chronic kidney disease, stage 3b: Secondary | ICD-10-CM | POA: Diagnosis not present

## 2022-05-03 DIAGNOSIS — R293 Abnormal posture: Secondary | ICD-10-CM | POA: Diagnosis not present

## 2022-05-03 DIAGNOSIS — I9589 Other hypotension: Secondary | ICD-10-CM | POA: Diagnosis not present

## 2022-05-03 DIAGNOSIS — M1990 Unspecified osteoarthritis, unspecified site: Secondary | ICD-10-CM | POA: Diagnosis present

## 2022-05-03 DIAGNOSIS — I495 Sick sinus syndrome: Secondary | ICD-10-CM | POA: Diagnosis present

## 2022-05-03 DIAGNOSIS — R0989 Other specified symptoms and signs involving the circulatory and respiratory systems: Secondary | ICD-10-CM | POA: Diagnosis not present

## 2022-05-03 DIAGNOSIS — M6281 Muscle weakness (generalized): Secondary | ICD-10-CM | POA: Diagnosis not present

## 2022-05-03 DIAGNOSIS — I129 Hypertensive chronic kidney disease with stage 1 through stage 4 chronic kidney disease, or unspecified chronic kidney disease: Secondary | ICD-10-CM | POA: Diagnosis not present

## 2022-05-03 DIAGNOSIS — R29709 NIHSS score 9: Secondary | ICD-10-CM | POA: Diagnosis present

## 2022-05-03 DIAGNOSIS — Z993 Dependence on wheelchair: Secondary | ICD-10-CM

## 2022-05-03 DIAGNOSIS — R29818 Other symptoms and signs involving the nervous system: Secondary | ICD-10-CM | POA: Diagnosis not present

## 2022-05-03 DIAGNOSIS — R1312 Dysphagia, oropharyngeal phase: Secondary | ICD-10-CM | POA: Diagnosis not present

## 2022-05-03 DIAGNOSIS — J189 Pneumonia, unspecified organism: Secondary | ICD-10-CM | POA: Diagnosis not present

## 2022-05-03 DIAGNOSIS — Z66 Do not resuscitate: Secondary | ICD-10-CM | POA: Diagnosis not present

## 2022-05-03 DIAGNOSIS — Z789 Other specified health status: Secondary | ICD-10-CM | POA: Diagnosis not present

## 2022-05-03 DIAGNOSIS — Z79890 Hormone replacement therapy (postmenopausal): Secondary | ICD-10-CM

## 2022-05-03 DIAGNOSIS — I4821 Permanent atrial fibrillation: Secondary | ICD-10-CM | POA: Diagnosis present

## 2022-05-03 DIAGNOSIS — R29810 Facial weakness: Secondary | ICD-10-CM

## 2022-05-03 DIAGNOSIS — M109 Gout, unspecified: Secondary | ICD-10-CM | POA: Diagnosis present

## 2022-05-03 DIAGNOSIS — Z23 Encounter for immunization: Secondary | ICD-10-CM | POA: Diagnosis not present

## 2022-05-03 DIAGNOSIS — I480 Paroxysmal atrial fibrillation: Secondary | ICD-10-CM | POA: Diagnosis not present

## 2022-05-03 DIAGNOSIS — G20A1 Parkinson's disease without dyskinesia, without mention of fluctuations: Secondary | ICD-10-CM | POA: Diagnosis present

## 2022-05-03 DIAGNOSIS — I2510 Atherosclerotic heart disease of native coronary artery without angina pectoris: Secondary | ICD-10-CM | POA: Diagnosis present

## 2022-05-03 DIAGNOSIS — R4781 Slurred speech: Secondary | ICD-10-CM | POA: Diagnosis present

## 2022-05-03 DIAGNOSIS — Z806 Family history of leukemia: Secondary | ICD-10-CM

## 2022-05-03 DIAGNOSIS — I639 Cerebral infarction, unspecified: Secondary | ICD-10-CM

## 2022-05-03 DIAGNOSIS — R41841 Cognitive communication deficit: Secondary | ICD-10-CM | POA: Diagnosis not present

## 2022-05-03 DIAGNOSIS — R531 Weakness: Secondary | ICD-10-CM

## 2022-05-03 DIAGNOSIS — J449 Chronic obstructive pulmonary disease, unspecified: Secondary | ICD-10-CM | POA: Diagnosis present

## 2022-05-03 DIAGNOSIS — I350 Nonrheumatic aortic (valve) stenosis: Secondary | ICD-10-CM | POA: Diagnosis not present

## 2022-05-03 DIAGNOSIS — Z9861 Coronary angioplasty status: Secondary | ICD-10-CM

## 2022-05-03 DIAGNOSIS — Z7189 Other specified counseling: Secondary | ICD-10-CM | POA: Diagnosis not present

## 2022-05-03 DIAGNOSIS — Z8249 Family history of ischemic heart disease and other diseases of the circulatory system: Secondary | ICD-10-CM

## 2022-05-03 DIAGNOSIS — Z7901 Long term (current) use of anticoagulants: Secondary | ICD-10-CM

## 2022-05-03 DIAGNOSIS — Z833 Family history of diabetes mellitus: Secondary | ICD-10-CM

## 2022-05-03 DIAGNOSIS — Z950 Presence of cardiac pacemaker: Secondary | ICD-10-CM

## 2022-05-03 DIAGNOSIS — Z79899 Other long term (current) drug therapy: Secondary | ICD-10-CM

## 2022-05-03 DIAGNOSIS — I959 Hypotension, unspecified: Secondary | ICD-10-CM | POA: Diagnosis not present

## 2022-05-03 LAB — CBC
HCT: 37.9 % — ABNORMAL LOW (ref 39.0–52.0)
Hemoglobin: 12.3 g/dL — ABNORMAL LOW (ref 13.0–17.0)
MCH: 32.8 pg (ref 26.0–34.0)
MCHC: 32.5 g/dL (ref 30.0–36.0)
MCV: 101.1 fL — ABNORMAL HIGH (ref 80.0–100.0)
Platelets: 248 10*3/uL (ref 150–400)
RBC: 3.75 MIL/uL — ABNORMAL LOW (ref 4.22–5.81)
RDW: 14.5 % (ref 11.5–15.5)
WBC: 10.2 10*3/uL (ref 4.0–10.5)
nRBC: 0 % (ref 0.0–0.2)

## 2022-05-03 LAB — COMPREHENSIVE METABOLIC PANEL
ALT: 6 U/L (ref 0–44)
AST: 22 U/L (ref 15–41)
Albumin: 3.6 g/dL (ref 3.5–5.0)
Alkaline Phosphatase: 108 U/L (ref 38–126)
Anion gap: 12 (ref 5–15)
BUN: 24 mg/dL — ABNORMAL HIGH (ref 8–23)
CO2: 26 mmol/L (ref 22–32)
Calcium: 9.1 mg/dL (ref 8.9–10.3)
Chloride: 103 mmol/L (ref 98–111)
Creatinine, Ser: 1.36 mg/dL — ABNORMAL HIGH (ref 0.61–1.24)
GFR, Estimated: 47 mL/min — ABNORMAL LOW (ref >60)
Glucose, Bld: 109 mg/dL — ABNORMAL HIGH (ref 70–99)
Potassium: 3.8 mmol/L (ref 3.5–5.1)
Sodium: 141 mmol/L (ref 135–145)
Total Bilirubin: 0.7 mg/dL (ref 0.3–1.2)
Total Protein: 6.5 g/dL (ref 6.5–8.1)

## 2022-05-03 LAB — RAPID URINE DRUG SCREEN, HOSP PERFORMED
Amphetamines: NOT DETECTED
Barbiturates: NOT DETECTED
Benzodiazepines: NOT DETECTED
Cocaine: NOT DETECTED
Opiates: NOT DETECTED
Tetrahydrocannabinol: NOT DETECTED

## 2022-05-03 LAB — DIFFERENTIAL
Abs Immature Granulocytes: 0.04 10*3/uL (ref 0.00–0.07)
Basophils Absolute: 0.1 10*3/uL (ref 0.0–0.1)
Basophils Relative: 1 %
Eosinophils Absolute: 0.4 10*3/uL (ref 0.0–0.5)
Eosinophils Relative: 4 %
Immature Granulocytes: 0 %
Lymphocytes Relative: 21 %
Lymphs Abs: 2.1 10*3/uL (ref 0.7–4.0)
Monocytes Absolute: 0.7 10*3/uL (ref 0.1–1.0)
Monocytes Relative: 7 %
Neutro Abs: 6.9 10*3/uL (ref 1.7–7.7)
Neutrophils Relative %: 67 %

## 2022-05-03 LAB — POCT I-STAT, CHEM 8
BUN: 25 mg/dL — ABNORMAL HIGH (ref 8–23)
Calcium, Ion: 1.09 mmol/L — ABNORMAL LOW (ref 1.15–1.40)
Chloride: 102 mmol/L (ref 98–111)
Creatinine, Ser: 1.4 mg/dL — ABNORMAL HIGH (ref 0.61–1.24)
Glucose, Bld: 105 mg/dL — ABNORMAL HIGH (ref 70–99)
HCT: 37 % — ABNORMAL LOW (ref 39.0–52.0)
Hemoglobin: 12.6 g/dL — ABNORMAL LOW (ref 13.0–17.0)
Potassium: 3.8 mmol/L (ref 3.5–5.1)
Sodium: 141 mmol/L (ref 135–145)
TCO2: 27 mmol/L (ref 22–32)

## 2022-05-03 LAB — APTT: aPTT: 36 seconds (ref 24–36)

## 2022-05-03 LAB — CBG MONITORING, ED: Glucose-Capillary: 108 mg/dL — ABNORMAL HIGH (ref 70–99)

## 2022-05-03 LAB — ETHANOL: Alcohol, Ethyl (B): <10 mg/dL (ref <10)

## 2022-05-03 LAB — URINALYSIS, ROUTINE W REFLEX MICROSCOPIC
Bilirubin Urine: NEGATIVE
Glucose, UA: NEGATIVE mg/dL
Hgb urine dipstick: NEGATIVE
Ketones, ur: NEGATIVE mg/dL
Leukocytes,Ua: NEGATIVE
Nitrite: NEGATIVE
Protein, ur: NEGATIVE mg/dL
Specific Gravity, Urine: 1.009 (ref 1.005–1.030)
pH: 6 (ref 5.0–8.0)

## 2022-05-03 LAB — PROTIME-INR
INR: 1.2 (ref 0.8–1.2)
Prothrombin Time: 14.9 seconds (ref 11.4–15.2)

## 2022-05-03 NOTE — ED Notes (Addendum)
Patient placed on the bedpan. Patient did not have a bowel movement. Patient's sheets cleaned and perineum cleansed.

## 2022-05-03 NOTE — ED Notes (Signed)
Family at bedside. 

## 2022-05-03 NOTE — ED Triage Notes (Signed)
Pt arrived with [facility] from home, last known well at 1600. Pt has left sided facial droop, left sided weakness that was increasing, and persistent wet cough. Pt appears to have had an episode of emesis, that covered his shirt on arrival. Pt has Pt alert and oriented on arrival.  Hx of falls, parkinson's, and + eliquis  CBG 154

## 2022-05-03 NOTE — ED Notes (Signed)
ED Provider (resident) at bedside to discuss plan of care

## 2022-05-03 NOTE — ED Notes (Signed)
Patient denies any complaints and denied pain. [name] remain at the bedside. Primofit remains in place. Daughters report lower extremity weakness from Parkinson's and his lower extremities appear to be at their baseline according to daughters.

## 2022-05-03 NOTE — ED Provider Notes (Signed)
[facility] Provider Note   CSN: [id number] Arrival date & time: 05/03/22  1718  An emergency department physician performed an initial assessment on this suspected stroke patient at 64.  History  Chief Complaint  Patient presents with   Code Stroke    SHAYLON ADEN is a 88 year old male with history of history of tachy-brady syndrome w/ PPM, HTN, COPD, Parkinson's, permanent Afib on Eliquis, CAD w/PCI to RCA who presents to the ED as code stroke.  Per EMS, last known well was 1600.  Patient noted to have left-sided facial droop which is new per family.  Reports that a little after 4 PM, his wife heard him moaning in his bedroom and when she went to check on him he was lying on his side had difficulty sitting up.  She then helped him up and try to give him something to drink, but he was unable to swallow it which is not typical for him.  His daughter then came 15 minutes later, and noticed that he could still not swallow and that the left side of his face seemed droopy and his speech seemed altered from normal with difficulty forming words.  Family also reports a persistent wet cough.  No other recent illnesses, including fevers, had otherwise been in his normal state of health.  They report that he is not mobile at baseline, has significant lower extremity weakness at baseline and requires a wheelchair at home.  The history is provided by the patient and a relative.       Home Medications Prior to Admission medications   Medication Sig Start Date End Date Taking? Authorizing Provider  acetaminophen (TYLENOL) 500 MG tablet Take 1,000 mg by mouth every 6 (six) hours as needed for moderate pain.   Yes [provider]  apixaban (ELIQUIS) 5 MG TABS tablet TAKE 1 TABLET(5 MG) BY MOUTH TWICE DAILY Patient taking differently: Take 5 mg by mouth 2 (two) times daily. 04/27/22  Yes Camnitz, Ocie Doyne, MD  calcium carbonate (TUMS - DOSED IN MG  ELEMENTAL CALCIUM) 500 MG chewable tablet Chew 1 tablet by mouth daily as needed for indigestion or heartburn.   Yes [provider]  carbidopa-levodopa (SINEMET IR) 25-100 MG tablet TAKE 2 TABLETS BY MOUTH THREE TIMES DAILY Patient taking differently: Take 2 tablets by mouth 3 (three) times daily. 10/10/21  Yes Penumalli, [name], MD  diltiazem (CARDIZEM CD) 120 MG 24 hr capsule TAKE 1 CAPSULE(120 MG) BY MOUTH DAILY Patient taking differently: Take 120 mg by mouth daily. 10/17/21  Yes Allred, Jeneen Rinks, MD  ezetimibe (ZETIA) 10 MG tablet TAKE 1 TABLET(10 MG) BY MOUTH DAILY Patient taking differently: Take 10 mg by mouth daily. 02/13/22  Yes Turner, Eber Hong, MD  furosemide (LASIX) 80 MG tablet Take 80 mg by mouth 2 (two) times daily.    Yes [provider]  levothyroxine (SYNTHROID, LEVOTHROID) 75 MCG tablet Take 75 mcg by mouth daily before breakfast.  11/15/12  Yes [provider]  loratadine (CLARITIN) 10 MG tablet Take 10 mg by mouth daily.   Yes [provider]  losartan (COZAAR) 50 MG tablet TAKE 1 TABLET(50 MG) BY MOUTH DAILY Patient taking differently: Take 50 mg by mouth daily. 02/13/22  Yes Turner, Eber Hong, MD  Multiple Vitamin (MULTIVITAMIN) capsule Take 1 capsule by mouth daily.   Yes [provider]  Multiple Vitamins-Minerals (PRESERVISION AREDS) CAPS Take 1 capsule by mouth daily.    Yes [provider]  potassium chloride SA (K-DUR,KLOR-CON) 20 MEQ tablet Take 30 mEq by mouth daily.   Yes [provider]  pravastatin (PRAVACHOL) 40 MG tablet TAKE 1 TABLET BY MOUTH EVERY DAY Patient taking differently: Take 40 mg by mouth every evening. 05/27/18  Yes Turner, Eber Hong, MD  senna-docusate (COLACE 2-IN-1) 8.6-50 MG tablet Take 1 tablet by mouth daily.   Yes [provider]  vitamin B-12 (CYANOCOBALAMIN) 1000 MCG tablet Take 1,000 mcg by mouth daily.   Yes [provider]  imiquimod (ALDARA) 5 % cream Apply 1  Application topically daily. Patient not taking: Reported on 05/03/2022 02/06/22   [provider]      Allergies    Patient has no known allergies.    Review of Systems   Review of Systems   See HPI.   Physical Exam Updated Vital Signs BP 107/67 (BP Location: Left Arm)   Pulse 76   Temp 98.4 F (36.9 C) (Oral)   Resp 16   Wt 71.7 kg   SpO2 96%   BMI 29.87 kg/m   Physical Exam Vitals and nursing note reviewed.  HENT:     Head: Normocephalic and atraumatic.     Mouth/Throat:     Comments: Tongue fasciculations, at baseline with Parkinson's. Eyes:     Extraocular Movements: Extraocular movements intact.     Pupils: Pupils are equal, round, and reactive to light.  Cardiovascular:     Rate and Rhythm: Normal rate.     Heart sounds: Murmur heard.  Pulmonary:     Effort: Pulmonary effort is normal.     Breath sounds: Normal breath sounds.  Abdominal:     General: There is no distension.     Palpations: Abdomen is soft.     Tenderness: There is no abdominal tenderness.  Musculoskeletal:        General: No deformity.     Cervical back: Normal range of motion.  Skin:    General: Skin is warm and dry.  Neurological:     Mental Status: He is alert.     Comments: Awake, alert, oriented to person, month, place.  Believes NP at bedside is his daughter.  Left facial droop, left nasolabial fold flattening.  Hearing intact.  Sensation intact to light touch on both sides of face. Tongue midline.  Speech hypophonic, dysarthric.  Decreased ROM of L shoulder joint, patient states is chronic.  RUE elevates antigravity without vertical drift. Bilateral lower extremities barely able to elevate against gravity, family states at baseline. Tone is normal. Bulk is normal.  Sensation intact to touch in extremities x4.      ED Results / Procedures / Treatments   Labs (all labs ordered are listed, but only abnormal results are displayed) Labs Reviewed  CBC - Abnormal;  Notable for the following components:      Result Value   RBC 3.75 (*)    Hemoglobin 12.3 (*)    HCT 37.9 (*)    MCV 101.1 (*)    All other components within normal limits  COMPREHENSIVE METABOLIC PANEL - Abnormal; Notable for the following components:   Glucose, Bld 109 (*)    BUN 24 (*)    Creatinine, Ser 1.36 (*)    GFR, Estimated 47 (*)    All other components within normal limits  URINALYSIS, ROUTINE W REFLEX MICROSCOPIC - Abnormal; Notable for the following components:   APPearance HAZY (*)    All other components within normal limits  CBG  MONITORING, ED - Abnormal; Notable for the following components:   Glucose-Capillary 108 (*)    All other components within normal limits  POCT I-STAT, CHEM 8 - Abnormal; Notable for the following components:   BUN 25 (*)    Creatinine, Ser 1.40 (*)    Glucose, Bld 105 (*)    Calcium, Ion 1.09 (*)    Hemoglobin 12.6 (*)    HCT 37.0 (*)    All other components within normal limits  ETHANOL  PROTIME-INR  APTT  DIFFERENTIAL  RAPID URINE DRUG SCREEN, HOSP PERFORMED  I-STAT CHEM 8, ED    EKG None  Radiology DG Chest 1 View  Result Date: 05/03/2022 CLINICAL DATA:  [id number] Pneumonia [id number] EXAM: CHEST  1 VIEW COMPARISON:  Chest x-ray 01/26/2015 FINDINGS: Left chest wall 2 lead pacemaker in grossly appropriate position. The heart and mediastinal contours are unchanged. No focal consolidation. No pulmonary edema. No pleural effusion. No pneumothorax. No acute osseous abnormality. IMPRESSION: No active disease. Electronically Signed   By: Iven Finn M.D.   On: 05/03/2022 18:20   CT HEAD CODE STROKE WO CONTRAST  Result Date: 05/03/2022 CLINICAL DATA:  Code stroke. EXAM: CT HEAD WITHOUT CONTRAST TECHNIQUE: Contiguous axial images were obtained from the base of the skull through the vertex without intravenous contrast. RADIATION DOSE REDUCTION: This exam was performed according to the departmental dose-optimization program which includes  automated exposure control, adjustment of the mA and/or kV according to patient size and/or use of iterative reconstruction technique. COMPARISON:  CT head 07/21/2012 FINDINGS: Brain: There is no acute intracranial hemorrhage, extra-axial fluid collection, or acute infarct. Parenchymal volume is normal for age. The ventricles are normal in size. Gray-white differentiation is preserved. There is a small remote infarct in the right caudate head and small remote infarcts in the bilateral cerebellar hemispheres, unchanged since 2014. There is no mass lesion.  There is no mass effect or midline shift. Vascular: No dense vessel is seen. There is calcification of the bilateral carotid siphons. Skull: Normal. Negative for fracture or focal lesion. Sinuses/Orbits: The paranasal sinuses are clear. Bilateral lens implants are in place. The globes and orbits are otherwise unremarkable. Other: None. ASPECTS [facility] Stroke Program Early CT Score) - Ganglionic level infarction (caudate, lentiform nuclei, internal capsule, insula, M1-M3 cortex): 7 - Supraganglionic infarction (M4-M6 cortex): 3 Total score (0-10 with 10 being normal): 10 IMPRESSION: 1. No acute intracranial pathology. 2. ASPECTS is 10 Findings communicated to Dr. Rory Percy at 5:38 p.m. Electronically Signed   By: Valetta Mole M.D.   On: 05/03/2022 17:42    Procedures Procedures    Medications Ordered in ED Medications - No data to display  ED Course/ Medical Decision Making/ A&P                           Medical Decision Making Problems Addressed: Facial droop: acute illness or injury that poses a threat to life or bodily functions Weakness: acute illness or injury that poses a threat to life or bodily functions  Amount and/or Complexity of Data Reviewed Independent Historian: caregiver External Data Reviewed: labs and notes. Labs: ordered. Decision-making details documented in ED Course. Radiology: ordered and independent interpretation performed.  Decision-making details documented in ED Course. Discussion of management or test interpretation with external provider(s): Neurology   Risk Decision regarding hospitalization.   DG Chest 1 View  Final Result    CT HEAD CODE STROKE WO CONTRAST  Final Result  MR BRAIN WO CONTRAST    (Results Pending)    LARELL BANEY is a 88 y.o. male with PMHX of  tachy-brady syndrome w/ PPM, HTN, COPD, Parkinson's, permanent Afib on Eliquis, CAD w/PCI to RCA who presents with left facial droop and weakness, concerning for a stroke. CODE STROKE was initiated on this patient. Patient is on Eliquis. LKW 16:00.   The patient was quickly assessed by myself and the attending. The patient was maintaining their own airway.The stroke team was emergently consulted and present at bedside quickly after arrival. A through neurological exam was performed, and pertinent findings include left facial droop.   CT scanner was made available and the patient was rapidly transported. Head CT full report in chart, but in summary revealed:   CTH: No acute intracranial pathology on my independent review or review by radiology.  Small remote infarcts in right caudate head and bilateral cerebellar hemisphere years, unchanged since 2014 per radiology.  Labs and imaging reviewed by myself and considered in medical decision making if ordered.  Imaging interpreted by radiology and additionally by me, Neurology team.   Other causes of the neurological findings were considered, and include toxic causes (hypoglycemia, renal failure, hepatic failure, toxic ingestion), other neurologic disorders (seizure, GBS, disc herniation, diabetic neuropathy), hypertensive emergency, and trauma (ICH, spinal injury), however history, PE findings, and imaging overall findings not consistent though will obtain labs to further evaluate for possible electrolyte derangements.  Given reports of cough, will obtain chest x-ray to evaluate for possible  pneumonia.  We will additionally obtain labs including CBC, UA to evaluate for possible infection including UTI.  No neck stiffness on exam or fever to suggest meningitis.  Dr. Rory Percy (Neurology) was present for Code Stroke recommended labs, MRI.   Remainder of workup n overall unremarkable.  Labs demonstrate no significant anemia, hemoglobin 12.3, no leukocytosis to suggest significant infection.  CMP with creatinine 1.36, not significantly elevated above baseline, glucose 109, no electrolyte derangements.  UA with negative nitrites, negative leukocytes, no bacteria noted, no evidence of UTI.  UDS negative.  CXR on my independent review as well as review by radiology demonstrates PPM, no focal consolidation to suggest pneumonia, overall unremarkable.  On repeat examination, facial droop appears improved at this time, confirmed by family at bedside.  Unfortunately MRI is unavailable until tomorrow morning as patient has a pacemaker which needs further evaluation for MRI compatibility.  Given this as well as acute neurologic change with new dysphagia, feel patient would benefit from observation admission.  Patient admitted to hospitalist service under observation for further management.  Patient was discussed with my attending, Dr. Reather Converse, who provided oversight and voiced agreement with this assessment and plan.          Final Clinical Impression(s) / ED Diagnoses Final diagnoses:  Facial droop  Weakness    Rx / DC Orders ED Discharge Orders     None         Renard Matter, MD 05/03/22 [id number]    Elnora Morrison, MD 05/04/22 0041

## 2022-05-03 NOTE — Consult Note (Addendum)
Neurology Consultation  Reason for Consult: Code Stroke Referring Physician: Dr. Reather Converse  CC: Left-sided weakness  History is obtained from: EMS, Chart review, patient's daughter via telephone   HPI: Jerry Wood is a 88 y.o. male with a medical history significant for HLD, HTN, CAD, Parkinson's disease wheelchair bound (transfers only from chair to chair) and hypophonia, and permanent atrial fibrillation on Eliquis who presented to the ED from home via EMS for evaluation of acute onset of left facial weakness, slurred speech, and sudden onset of left-sided weakness witnessed by family.  Patient's last known well was 16:00 today per family. Patient is reportedly compliant with his Eliquis dosing.    LKW: 16:00 05/03/2022 TNK given?: no, patient on full anticoagulation with Eliquis, no missed doses per family  IR Thrombectomy? No, presentation not consistent with LVO, mRS 4 Modified Rankin Scale: 4-Needs assistance to walk and tend to bodily needs  ROS: A complete ROS was performed and is negative except as noted in the HPI.   Past Medical History:  Diagnosis Date   Allergic rhinitis    Cancer ([place]) 09/2015   skin ,basal cell   Chronic knee pain    Constipation    COPD, mild (HCC)    Coronary artery disease    s/p PCI of the RCA   Diverticulosis    Dyslipidemia    Gout    Hypertension    Long term (current) use of anticoagulants    Onychomycosis    Osteoarthrosis, unspecified whether generalized or localized, other specified sites    Has gait disorder secondary to DJD of lower extremities   Parkinson's disease ([place]) 03/2012   Dr Vilinda Blanks started   Permanent atrial fibrillation ([place])    Chronic. ECHO 06/04/11 LVEF estimated by 2D at 52.1%   Pure hypercholesterolemia    Seborrheic keratosis, inflamed    Skin lesion    Plan to remove left posterior ear lesion with electrocautery   Tachy-brady syndrome [facility])    s/p permanent pacemaker placement   Unspecified  hypothyroidism    Vertigo    Vitamin D deficiency    Past Surgical History:  Procedure Laterality Date   BIV PACEMAKER GENERATOR CHANGEOUT N/A 03/13/2022   Procedure: BIV PACEMAKER GENERATOR CHANGEOUT;  Surgeon: Vickie Epley, MD;  Location: [place] [facility];  Service: Cardiovascular;  Laterality: N/A;   FRACTURE SURGERY Right    clavicle   INGUINAL HERNIA REPAIR Bilateral    LAPAROSCOPIC PARTIAL COLECTOMY     Partial resection of sigmoid colon   PACEMAKER PLACEMENT  2003   Tachybradycardia syndrome. Gen change 07/29/08, Dr. Leonia Reeves   TYMPANOPLASTY Bilateral    Family History  Problem Relation Age of Onset   CAD Father    Diabetes Father    Bladder Cancer Other    Leukemia Other    Social History:   reports that he quit smoking about 73 years ago. His smoking use included cigarettes. He has been exposed to tobacco smoke. He has never used smokeless tobacco. He reports that he does not drink alcohol and does not use drugs.  Medications No current facility-administered medications for this encounter.  Current Outpatient Medications:    acetaminophen (TYLENOL) 500 MG tablet, Take 1,000 mg by mouth every 6 (six) hours as needed for moderate pain., Disp: , Rfl:    apixaban (ELIQUIS) 5 MG TABS tablet, TAKE 1 TABLET(5 MG) BY MOUTH TWICE DAILY, Disp: 180 tablet, Rfl: 1   calcium carbonate (TUMS - DOSED IN MG ELEMENTAL CALCIUM)  500 MG chewable tablet, Chew 1 tablet by mouth daily as needed for indigestion or heartburn., Disp: , Rfl:    carbidopa-levodopa (SINEMET IR) 25-100 MG tablet, TAKE 2 TABLETS BY MOUTH THREE TIMES DAILY, Disp: 540 tablet, Rfl: 4   Cholecalciferol (VITAMIN D3) 2000 UNITS TABS, Take 2,000 Units by mouth daily., Disp: , Rfl:    diltiazem (CARDIZEM CD) 120 MG 24 hr capsule, TAKE 1 CAPSULE(120 MG) BY MOUTH DAILY, Disp: 90 capsule, Rfl: 3   ezetimibe (ZETIA) 10 MG tablet, TAKE 1 TABLET(10 MG) BY MOUTH DAILY, Disp: 90 tablet, Rfl: 1   furosemide (LASIX) 80 MG tablet,  Take 80 mg by mouth 2 (two) times daily. , Disp: , Rfl:    imiquimod (ALDARA) 5 % cream, Apply 1 Application topically daily., Disp: , Rfl:    levothyroxine (SYNTHROID, LEVOTHROID) 75 MCG tablet, Take 75 mcg by mouth daily before breakfast. , Disp: , Rfl:    loratadine (CLARITIN) 10 MG tablet, Take 10 mg by mouth daily., Disp: , Rfl:    losartan (COZAAR) 50 MG tablet, TAKE 1 TABLET(50 MG) BY MOUTH DAILY, Disp: 90 tablet, Rfl: 1   Multiple Vitamin (MULTIVITAMIN) capsule, Take 1 capsule by mouth daily., Disp: , Rfl:    Multiple Vitamins-Minerals (PRESERVISION AREDS) CAPS, Take 1 capsule by mouth daily. , Disp: , Rfl:    potassium chloride SA (K-DUR,KLOR-CON) 20 MEQ tablet, Take 30 mEq by mouth daily., Disp: , Rfl:    pravastatin (PRAVACHOL) 40 MG tablet, TAKE 1 TABLET BY MOUTH EVERY DAY (Patient taking differently: Take 40 mg by mouth every evening.), Disp: 90 tablet, Rfl: 3   senna-docusate (COLACE 2-IN-1) 8.6-50 MG tablet, Take 1 tablet by mouth daily., Disp: , Rfl:    vitamin B-12 (CYANOCOBALAMIN) 1000 MCG tablet, Take 1,000 mcg by mouth daily., Disp: , Rfl:   Exam: Current vital signs: BP 110/75   Pulse 78   Temp 97.7 F (36.5 C) (Oral)   Wt 71.7 kg   BMI 29.87 kg/m  Vital signs in last 24 hours: Temp:  [97.7 F (36.5 C)] 97.7 F (36.5 C) (11/16 1731) Pulse Rate:  [78] 78 (11/16 1726) BP: (110)/(75) 110/75 (11/16 1726) Weight:  [71.7 kg] 71.7 kg (11/16 1725)  GENERAL: Awake, alert, in no acute distress. Patient does have large stains on his shirt and undershirt, presumably from possible vomiting PTA.  Psych: Affect appropriate for situation, patient is calm and cooperative with examination Head: Normocephalic and atraumatic, without obvious abnormality EENT: Normal conjunctivae, dry mucous membranes, no OP obstruction LUNGS: Normal respiratory effort. Non-labored breathing on room air CV: Irregular rate and rhythm on cardiac monitor ABDOMEN: Soft, non-tender,  non-distended Extremities: Warm, well perfused, without obvious deformity  NEURO:  Mental Status: Awake, alert, and oriented to self, age, month, place, and situation. He is able to provide some information regarding history of present illness. He does have slight confusion during exam as he mistakes examiner for his daughter but answers orientation questions appropriately.  Speech/Language: speech is hypophonic and dysarthric. Patient follows simple commands. There is no evidence of aphasia or neglect. Cranial Nerves:  II: PERRL. visual fields full.  III, IV, VI: EOMI, tracks examiner throughout   V: Sensation is intact to light touch and symmetrical to face. VII: Left mouth droop and NLF flattening noted VIII: Hearing is intact to voice with hearing aids in place IX, X: Hypophonic XI: Head is grossly midline XII: Tongue protrudes midline Motor: Right upper extremity elevates antigravity without vertical drift, left upper extremity  elevation is limited related to left shoulder pain (chronic). Bilateral lower extremities move without gravity better and able to elevate against gravity. Tone is normal. Bulk is normal.  Sensation: Intact to light touch bilaterally in all four extremities.  Coordination: Upper extremities without overt dysmetria. HKS unable to perform. 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 and Inatten.: 0 TOTAL: 9  Labs I have reviewed labs in epic and the results pertinent to this consultation are: CBC    Component Value Date/Time   WBC 10.2 05/03/2022 1721   RBC 3.75 (L) 05/03/2022 1721   HGB  12.6 (L) 05/03/2022 1725   HGB 12.0 (L) 02/06/2022 0838   HCT 37.0 (L) 05/03/2022 1725   HCT 35.1 (L) 02/06/2022 0838   PLT 248 05/03/2022 1721   PLT 249 02/06/2022 0838   MCV 101.1 (H) 05/03/2022 1721   MCV 94 02/06/2022 0838   MCH 32.8 05/03/2022 1721   MCHC 32.5 05/03/2022 1721   RDW 14.5 05/03/2022 1721   RDW 13.1 02/06/2022 0838   LYMPHSABS 2.1 05/03/2022 1721   MONOABS 0.7 05/03/2022 1721   EOSABS 0.4 05/03/2022 1721   BASOSABS 0.1 05/03/2022 1721   CMP     Component Value Date/Time   NA 141 05/03/2022 1725   NA 141 02/06/2022 0838   K 3.8 05/03/2022 1725   CL 102 05/03/2022 1725   CO2 28 03/13/2022 1429   GLUCOSE 105 (H) 05/03/2022 1725   BUN 25 (H) 05/03/2022 1725   BUN 20 02/06/2022 0838   CREATININE 1.40 (H) 05/03/2022 1725   CREATININE 1.34 (H) 03/16/2016 0829   CALCIUM 9.0 03/13/2022 1429   PROT 6.0 02/06/2022 0838   ALBUMIN 3.7 02/06/2022 0838   AST 16 02/06/2022 0838   ALT 5 02/06/2022 0838   ALKPHOS 136 (H) 02/06/2022 0838   BILITOT 0.7 02/06/2022 0838   GFRNONAA 57 (L) 03/13/2022 1429   GFRAA 66 08/27/2019 1550   Lipid Panel     Component Value Date/Time   CHOL 110 09/12/2020 0859   TRIG 74 09/12/2020 0859   HDL 43 09/12/2020 0859   CHOLHDL 2.6 09/12/2020 0859   CHOLHDL 3.8 03/16/2016 0829   VLDL 27 03/16/2016 0829   LDLCALC 52 09/12/2020 0859  No results found for: "HGBA1C"  Imaging I have reviewed the images obtained:  CT-scan of the brain 05/03/22: 1. No acute intracranial pathology. 2. ASPECTS is 10  MRI examination of the brain pending  Assessment: 88 y.o. male with PMHx of HLD, HTN, CAD, Parkinson's disease wheelchair bound (transfers only from chair to chair) and hypophonia, and permanent AF on Eliquis who presented for evaluation of sudden onset of left facial weakness and slurred speech. On arrival, patient was noted to have left facial weakness but no overt unilateral weakness noted when taking chronic left shoulder pain into  consideration. Presentation is concerning for a small acute stroke versus progressive Parkinson's disease. Will need to rule out infectious process as well. Patient was not a candidate for TNKase as he is on full dose anticoagulation with Eliquis at home and  family reports medication compliance.   Recommendations: - MRI brain wo contrast  - Will expand full stroke work up if MRI brain with findings of acute ischemic stroke - Infectious work up per EDP -Discussed with daughter over the phone. Discussed with Drs. Ala Bent in the ER  Pt seen by NP/Neuro and later by MD. Note/plan to be edited by MD as needed.  Anibal Henderson, AGAC-NP [facility] Pager: [phone]  Attending Neurohospitalist Addendum Patient seen and examined with APP/Resident. Agree with the history and physical as documented above. Agree with the plan as documented, which I helped formulate. I have independently reviewed the chart, obtained history, review of systems and examined the patient.I have personally reviewed pertinent head/neck/spine imaging (CT/MRI). Please feel free to call with any questions.  -- Amie Portland, MD Neurologist [facility] Pager: [phone]

## 2022-05-03 NOTE — Progress Notes (Signed)
MRI conditional pacer and leads PACEMAKER Building surveyor [phone] [facility] MRI  Lead: Rufus  [id number] [employer] Lead:  [employer] [id number]  [address] [employer] EZ

## 2022-05-03 NOTE — Code Documentation (Addendum)
Pt Jerry Wood is a 88 yr old male arriving to [facility] via EMS on 05/03/2022. He is on Eliquis for AF. He has PMH of Parkinson's, AF, and pacemaker. Pt is from home where he was last known well at 1600, and is now complaining of Lt droop, left weakness and dysarthria.     Stroke team at bridge on pt arrival. Labs, CBG drawn, Airway cleared by EDP. Pt to CT with team. NIHSS 10 (please see documentation for timeline and details). Pt with Left droop, possible left arm drift (shoulder is frozen per pt) and dysarthria on exam. No vision, aphasia or neglect detected. CT non-contrast obtained. This was negative for acute hemorrhage per Dr Rory Percy.  Pt is not a candidate for thrombolytic as on Eliquis. He is not eligible for Endovascular therapy as his clinical exam is LVO negative.    Pt returned to room 16 where his workup will continue. He will need q 2 hr VS and NIHSS for 12 hr, then q 4. Bedside handoff with Elexes RN.

## 2022-05-04 ENCOUNTER — Observation Stay (HOSPITAL_COMMUNITY): Payer: Medicare Other

## 2022-05-04 ENCOUNTER — Observation Stay (HOSPITAL_BASED_OUTPATIENT_CLINIC_OR_DEPARTMENT_OTHER): Payer: Medicare Other

## 2022-05-04 DIAGNOSIS — Z7189 Other specified counseling: Secondary | ICD-10-CM

## 2022-05-04 DIAGNOSIS — R0989 Other specified symptoms and signs involving the circulatory and respiratory systems: Secondary | ICD-10-CM

## 2022-05-04 DIAGNOSIS — Z66 Do not resuscitate: Secondary | ICD-10-CM | POA: Diagnosis not present

## 2022-05-04 DIAGNOSIS — I639 Cerebral infarction, unspecified: Secondary | ICD-10-CM | POA: Diagnosis not present

## 2022-05-04 DIAGNOSIS — Z8052 Family history of malignant neoplasm of bladder: Secondary | ICD-10-CM | POA: Diagnosis not present

## 2022-05-04 DIAGNOSIS — Z23 Encounter for immunization: Secondary | ICD-10-CM | POA: Diagnosis present

## 2022-05-04 DIAGNOSIS — Z515 Encounter for palliative care: Secondary | ICD-10-CM

## 2022-05-04 DIAGNOSIS — J449 Chronic obstructive pulmonary disease, unspecified: Secondary | ICD-10-CM | POA: Diagnosis present

## 2022-05-04 DIAGNOSIS — B351 Tinea unguium: Secondary | ICD-10-CM | POA: Diagnosis present

## 2022-05-04 DIAGNOSIS — R29810 Facial weakness: Secondary | ICD-10-CM

## 2022-05-04 DIAGNOSIS — E7800 Pure hypercholesterolemia, unspecified: Secondary | ICD-10-CM | POA: Diagnosis present

## 2022-05-04 DIAGNOSIS — G459 Transient cerebral ischemic attack, unspecified: Secondary | ICD-10-CM | POA: Diagnosis present

## 2022-05-04 DIAGNOSIS — Z7901 Long term (current) use of anticoagulants: Secondary | ICD-10-CM | POA: Diagnosis not present

## 2022-05-04 DIAGNOSIS — Z993 Dependence on wheelchair: Secondary | ICD-10-CM | POA: Diagnosis not present

## 2022-05-04 DIAGNOSIS — I350 Nonrheumatic aortic (valve) stenosis: Secondary | ICD-10-CM | POA: Diagnosis not present

## 2022-05-04 DIAGNOSIS — I63411 Cerebral infarction due to embolism of right middle cerebral artery: Secondary | ICD-10-CM | POA: Diagnosis present

## 2022-05-04 DIAGNOSIS — I959 Hypotension, unspecified: Secondary | ICD-10-CM

## 2022-05-04 DIAGNOSIS — I4821 Permanent atrial fibrillation: Secondary | ICD-10-CM | POA: Diagnosis present

## 2022-05-04 DIAGNOSIS — R1312 Dysphagia, oropharyngeal phase: Secondary | ICD-10-CM | POA: Diagnosis present

## 2022-05-04 DIAGNOSIS — Z87891 Personal history of nicotine dependence: Secondary | ICD-10-CM | POA: Diagnosis not present

## 2022-05-04 DIAGNOSIS — H9190 Unspecified hearing loss, unspecified ear: Secondary | ICD-10-CM | POA: Diagnosis present

## 2022-05-04 DIAGNOSIS — I495 Sick sinus syndrome: Secondary | ICD-10-CM | POA: Diagnosis present

## 2022-05-04 DIAGNOSIS — I129 Hypertensive chronic kidney disease with stage 1 through stage 4 chronic kidney disease, or unspecified chronic kidney disease: Secondary | ICD-10-CM | POA: Diagnosis present

## 2022-05-04 DIAGNOSIS — I480 Paroxysmal atrial fibrillation: Secondary | ICD-10-CM

## 2022-05-04 DIAGNOSIS — R531 Weakness: Secondary | ICD-10-CM | POA: Diagnosis not present

## 2022-05-04 DIAGNOSIS — M1990 Unspecified osteoarthritis, unspecified site: Secondary | ICD-10-CM | POA: Diagnosis present

## 2022-05-04 DIAGNOSIS — I9589 Other hypotension: Secondary | ICD-10-CM | POA: Diagnosis present

## 2022-05-04 DIAGNOSIS — E039 Hypothyroidism, unspecified: Secondary | ICD-10-CM | POA: Diagnosis present

## 2022-05-04 DIAGNOSIS — N1832 Chronic kidney disease, stage 3b: Secondary | ICD-10-CM | POA: Diagnosis present

## 2022-05-04 DIAGNOSIS — G20A1 Parkinson's disease without dyskinesia, without mention of fluctuations: Secondary | ICD-10-CM | POA: Diagnosis present

## 2022-05-04 DIAGNOSIS — D631 Anemia in chronic kidney disease: Secondary | ICD-10-CM | POA: Diagnosis present

## 2022-05-04 DIAGNOSIS — Z789 Other specified health status: Secondary | ICD-10-CM

## 2022-05-04 DIAGNOSIS — I2510 Atherosclerotic heart disease of native coronary artery without angina pectoris: Secondary | ICD-10-CM | POA: Diagnosis present

## 2022-05-04 DIAGNOSIS — G8194 Hemiplegia, unspecified affecting left nondominant side: Secondary | ICD-10-CM | POA: Diagnosis present

## 2022-05-04 LAB — CBC
HCT: 36.9 % — ABNORMAL LOW (ref 39.0–52.0)
Hemoglobin: 12.2 g/dL — ABNORMAL LOW (ref 13.0–17.0)
MCH: 32.7 pg (ref 26.0–34.0)
MCHC: 33.1 g/dL (ref 30.0–36.0)
MCV: 98.9 fL (ref 80.0–100.0)
Platelets: 225 10*3/uL (ref 150–400)
RBC: 3.73 MIL/uL — ABNORMAL LOW (ref 4.22–5.81)
RDW: 14.5 % (ref 11.5–15.5)
WBC: 9.5 10*3/uL (ref 4.0–10.5)
nRBC: 0 % (ref 0.0–0.2)

## 2022-05-04 LAB — ECHOCARDIOGRAM COMPLETE BUBBLE STUDY
AR max vel: 1.36 cm2
AV Area VTI: 1.37 cm2
AV Area mean vel: 1.37 cm2
AV Mean grad: 8 mmHg
AV Peak grad: 13.1 mmHg
Ao pk vel: 1.81 m/s
Area-P 1/2: 4.49 cm2
Calc EF: 47.6 %
MV M vel: 3.85 m/s
MV Peak grad: 59.4 mmHg
S' Lateral: 3.3 cm
Single Plane A2C EF: 30.7 %
Single Plane A4C EF: 60.3 %

## 2022-05-04 LAB — MAGNESIUM: Magnesium: 2.2 mg/dL (ref 1.7–2.4)

## 2022-05-04 LAB — HEMOGLOBIN A1C
Hgb A1c MFr Bld: 5.1 % (ref 4.8–5.6)
Mean Plasma Glucose: 99.67 mg/dL

## 2022-05-04 MED ORDER — MIDODRINE HCL 5 MG PO TABS
5.0000 mg | ORAL_TABLET | Freq: Three times a day (TID) | ORAL | Status: DC
  Administered 2022-05-05 – 2022-05-09 (×14): 5 mg via ORAL
  Filled 2022-05-04 (×14): qty 1

## 2022-05-04 MED ORDER — PRAVASTATIN SODIUM 40 MG PO TABS
40.0000 mg | ORAL_TABLET | Freq: Every day | ORAL | Status: DC
  Administered 2022-05-04 – 2022-05-09 (×6): 40 mg via ORAL
  Filled 2022-05-04 (×6): qty 1

## 2022-05-04 MED ORDER — CALCIUM CARBONATE ANTACID 500 MG PO CHEW: 1.0000 | CHEWABLE_TABLET | Freq: Every day | ORAL | Status: DC | PRN

## 2022-05-04 MED ORDER — POLYETHYLENE GLYCOL 3350 17 G PO PACK: 17.0000 g | PACK | Freq: Every day | ORAL | Status: DC | PRN

## 2022-05-04 MED ORDER — VITAMIN B-12 1000 MCG PO TABS
1000.0000 ug | ORAL_TABLET | Freq: Every day | ORAL | Status: DC
  Administered 2022-05-04 – 2022-05-09 (×6): 1000 ug via ORAL
  Filled 2022-05-04 (×6): qty 1

## 2022-05-04 MED ORDER — APIXABAN 5 MG PO TABS
5.0000 mg | ORAL_TABLET | Freq: Two times a day (BID) | ORAL | Status: DC
  Administered 2022-05-04 – 2022-05-09 (×11): 5 mg via ORAL
  Filled 2022-05-04 (×11): qty 1

## 2022-05-04 MED ORDER — SODIUM CHLORIDE 0.9 % IV SOLN: INTRAVENOUS | Status: DC

## 2022-05-04 MED ORDER — ACETAMINOPHEN 325 MG PO TABS
650.0000 mg | ORAL_TABLET | Freq: Four times a day (QID) | ORAL | Status: DC | PRN
  Administered 2022-05-06: 650 mg via ORAL
  Filled 2022-05-04: qty 2

## 2022-05-04 MED ORDER — PROCHLORPERAZINE EDISYLATE 10 MG/2ML IJ SOLN
5.0000 mg | Freq: Four times a day (QID) | INTRAMUSCULAR | Status: DC | PRN
  Filled 2022-05-04: qty 2

## 2022-05-04 MED ORDER — MELATONIN 5 MG PO TABS: 5.0000 mg | ORAL_TABLET | Freq: Every evening | ORAL | Status: DC | PRN

## 2022-05-04 MED ORDER — CARBIDOPA-LEVODOPA 25-100 MG PO TABS
2.0000 | ORAL_TABLET | Freq: Three times a day (TID) | ORAL | Status: DC
  Administered 2022-05-04 – 2022-05-09 (×16): 2 via ORAL
  Filled 2022-05-04 (×16): qty 2

## 2022-05-04 MED ORDER — EZETIMIBE 10 MG PO TABS
10.0000 mg | ORAL_TABLET | Freq: Every day | ORAL | Status: DC
  Administered 2022-05-04 – 2022-05-09 (×6): 10 mg via ORAL
  Filled 2022-05-04 (×6): qty 1

## 2022-05-04 MED ORDER — LEVOTHYROXINE SODIUM 75 MCG PO TABS
75.0000 ug | ORAL_TABLET | Freq: Every day | ORAL | Status: DC
  Administered 2022-05-04 – 2022-05-09 (×6): 75 ug via ORAL
  Filled 2022-05-04 (×6): qty 1

## 2022-05-04 MED ORDER — LABETALOL HCL 5 MG/ML IV SOLN: 5.0000 mg | INTRAVENOUS | Status: DC | PRN

## 2022-05-04 MED ORDER — SODIUM CHLORIDE 0.9 % IV BOLUS
500.0000 mL | Freq: Once | INTRAVENOUS | Status: AC
  Administered 2022-05-04: 500 mL via INTRAVENOUS

## 2022-05-04 MED ORDER — ADULT MULTIVITAMIN W/MINERALS CH
1.0000 | ORAL_TABLET | Freq: Every day | ORAL | Status: DC
  Administered 2022-05-04 – 2022-05-09 (×6): 1 via ORAL
  Filled 2022-05-04 (×6): qty 1

## 2022-05-04 NOTE — Progress Notes (Signed)
Carotid duplex has been completed.   Preliminary results in CV Proc.   Jerry Wood 05/04/2022 10:34 AM

## 2022-05-04 NOTE — H&P (Addendum)
History and Physical  Jerry Wood Jerry Wood DOB: 05/27/1922 Jerry Wood  Referring physician: Dr. Reather Converse, [place]  PCP: Pcp, No  Outpatient Specialists: Cardiology. Patient coming from: Home, lives with his daughter and son-in-law.  Chief Complaint: Code stroke.  HPI: Jerry Wood is a 88 y.o. male with medical history significant for Parkinson's disease, permanent atrial fibrillation on Eliquis, hypertension, history of tachybradycardia syndrome status post pacemaker placement, heart coronary artery disease status post PCI to RCA, CKD 3B, nonambulatory, wheelchair dependent, who presented to [facility] ED from home via EMS as a code stroke.  The patient was noted to have a left facial droop and slurred speech at home.  Last known well around 4 PM yesterday.  Family also concerned for possible dysphagia to solids and liquids for the past few days.  In the ED, CT head nonacute, MRI brain pending, will need to be compatible with pacemaker.  Seen by neurology/stroke team.  Recommended admission for observation.  The patient was admitted by [facility], hospitalist service.  In the ED, facial droop is improved.  Minimally talkative at the time of the visit.  Accompanied in the room by his 2 daughters.  ED Course: BP 94/61, pulse 74, respiration rate 14, O2 saturation 94% on room air.  Review of Systems: Review of systems as noted in the HPI. All other systems reviewed and are negative.   Past Medical History:  Diagnosis Date   Allergic rhinitis    Cancer ([place]) 09/2015   skin ,basal cell   Chronic knee pain    Constipation    COPD, mild (HCC)    Coronary artery disease    s/p PCI of the RCA   Diverticulosis    Dyslipidemia    Gout    Hypertension    Long term (current) use of anticoagulants    Onychomycosis    Osteoarthrosis, unspecified whether generalized or localized, other specified sites    Has gait disorder secondary to DJD of lower extremities   Parkinson's disease ([place]) 03/2012    Dr Vilinda Blanks started   Permanent atrial fibrillation ([place])    Chronic. ECHO 06/04/11 LVEF estimated by 2D at 52.1%   Pure hypercholesterolemia    Seborrheic keratosis, inflamed    Skin lesion    Plan to remove left posterior ear lesion with electrocautery   Tachy-brady syndrome [facility])    s/p permanent pacemaker placement   Unspecified hypothyroidism    Vertigo    Vitamin D deficiency    Past Surgical History:  Procedure Laterality Date   BIV PACEMAKER GENERATOR CHANGEOUT N/A 03/13/2022   Procedure: BIV PACEMAKER GENERATOR CHANGEOUT;  Surgeon: Vickie Epley, MD;  Location: [place] CV [facility];  Service: Cardiovascular;  Laterality: N/A;   FRACTURE SURGERY Right    clavicle   INGUINAL HERNIA REPAIR Bilateral    LAPAROSCOPIC PARTIAL COLECTOMY     Partial resection of sigmoid colon   PACEMAKER PLACEMENT  2003   Tachybradycardia syndrome. Gen change 07/29/08, Dr. Leonia Reeves   TYMPANOPLASTY Bilateral     Social History:  reports that he quit smoking about 73 years ago. His smoking use included cigarettes. He has been exposed to tobacco smoke. He has never used smokeless tobacco. He reports that he does not drink alcohol and does not use drugs.   No Known Allergies  Family History  Problem Relation Age of Onset   CAD Father    Diabetes Father    Bladder Cancer Other    Leukemia Other  Prior to Admission medications   Medication Sig Start Date End Date Taking? Authorizing Provider  acetaminophen (TYLENOL) 500 MG tablet Take 1,000 mg by mouth every 6 (six) hours as needed for moderate pain.   Yes [provider]  apixaban (ELIQUIS) 5 MG TABS tablet TAKE 1 TABLET(5 MG) BY MOUTH TWICE DAILY Patient taking differently: Take 5 mg by mouth 2 (two) times daily. 04/27/22  Yes Camnitz, Ocie Doyne, MD  calcium carbonate (TUMS - DOSED IN MG ELEMENTAL CALCIUM) 500 MG chewable tablet Chew 1 tablet by mouth daily as needed for indigestion or heartburn.   Yes [provider]  carbidopa-levodopa (SINEMET IR) 25-100 MG tablet TAKE 2 TABLETS BY MOUTH THREE TIMES DAILY Patient taking differently: Take 2 tablets by mouth 3 (three) times daily. 10/10/21  Yes Penumalli, [name], MD  diltiazem (CARDIZEM CD) 120 MG 24 hr capsule TAKE 1 CAPSULE(120 MG) BY MOUTH DAILY Patient taking differently: Take 120 mg by mouth daily. 10/17/21  Yes Allred, Jeneen Rinks, MD  ezetimibe (ZETIA) 10 MG tablet TAKE 1 TABLET(10 MG) BY MOUTH DAILY Patient taking differently: Take 10 mg by mouth daily. 02/13/22  Yes Turner, Eber Hong, MD  furosemide (LASIX) 80 MG tablet Take 80 mg by mouth 2 (two) times daily.    Yes [provider]  levothyroxine (SYNTHROID, LEVOTHROID) 75 MCG tablet Take 75 mcg by mouth daily before breakfast.  11/15/12  Yes [provider]  loratadine (CLARITIN) 10 MG tablet Take 10 mg by mouth daily.   Yes [provider]  losartan (COZAAR) 50 MG tablet TAKE 1 TABLET(50 MG) BY MOUTH DAILY Patient taking differently: Take 50 mg by mouth daily. 02/13/22  Yes Turner, Eber Hong, MD  Multiple Vitamin (MULTIVITAMIN) capsule Take 1 capsule by mouth daily.   Yes [provider]  Multiple Vitamins-Minerals (PRESERVISION AREDS) CAPS Take 1 capsule by mouth daily.    Yes [provider]  potassium chloride SA (K-DUR,KLOR-CON) 20 MEQ tablet Take 30 mEq by mouth daily.   Yes [provider]  pravastatin (PRAVACHOL) 40 MG tablet TAKE 1 TABLET BY MOUTH EVERY DAY Patient taking differently: Take 40 mg by mouth every evening. 05/27/18  Yes Turner, Eber Hong, MD  senna-docusate (COLACE 2-IN-1) 8.6-50 MG tablet Take 1 tablet by mouth daily.   Yes [provider]  vitamin B-12 (CYANOCOBALAMIN) 1000 MCG tablet Take 1,000 mcg by mouth daily.   Yes [provider]  imiquimod (ALDARA) 5 % cream Apply 1 Application topically daily. Patient not taking: Reported on Wood 02/06/22   [provider]    Physical Exam: BP  110/66   Pulse 87   Temp 98.4 F (36.9 C) (Oral)   Resp 16   Wt 71.7 kg   SpO2 99%   BMI 29.87 kg/m   General: 88 y.o. year-old male well developed well nourished in no acute distress.  Alert and minimally interactive. Cardiovascular: Irregular rate and rhythm with no rubs or gallops.  No thyromegaly or JVD noted.  Trace lower extremity edema. 2/4 pulses in all 4 extremities. Respiratory: Clear to auscultation with no wheezes or rales. Good inspiratory effort. Abdomen: Soft nontender non-distended with normal bowel sounds x4 quadrants. Muskuloskeletal: No cyanosis or clubbing.  Trace lower extremity edema noted bilaterally Neuro: CN II-XII intact, strength, sensation, reflexes Skin: No ulcerative lesions noted or rashes Psychiatry: Unable to assess judgment and mood due to minimal interaction.         Labs on Admission:  Basic Metabolic Panel: Recent  Labs  Lab 05/03/22 1721 05/03/22 1725  NA 141 141  K 3.8 3.8  CL 103 102  CO2 26  --   GLUCOSE 109* 105*  BUN 24* 25*  CREATININE 1.36* 1.40*  CALCIUM 9.1  --    Liver Function Tests: Recent Labs  Lab 05/03/22 1721  AST 22  ALT 6  ALKPHOS 108  BILITOT 0.7  PROT 6.5  ALBUMIN 3.6   No results for input(s): "LIPASE", "AMYLASE" in the last 168 hours. No results for input(s): "AMMONIA" in the last 168 hours. CBC: Recent Labs  Lab 05/03/22 1721 05/03/22 1725  WBC 10.2  --   NEUTROABS 6.9  --   HGB 12.3* 12.6*  HCT 37.9* 37.0*  MCV 101.1*  --   PLT 248  --    Cardiac Enzymes: No results for input(s): "CKTOTAL", "CKMB", "CKMBINDEX", "TROPONINI" in the last 168 hours.  BNP (last 3 results) No results for input(s): "BNP" in the last 8760 hours.  ProBNP (last 3 results) No results for input(s): "PROBNP" in the last 8760 hours.  CBG: Recent Labs  Lab 05/03/22 1721  GLUCAP 108*    Radiological Exams on Admission: DG Chest 1 View  Result Date: Wood CLINICAL DATA:  [id number] Pneumonia [id number] EXAM: CHEST   1 VIEW COMPARISON:  Chest x-ray 01/26/2015 FINDINGS: Left chest wall 2 lead pacemaker in grossly appropriate position. The heart and mediastinal contours are unchanged. No focal consolidation. No pulmonary edema. No pleural effusion. No pneumothorax. No acute osseous abnormality. IMPRESSION: No active disease. Electronically Signed   By: Iven Finn M.D.   On: Wood 18:20   CT HEAD CODE STROKE WO CONTRAST  Result Date: Wood CLINICAL DATA:  Code stroke. EXAM: CT HEAD WITHOUT CONTRAST TECHNIQUE: Contiguous axial images were obtained from the base of the skull through the vertex without intravenous contrast. RADIATION DOSE REDUCTION: This exam was performed according to the departmental dose-optimization program which includes automated exposure control, adjustment of the mA and/or kV according to patient size and/or use of iterative reconstruction technique. COMPARISON:  CT head 07/21/2012 FINDINGS: Brain: There is no acute intracranial hemorrhage, extra-axial fluid collection, or acute infarct. Parenchymal volume is normal for age. The ventricles are normal in size. Gray-white differentiation is preserved. There is a small remote infarct in the right caudate head and small remote infarcts in the bilateral cerebellar hemispheres, unchanged since 2014. There is no mass lesion.  There is no mass effect or midline shift. Vascular: No dense vessel is seen. There is calcification of the bilateral carotid siphons. Skull: Normal. Negative for fracture or focal lesion. Sinuses/Orbits: The paranasal sinuses are clear. Bilateral lens implants are in place. The globes and orbits are otherwise unremarkable. Other: None. ASPECTS [facility] Stroke Program Early CT Score) - Ganglionic level infarction (caudate, lentiform nuclei, internal capsule, insula, M1-M3 cortex): 7 - Supraganglionic infarction (M4-M6 cortex): 3 Total score (0-10 with 10 being normal): 10 IMPRESSION: 1. No acute intracranial pathology. 2.  ASPECTS is 10 Findings communicated to Dr. Rory Percy at 5:38 p.m. Electronically Signed   By: Valetta Mole M.D.   On: Wood 17:42    EKG: I independently viewed the EKG done and my findings are as followed: Atrial fibrillation rate of 86.  Nonspecific ST-T changes.  QTc 442.  Assessment/Plan Present on Admission: **None**  Principal Problem:   Left-sided weakness  Left facial droop/left-sided weakness, POA Concern for TIA/CVA CT head nonacute, follow MRI brain. Fast lipid panel, A1c PT/OT/speech therapist evaluation Permissive hypertension until stroke  is ruled out N.p.o. until passes swallow evaluation IV labetalol as needed with parameters. Frequent neurochecks.  Permanent atrial fibrillation on Eliquis Resume home Eliquis Monitor on telemetry  Hyperlipidemia Resume home regimen  Hypothyroidism Resume home regimen  Parkinson's disease Resume home regimen  CKD 3B At baseline. Avoid nephrotoxic agent.  Avoid dehydration and hypotension.   DVT prophylaxis: Home Eliquis.  Code Status: Full code  Family Communication: 2 daughters at bedside.  Disposition Plan: Admitted to telemetry medical unit  Consults called: None.  Admission status: Observation status   Status is: Observation    Kayleen Memos MD [facility] Pager [phone]  If 7PM-7AM, please contact night-coverage [email or web address] Password [email or web address]  05/04/2022, 2:48 AM

## 2022-05-04 NOTE — ED Notes (Addendum)
Primofit removed by pt. Wet linens + brief changed, peri care performed

## 2022-05-04 NOTE — Progress Notes (Addendum)
Tele called to say pt hr is going up into the 140s. We just had to put mittens on him because he is restless in bed and pulling, tugging, moving about the bed, removing his tele, trying to remove IV. Denies pain or discomfort.  Michela Pitcher he has to go home.  Is confused.  Paged MD to see if he can get something to help calm him down, as all interventions so far have failed.

## 2022-05-04 NOTE — Progress Notes (Signed)
  Echocardiogram 2D Echocardiogram has been performed.  Lana Fish 05/04/2022, 10:23 AM

## 2022-05-04 NOTE — Evaluation (Signed)
Clinical/Bedside Swallow Evaluation Patient Details  Name: Jerry Wood MRN: [id number] Date of Birth: [birth year]  Today's Date: 05/04/2022 Time: SLP Start Time (ACUTE ONLY): 73 SLP Stop Time (ACUTE ONLY): 1054 SLP Time Calculation (min) (ACUTE ONLY): 11 min  Past Medical History:  Past Medical History:  Diagnosis Date   Allergic rhinitis    Cancer ([place]) 09/2015   skin ,basal cell   Chronic knee pain    Constipation    COPD, mild ([place])    Coronary artery disease    s/p PCI of the RCA   Diverticulosis    Dyslipidemia    Gout    Hypertension    Long term (current) use of anticoagulants    Onychomycosis    Osteoarthrosis, unspecified whether generalized or localized, other specified sites    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 ECHO 06/04/11 LVEF estimated by 2D at 52.1%   Pure hypercholesterolemia    Seborrheic keratosis, inflamed    Skin lesion    Plan to remove left posterior ear lesion with electrocautery   Tachy-brady syndrome [facility])    s/p permanent pacemaker placement   Unspecified hypothyroidism    Vertigo    Vitamin D deficiency    Past Surgical History:  Past Surgical History:  Procedure Laterality Date   BIV PACEMAKER GENERATOR CHANGEOUT N/A 03/13/2022   Procedure: BIV PACEMAKER GENERATOR CHANGEOUT;  Surgeon: Vickie Epley, MD;  Location: [facility];  Service: Cardiovascular;  Laterality: N/A;   FRACTURE SURGERY Right    clavicle   INGUINAL HERNIA REPAIR Bilateral    LAPAROSCOPIC PARTIAL COLECTOMY     Partial resection of sigmoid colon   PACEMAKER PLACEMENT  2003   Tachybradycardia syndrome. Gen change 07/29/08, Dr. Leonia Reeves   TYMPANOPLASTY Bilateral    HPI:  Pt is a 88 y/o male who presented with L facial droop and slurred speech. CT head negative, MRI brain pending pacemaker compatibility. PMH:  Parkinson's, a fib, HTN, PPM, CAD, CKD3    Assessment / Plan / Recommendation  Clinical Impression  Pt alert, following commands and accepting of po's. His facial and lingual ROM were grossly within normal limits and natural dentition is intact with strong volitional cough. Given sips of water via straw he had a consistent immediate cough suggestive of reduced airway protection. Straw removed and was able to coordinate swallow and respirations without signs aspiration consistently throughout evaluation. Oral control, mastication and transit with solids was adequate. Daughter denied swallow difficulties prior to admission and yesterday reported liquid spilled from oral cavity when cup attempted. Recommend continue regular texture, thin liquids, NO straws and meds whole in puree. Continued intervention for dysphagia strategies and safety and need for objective testing. Daughter in agreement with plan. SLP Visit Diagnosis: Dysphagia, unspecified (R13.10)    Aspiration Risk  Mild aspiration risk    Diet Recommendation Regular;Thin liquid   Liquid Administration via: Cup;No straw Medication Administration: Whole meds with puree Supervision: Staff to assist with self feeding;Full supervision/cueing for compensatory strategies Compensations: Slow rate;Small sips/bites Postural Changes: Seated upright at 90 degrees    Other  Recommendations Oral Care Recommendations: Oral care BID    Recommendations for follow up therapy are one component of a multi-disciplinary discharge planning process, led by the attending physician.  Recommendations may be updated based on patient status, additional functional criteria and insurance authorization.  Follow  up Recommendations  (TBD)      Assistance Recommended at Discharge    Functional Status Assessment Patient has had a recent decline in their functional status and demonstrates the ability to make significant improvements in function in a reasonable and  predictable amount of time.  Frequency and Duration min 2x/week  2 weeks       Prognosis Prognosis for Safe Diet Advancement: Good      Swallow Study   General Date of Onset: 05/03/22 HPI: Pt is a 88 y/o male who presented with L facial droop and slurred speech. CT head negative, MRI brain pending pacemaker compatibility. PMH: Parkinson's, a fib, HTN, PPM, CAD, CKD3 Type of Study: Bedside Swallow Evaluation Previous Swallow Assessment:  (none) Diet Prior to this Study: Regular;Thin liquids Temperature Spikes Noted: No Respiratory Status: Room air History of Recent Intubation: No Behavior/Cognition: Alert;Cooperative;Pleasant mood Oral Cavity Assessment: Within Functional Limits Oral Care Completed by SLP: No Oral Cavity - Dentition: Adequate natural dentition Vision: Functional for self-feeding Self-Feeding Abilities: Able to feed self Patient Positioning: Upright in bed Baseline Vocal Quality: Normal Volitional Cough: Strong Volitional Swallow: Able to elicit    Oral/Motor/Sensory Function Overall Oral Motor/Sensory Function: Within functional limits   Ice Chips Ice chips: Not tested   Thin Liquid Thin Liquid: Impaired Presentation: Cup;Straw Pharyngeal  Phase Impairments: Cough - Immediate    Nectar Thick Nectar Thick Liquid: Not tested   Honey Thick Honey Thick Liquid: Not tested   Puree Puree: Within functional limits   Solid     Solid: Within functional limits      Houston Siren 05/04/2022,11:27 AM

## 2022-05-04 NOTE — ED Notes (Signed)
Chen MD paged regarding pts BP 99/50 (67)

## 2022-05-04 NOTE — Consult Note (Signed)
Consultation Note Date: 05/04/2022   Patient Name: Jerry Wood  DOB: 22-Oct-1921  MRN: [id number]  Age / Sex: 88 y.o., male  PCP: Pcp, No Referring Physician: Aline August, MD  Reason for Consultation: Establishing goals of care  HPI/Patient Profile: 87 y.o. male  with past medical history of Parkinson's disease, permanent atrial fibrillation on Eliquis, hypertension, history of tachybradycardia syndrome status post pacemaker placement, heart coronary artery disease status post PCI to RCA, and CKD 3B presented to ED on 05/03/22 from home with left facial droop and left sided weakness. Patient was admitted on 05/03/2022 with left facial droop/left sided weakness with concern for TIA/CVA. CT head was non-acute.  Clinical Assessment and Goals of Care: I have reviewed medical records including EPIC notes, labs, and imaging. Received report from primary RN accepting patient on 3W - no acute concerns from ED report.    Went to visit patient at bedside. Patient not in room - he is current getting MRI per RN.   Called patient's daughter/Dawn - she is in MRI waiting room. Will plan to meet patient and Dawn when he arrives to 3W unit after MRI.  2:30 PM Patient back from MRI.   Went to visit patient at bedside - daughter/Dawn present. Patient was lying in bed awake, alert, and able to participate in simple conversation; however, he is very hard of hearting. No signs or non-verbal gestures of pain or discomfort noted. No respiratory distress, increased work of breathing, or secretions noted. He denies pain, wants to order food. Primary RN assisted in ordering.  Met with Dawn in 3W conference room  to discuss diagnosis, prognosis, [place], EOL wishes, disposition, and options.  I introduced Palliative Medicine as specialized medical care for people living with serious illness. It focuses on providing relief from the symptoms  and stress of a serious illness. The goal is to improve quality of life for both the patient and the family.  We discussed a brief life review of the patient as well as functional and nutritional status. Patient's wife has passed - they have two daughters. Prior to hospitalization, patient's daughter/Dawn and her husband was living with him in his private residence. Dawn is his primary caregiver - they do not have any [place] services. Patient is able to pivot for transfers, he uses an electric wheelchair, is able to dress/bathe himself with some assistance. Dawn tells me he is "very proud" and "likes to be independent." He had a good appetite at home - albumin noted at 3.6 on 05/03/22.  We discussed patient's current illness and what it means in the larger context of patient's on-going co-morbidities. Dawn understands that COPD and Parkinson's are progressive, non-curable disease underlying the patient's current acute medical conditions. She has a clear understanding of his current acute medical situation. Natural disease trajectory and expectations at EOL were discussed. I attempted to elicit values and goals of care important to the patient. The difference between aggressive medical intervention and comfort care was considered in light of the patient's goals of care.   Reviewed recent MOST form - Dawn feels patient would want to be rehospitalized if he had another decline per MOST, would want to treat the treatable. Reviewed that as people's health changes over time, people often change their minds on what they are willing to go through based on quality of life - she expressed understanding. She confirms his DNR/DNI status. Briefly reviewed option of hospice care vs rehospitalization. Dawn would like her sister present for further discussions  for discharge options.   Dawn tells me that she doesn't want the patient discharged until "he's back to where he was before." Counseled on the importance of anticipatory  care planning as patient may not return to his previous baseline. If patient does not, Dawn may not be able to continue caring for him at home. Patient has not wanted to work with PT in the past - he has worked with them during their visits, but did not want to do exercises outside of these visits. Encouraged Dawn to consider this when deciding on continuing any physical therapies at discharge.  Advance directives, concepts specific to code status, artificial feeding and hydration, and rehospitalization were considered and discussed. Patient does not have a living will or HCPOA - both of his daughters work together to make the best decisions on his behalf.  Discussed with Dawn the importance of continued conversation with each other and the medical providers regarding overall plan of care and treatment options, ensuring decisions are within the context of the patient's values and GOCs.    Questions and concerns were addressed. The patient/family was encouraged to call with questions and/or concerns. PMT card was provided.   Primary Decision Maker: NEXT OF KIN - patient's two daughters work together    SUMMARY OF [place] current supportive treatment Now DNR/DNI - durable DNR form completed and placed in shadow chart. Copy was made and will be scanned into Vynca/ACP tab Patient's daughter would like her sister present for continued GOC around discharge options - she will call PMT with preferred time for meeting tomorrow 11/18 PMT will continue to follow and support holistically    Code Status/Advance Care Planning: DNR  Palliative Prophylaxis:  Aspiration, Bowel Regimen, Delirium Protocol, Frequent Pain Assessment, Oral Care, and Turn Reposition  Additional Recommendations (Limitations, Scope, Preferences): Full Scope Treatment, No Artificial Feeding, and No Tracheostomy  Psycho-social/Spiritual:  Desire for further Chaplaincy support:no Created space and opportunity  for patient and family to express thoughts and feelings regarding patient's current medical situation.  Emotional support and therapeutic listening provided.  Prognosis:  Unable to determine  Discharge Planning: To Be Determined      Primary Diagnoses: Present on Admission:  TIA (transient ischemic attack)   I have reviewed the medical record, interviewed the patient and family, and examined the patient. The following aspects are pertinent.  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 ECHO 06/04/11 LVEF estimated by 2D at 52.1%   Pure hypercholesterolemia    Seborrheic keratosis, inflamed    Skin lesion    Plan to remove left posterior ear lesion with electrocautery   Tachy-brady syndrome [facility])    s/p permanent pacemaker placement   Unspecified hypothyroidism    Vertigo    Vitamin D deficiency    Social History   Socioeconomic History  Marital status: Widowed    Spouse name: Not on file   Number of children: 2   Years of education: 7TH   Highest education level: Not on file  Occupational History   Not on file  Tobacco Use   Smoking status: Former    Years: 10.00    Types: Cigarettes    Quit date: 06/18/1948    Years since quitting: 73.9    Passive exposure: Past   Smokeless tobacco: Never  Vaping Use   Vaping Use: Never used  Substance and Sexual Activity   Alcohol use: No   Drug use: No   Sexual activity: Not on  file  Other Topics Concern   Not on file  Social History Narrative   03/14/20 Patient lives at home with family.   Caffeine Use: rarely   Social Determinants of Radio broadcast assistant Strain: Not on file  Food Insecurity: Not on file  Transportation Needs: Not on file  Physical Activity: Not on file  Stress: Not on file  Social Connections: Not on file   Family History  Problem Relation Age of Onset   CAD Father    Diabetes Father    Bladder Cancer Other    Leukemia Other    Scheduled Meds:  apixaban  5 mg Oral BID   carbidopa-levodopa  2 tablet Oral TID WC   cyanocobalamin  1,000 mcg Oral Daily   ezetimibe  10 mg Oral Daily   levothyroxine  75 mcg Oral QAC breakfast   multivitamin with minerals  1 tablet Oral Daily   pravastatin  40 mg Oral Daily   Continuous Infusions:  sodium chloride 75 mL/hr at 05/04/22 0749   PRN Meds:.acetaminophen, calcium carbonate, labetalol, melatonin, polyethylene glycol, prochlorperazine Medications Prior to Admission:  Prior to Admission medications   Medication Sig Start Date End Date Taking? Authorizing Provider  acetaminophen (TYLENOL) 500 MG tablet Take 1,000 mg by mouth every 6 (six) hours as needed for moderate pain.   Yes [provider]  apixaban (ELIQUIS) 5 MG TABS tablet TAKE 1 TABLET(5 MG) BY MOUTH TWICE DAILY Patient taking differently: Take 5 mg by mouth 2 (two) times daily. 04/27/22  Yes Camnitz, Ocie Doyne, MD  calcium carbonate (TUMS - DOSED IN MG ELEMENTAL CALCIUM) 500 MG chewable tablet Chew 1 tablet by mouth daily as needed for indigestion or heartburn.   Yes [provider]  carbidopa-levodopa (SINEMET IR) 25-100 MG tablet TAKE 2 TABLETS BY MOUTH THREE TIMES DAILY Patient taking differently: Take 2 tablets by mouth 3 (three) times daily. 10/10/21  Yes Penumalli, [name], MD  diltiazem (CARDIZEM CD) 120 MG 24 hr capsule TAKE 1 CAPSULE(120 MG) BY MOUTH DAILY Patient taking differently: Take 120 mg by  mouth daily. 10/17/21  Yes Allred, Jeneen Rinks, MD  ezetimibe (ZETIA) 10 MG tablet TAKE 1 TABLET(10 MG) BY MOUTH DAILY Patient taking differently: Take 10 mg by mouth daily. 02/13/22  Yes Turner, Eber Hong, MD  furosemide (LASIX) 80 MG tablet Take 80 mg by mouth 2 (two) times daily.    Yes [provider]  levothyroxine (SYNTHROID, LEVOTHROID) 75 MCG tablet Take 75 mcg by mouth daily before breakfast.  11/15/12  Yes [provider]  loratadine (CLARITIN) 10 MG tablet Take 10 mg by mouth daily.   Yes [provider]  losartan (COZAAR) 50 MG tablet TAKE 1 TABLET(50 MG) BY MOUTH DAILY Patient taking differently: Take 50 mg by mouth daily. 02/13/22  Yes Sueanne Margarita, MD  Multiple  Vitamin (MULTIVITAMIN) capsule Take 1 capsule by mouth daily.   Yes [provider]  Multiple Vitamins-Minerals (PRESERVISION AREDS) CAPS Take 1 capsule by mouth daily.    Yes [provider]  potassium chloride SA (K-DUR,KLOR-CON) 20 MEQ tablet Take 30 mEq by mouth daily.   Yes [provider]  pravastatin (PRAVACHOL) 40 MG tablet TAKE 1 TABLET BY MOUTH EVERY DAY Patient taking differently: Take 40 mg by mouth every evening. 05/27/18  Yes Turner, Eber Hong, MD  senna-docusate (COLACE 2-IN-1) 8.6-50 MG tablet Take 1 tablet by mouth daily.   Yes [provider]  vitamin B-12 (CYANOCOBALAMIN) 1000 MCG tablet Take 1,000 mcg by mouth daily.   Yes [provider]  imiquimod (ALDARA) 5 % cream Apply 1 Application topically daily. Patient not taking: Reported on 05/03/2022 02/06/22   [provider]   No Known Allergies Review of Systems  Constitutional:  Positive for activity change and fatigue.  Respiratory:  Negative for shortness of breath.   Gastrointestinal:  Negative for nausea.  Neurological:  Positive for weakness.  All other systems reviewed and are negative.   Physical Exam Vitals and nursing note reviewed.  Constitutional:      General: He is  not in acute distress. Pulmonary:     Effort: No respiratory distress.  Skin:    General: Skin is warm and dry.  Neurological:     Mental Status: He is alert.     Motor: Weakness present.  Psychiatric:        Attention and Perception: Attention normal.        Behavior: Behavior is cooperative.        Cognition and Memory: Cognition and memory normal.     Vital Signs: BP (!) 98/53   Pulse 73   Temp 98 F (36.7 C) (Oral)   Resp 15   Wt 71.7 kg   SpO2 97%   BMI 29.87 kg/m  Pain Scale: 0-10   Pain Score: 0-No pain   SpO2: SpO2: 97 % O2 Device:SpO2: 97 % O2 Flow Rate: .   IO: Intake/output summary:  Intake/Output Summary (Last 24 hours) at 05/04/2022 1402 Last data filed at 05/04/2022 0830 Gross per 24 hour  Intake 500 ml  Output 510 ml  Net -10 ml    LBM: Last BM Date : 05/03/22 Baseline Weight: Weight: 71.7 kg Most recent weight: Weight: 71.7 kg     Palliative Assessment/Data: PPS 50%     Time In: 1400  Time Out: 1530 Time Total: 90 minutes  Greater than 50%  of this time was spent counseling and coordinating care related to the above assessment and plan.  Signed by: Lin Landsman, NP   Please contact Palliative Medicine Team phone at [phone] for questions and concerns.  For individual provider: See Amion  *Portions of this note are a verbal dictation therefore any spelling and/or grammatical errors are due to the "[place] [employer]" system interpretation.

## 2022-05-04 NOTE — Evaluation (Signed)
Physical Therapy Evaluation Patient Details Name: Jerry Wood MRN: [id number] DOB: 02-Feb-1922 Today's Date: 05/04/2022  History of Present Illness  Pt is a 88 y/o male who presented with L facial droop and slurred speech. CT head negative, MRI brain pending pacemaker compatibility. PMH: Parkinson's, a fib, HTN, PPM, CAD, CKD3  Clinical Impression  Pt admitted with above diagnosis. Pt was unable to do more than sit EOB with drop in BP with dizziness reported. Pt does not ambulate at home per daughter and performs a scoot/squat pivot.  Is most likely close to baselin and should progress to HHPT with daughter's assist. Will follow acutely.  Pt currently with functional limitations due to the deficits listed below (see PT Problem List). Pt will benefit from skilled PT to increase their independence and safety with mobility to allow discharge to the venue listed below.          Recommendations for follow up therapy are one component of a multi-disciplinary discharge planning process, led by the attending physician.  Recommendations may be updated based on patient status, additional functional criteria and insurance authorization.  Follow Up Recommendations Home health PT      Assistance Recommended at Discharge Frequent or constant Supervision/Assistance  Patient can return home with the following  A lot of help with walking and/or transfers;A lot of help with bathing/dressing/bathroom;Assistance with cooking/housework;Assist for transportation;Help with stairs or ramp for entrance    Equipment Recommendations None recommended by PT  Recommendations for Other Services       Functional Status Assessment Patient has had a recent decline in their functional status and demonstrates the ability to make significant improvements in function in a reasonable and predictable amount of time.     Precautions / Restrictions Precautions Precautions: Fall;Other (comment) Precaution Comments: impaired L  shoulder ROM Restrictions Weight Bearing Restrictions: No      Mobility  Bed Mobility Overal bed mobility: Needs Assistance Bed Mobility: Supine to Sit, Sit to Supine     Supine to sit: Mod assist, HOB elevated Sit to supine: Max assist, +2 for safety/equipment   General bed mobility comments: Limited due to stretcher, requiring assist to move LEs and lift trunk. Due to high stretcher height and pt short stature, +2 provided for safety back to bed due to pt difficulty scooting back.  Pt initiated movement but could not complete movement without assist.  BP dropped and pt dizzy upon sitting therefore laid pt back down. Also had to notify nurse that IV was leaking.    Transfers                        Ambulation/Gait                  Stairs            Wheelchair Mobility    Modified Rankin (Stroke Patients Only)       Balance Overall balance assessment: Needs assistance Sitting-balance support: Bilateral upper extremity supported, Feet unsupported Sitting balance-Leahy Scale: Fair                                       Pertinent Vitals/Pain Pain Assessment Pain Assessment: No/denies pain    Home Living Family/patient expects to be discharged to:: Private residence Living Arrangements: Children Available Help at Discharge: Family;Available PRN/intermittently Type of Home: House Home Access: Ramped entrance  Home Layout: One level Home Equipment: BSC/3in1;Wheelchair - power;Wheelchair - manual Additional Comments: w/c does not fit in bathroom    Prior Function Prior Level of Function : Needs assist             Mobility Comments: uses w/c for mobility around the house, reports able to stand and get into chair w/o issue ADLs Comments: Setup for sponge bathing tasks as w/c will not fit in bathroom, uses BSC for toileting tasks. able to dress self. Daughter does IADLs     Hand Dominance   Dominant Hand: Right     Extremity/Trunk Assessment   Upper Extremity Assessment Upper Extremity Assessment: Defer to OT evaluation    Lower Extremity Assessment Lower Extremity Assessment: RLE deficits/detail;LLE deficits/detail RLE Deficits / Details: Pt keeps LES in slight frog leg position, grossly 2/5 strength LLE Deficits / Details: Pt keeps LES in slight frog leg position, grossly 2/5 strength    Cervical / Trunk Assessment Cervical / Trunk Assessment: Kyphotic  Communication   Communication: HOH  Cognition Arousal/Alertness: Awake/alert Behavior During Therapy: WFL for tasks assessed/performed Overall Cognitive Status: Within Functional Limits for tasks assessed                                 General Comments: pleasant, follows commands, limited due to [facility] at times but overall [facility]        General Comments General comments (skin integrity, edema, etc.): 78 bpm, 90/57 supine; 62/53 at EOB 78 bppm; 100/68 in supine, 81/53 once back in bed    Exercises     Assessment/Plan    PT Assessment Patient needs continued PT services  PT Problem List Decreased activity tolerance;Decreased balance;Decreased mobility;Decreased strength;Decreased range of motion;Decreased knowledge of use of DME;Decreased safety awareness;Decreased knowledge of precautions       PT Treatment Interventions DME instruction;Functional mobility training;Therapeutic activities;Therapeutic exercise;Balance training;Patient/family education    PT Goals (Current goals can be found in the Care Plan section)  Acute Rehab PT Goals Patient Stated Goal: to go home PT Goal Formulation: With patient Time For Goal Achievement: 05/18/22 Potential to Achieve Goals: Good    Frequency Min 3X/week     Co-evaluation               AM-PAC PT "6 Clicks" Mobility  Outcome Measure Help needed turning from your back to your side while in a flat bed without using bedrails?: A Lot Help needed moving from lying on your  back to sitting on the side of a flat bed without using bedrails?: A Lot Help needed moving to and from a bed to a chair (including a wheelchair)?: Total Help needed standing up from a chair using your arms (e.g., wheelchair or bedside chair)?: Total Help needed to walk in hospital room?: Total Help needed climbing 3-5 steps with a railing? : Total 6 Click Score: 8    End of Session Equipment Utilized During Treatment: Gait belt Activity Tolerance: Patient limited by fatigue (BP low) Patient left: with call bell/phone within reach;with family/visitor present (on stretcher) Nurse Communication: Mobility status PT Visit Diagnosis: Unsteadiness on feet (R26.81);Muscle weakness (generalized) (M62.81);Dizziness and giddiness (R42)    Time: 2376-2831 PT Time Calculation (min) (ACUTE ONLY): 18 min   Charges:   PT Evaluation $PT Eval Moderate Complexity: 1 Mod          Kalla Watson M,PT Acute Rehab Services [phone]   Alvira Philips 05/04/2022, 4:12 PM

## 2022-05-04 NOTE — Evaluation (Signed)
Occupational Therapy Evaluation Patient Details Name: Jerry Wood MRN: [id number] DOB: [birth year] Today's Date: 05/04/2022   History of Present Illness Pt is a 88 y/o male who presented with L facial droop and slurred speech. CT head negative, MRI brain pending pacemaker compatibility. PMH: Parkinson's, a fib, HTN, PPM, CAD, CKD3   Clinical Impression   PTA, pt lives with daughter, typically able to manage transfers without assist, uses power wheelchair for mobility, and Setup for ADLs. Pt presents now fairly close to baseline though requiring increased assist due to stretcher setup. Pt overall Mod-Max A for bed mobility on stretcher, able to stand with min guard though difficulty taking steps. Pt requires Setup for UB ADL and Min-Mod A for LB ADLs. Discussed w/ daughter via phone after evaluation and confirms she is able to provide light physical assistance prn and interested in [facility] therapy services to maximize independence/safety at home. Will continue to follow while admitted.   BP supine: 98/59 BP sitting: 104/74 BP standing: 116/94      Recommendations for follow up therapy are one component of a multi-disciplinary discharge planning process, led by the attending physician.  Recommendations may be updated based on patient status, additional functional criteria and insurance authorization.   Follow Up Recommendations  Home health OT     Assistance Recommended at Discharge Frequent or constant Supervision/Assistance  Patient can return home with the following A little help with walking and/or transfers;A little help with bathing/dressing/bathroom    Functional Status Assessment  Patient has had a recent decline in their functional status and demonstrates the ability to make significant improvements in function in a reasonable and predictable amount of time.  Equipment Recommendations  None recommended by OT    Recommendations for Other Services       Precautions /  Restrictions Precautions Precautions: Fall;Other (comment) Precaution Comments: impaired L shoulder ROM Restrictions Weight Bearing Restrictions: No      Mobility Bed Mobility Overal bed mobility: Needs Assistance Bed Mobility: Supine to Sit, Sit to Supine     Supine to sit: Mod assist, HOB elevated Sit to supine: Max assist, +2 for safety/equipment   General bed mobility comments: Limited due to stretcher, requiring assist to move LEs and lift trunk. Due to high stretcher height and pt short stature, +2 provided for safety back to bed due to pt difficulty scooting back    Transfers Overall transfer level: Needs assistance Equipment used: Rolling walker (2 wheels) Transfers: Sit to/from Stand Sit to Stand: Min guard           General transfer comment: able to rise from stretcher without assistance, noted crepitation throughout joints in standing      Balance Overall balance assessment: Needs assistance Sitting-balance support: Bilateral upper extremity supported, Feet unsupported Sitting balance-Leahy Scale: Fair     Standing balance support: Bilateral upper extremity supported, During functional activity Standing balance-Leahy Scale: Poor                             ADL either performed or assessed with clinical judgement   ADL Overall ADL's : Needs assistance/impaired Eating/Feeding: Set up   Grooming: Set up;Sitting   Upper Body Bathing: Set up;Sitting   Lower Body Bathing: Moderate assistance;Sit to/from stand   Upper Body Dressing : Set up;Sitting   Lower Body Dressing: Moderate assistance;Sitting/lateral leans;Sit to/from stand   Toilet Transfer: Minimal assistance;Stand-pivot   Toileting- Clothing Manipulation and Hygiene: Minimal assistance;Sitting/lateral lean;Sit to/from  stand         General ADL Comments: Limited by stretcher in ED, fairly close to baseline though noted narrow BOS and difficulty stepping hindering LB ADL  independence. Denies any new L sided concerns     Vision Baseline Vision/History: 1 Wears glasses Ability to See in Adequate Light: 0 Adequate Patient Visual Report: No change from baseline Vision Assessment?: No apparent visual deficits     Perception     Praxis      Pertinent Vitals/Pain Pain Assessment Pain Assessment: No/denies pain     Hand Dominance Right   Extremity/Trunk Assessment Upper Extremity Assessment Upper Extremity Assessment: LUE deficits/detail LUE Deficits / Details: grasp fair, biceps/triceps 4-/5, L shoulder ROM impaired to apporx 20* actively, reports hx of frozen shoulder   Lower Extremity Assessment Lower Extremity Assessment: Defer to PT evaluation   Cervical / Trunk Assessment Cervical / Trunk Assessment: Kyphotic   Communication Communication Communication: HOH   Cognition Arousal/Alertness: Awake/alert Behavior During Therapy: WFL for tasks assessed/performed Overall Cognitive Status: Within Functional Limits for tasks assessed                                 General Comments: pleasant, follows commands, limited due to [facility] at times but overall [facility]     General Comments       Exercises     Shoulder Instructions      Home Living Family/patient expects to be discharged to:: Private residence Living Arrangements: Children Available Help at Discharge: Family;Available PRN/intermittently Type of Home: House Home Access: Ramped entrance     Home Layout: One level     Bathroom Shower/Tub: Sponge bathes at baseline     Bathroom Accessibility: No   Home Equipment: BSC/3in1;Wheelchair - power;Wheelchair - manual   Additional Comments: w/c does not fit in bathroom      Prior Functioning/Environment Prior Level of Function : Needs assist             Mobility Comments: uses w/c for mobility around the house, reports able to stand and get into chair w/o issue ADLs Comments: Setup for sponge bathing tasks as w/c  will not fit in bathroom, uses BSC for toileting tasks. able to dress self. Daughter does IADLs        OT Problem List: Decreased strength;Decreased activity tolerance;Decreased range of motion;Impaired balance (sitting and/or standing)      OT Treatment/Interventions: Self-care/ADL training;Therapeutic exercise;Energy conservation;DME and/or AE instruction;Therapeutic activities;Patient/family education    OT Goals(Current goals can be found in the care plan section) Acute Rehab OT Goals Patient Stated Goal: go home today OT Goal Formulation: With patient Time For Goal Achievement: 05/18/22 Potential to Achieve Goals: Good ADL Goals Pt Will Perform Lower Body Bathing: with min guard assist;sit to/from stand;sitting/lateral leans Pt Will Perform Lower Body Dressing: with min guard assist;sitting/lateral leans;sit to/from stand Pt Will Transfer to Toilet: with min guard assist;stand pivot transfer;bedside commode  OT Frequency: Min 2X/week    Co-evaluation              AM-PAC OT "6 Clicks" Daily Activity     Outcome Measure Help from another person eating meals?: A Little Help from another person taking care of personal grooming?: A Little Help from another person toileting, which includes using toliet, bedpan, or urinal?: A Little Help from another person bathing (including washing, rinsing, drying)?: A Lot Help from another person to put on and taking off  regular upper body clothing?: A Little Help from another person to put on and taking off regular lower body clothing?: A Lot 6 Click Score: 16   End of Session Equipment Utilized During Treatment: Rolling walker (2 wheels);Gait belt Nurse Communication: Mobility status  Activity Tolerance: Patient tolerated treatment well Patient left: in bed;with call bell/phone within reach  OT Visit Diagnosis: Unsteadiness on feet (R26.81);Other abnormalities of gait and mobility (R26.89);Muscle weakness (generalized) (M62.81)                 Time: 5643-3295 OT Time Calculation (min): 22 min Charges:  OT General Charges $OT Visit: 1 Visit OT Evaluation $OT Eval Low Complexity: 1 Low  Malachy Chamber, OTR/L Acute Rehab Services Office: [phone]   Layla Maw 05/04/2022, 8:42 AM

## 2022-05-04 NOTE — Progress Notes (Signed)
Patient to MRI from Emergency department with daugther Dawn at the bedside for Brain scan wo and Angio head wo contrast. Patient has Product/process development scientist. [facility] performed with Jonathan-Rep. Orders for VOO 80, tachy therapies off. Will re-program once scan is completed.

## 2022-05-04 NOTE — Progress Notes (Addendum)
Patient admitted early this morning for left facial droop and slurred speech.  Neurology following.  MRI of brain pending.  I have reviewed patient's medical records including this morning's H&P, current vitals, labs and medications myself.  Patient seen and examined at bedside.  Blood pressure intermittently on the lower side.  Extremely hard of hearing.  Will give normal saline bolus and subsequently continue normal saline at 75 cc an hour.  Follow further neurology recommendations.  Palliative care consultation for goals of care discussion: Patient is still listed as full code.

## 2022-05-04 NOTE — Progress Notes (Addendum)
STROKE TEAM PROGRESS NOTE   INTERVAL HISTORY His daughter is at the bedside.  States he feels complete normal this morning. Upon my arrival he was hypotensive with a systolic in the 51V while sitting on the side of the bed. Repeat BP in the low 80s, but oriented.  Daughter states his speech and facial droop have improved significantly since yesterday   Vitals:   05/04/22 0800 05/04/22 0815 05/04/22 0830 05/04/22 0925  BP: (!) 98/59 (!) 116/94 (!) 98/53   Pulse: 64 87 73   Resp: '17 16 15   '$ Temp:    98 F (36.7 C)  TempSrc:    Oral  SpO2: 96% 96% 97%   Weight:       CBC:  Recent Labs  Lab 05/03/22 1721 05/03/22 1725 05/04/22 0410  WBC 10.2  --  9.5  NEUTROABS 6.9  --   --   HGB 12.3* 12.6* 12.2*  HCT 37.9* 37.0* 36.9*  MCV 101.1*  --  98.9  PLT 248  --  616   Basic Metabolic Panel:  Recent Labs  Lab 05/03/22 1721 05/03/22 1725 05/04/22 0410  NA 141 141  --   K 3.8 3.8  --   CL 103 102  --   CO2 26  --   --   GLUCOSE 109* 105*  --   BUN 24* 25*  --   CREATININE 1.36* 1.40*  --   CALCIUM 9.1  --   --   MG  --   --  2.2   Lipid Panel: No results for input(s): "CHOL", "TRIG", "HDL", "CHOLHDL", "VLDL", "LDLCALC" in the last 168 hours. HgbA1c:  Recent Labs  Lab 05/04/22 0410  HGBA1C 5.1   Urine Drug Screen:  Recent Labs  Lab 05/03/22 1725  LABOPIA NONE DETECTED  COCAINSCRNUR NONE DETECTED  LABBENZ NONE DETECTED  AMPHETMU NONE DETECTED  THCU NONE DETECTED  LABBARB NONE DETECTED    Alcohol Level  Recent Labs  Lab 05/03/22 1721  ETH <10    IMAGING past 24 hours MR BRAIN WO CONTRAST  Result Date: 05/04/2022 CLINICAL DATA:  Neuro deficit, acute, stroke suspected. Left facial droop and slurred speech. EXAM: MRI HEAD WITHOUT CONTRAST MRA HEAD WITHOUT CONTRAST TECHNIQUE: Multiplanar, multi-echo pulse sequences of the brain and surrounding structures were acquired without intravenous contrast. Angiographic images of the Circle of Willis were acquired using  MRA technique without intravenous contrast. COMPARISON:  Head CT 05/03/2022 FINDINGS: MRI HEAD FINDINGS Brain: Small acute right MCA territory cortical infarcts are present involving the insula and frontal lobe. Scattered small T2 hyperintensities in the cerebral white matter and pons are nonspecific but compatible with mild chronic small vessel ischemic disease. Chronic lacunar infarcts are noted in the caudate nuclei and cerebellar hemispheres bilaterally. Generalized cerebral atrophy is likely within normal limits for age. No intracranial hemorrhage, mass, midline shift, or extra-axial fluid collection is identified. Vascular: Major intracranial vascular flow voids are preserved. Skull and upper cervical spine: Unremarkable bone marrow signal. Sinuses/Orbits: Bilateral cataract extraction. Clear paranasal sinuses. Small mastoid effusions. Other: None. MRA HEAD FINDINGS Anterior circulation: The internal carotid arteries are widely patent from skull base to carotid termini. ACAs and MCAs are patent without evidence of a proximal branch occlusion or significant proximal stenosis. No aneurysm is identified. Posterior circulation: The intracranial vertebral arteries are patent to the basilar with the left being dominant. The right vertebral artery is hypoplastic distal to the PICA origin. Patent PICA and SCA origins are seen bilaterally. The basilar  artery is widely patent. Posterior communicating arteries are diminutive or absent. Both PCAs are patent without evidence of significant proximal stenosis. No aneurysm is identified. Anatomic variants: None. IMPRESSION: 1. Small acute right MCA infarcts. 2. Mild chronic small vessel ischemic disease. 3. Negative head MRA. Electronically Signed   By: Logan Bores M.D.   On: 05/04/2022 13:57   MR ANGIO HEAD WO CONTRAST  Result Date: 05/04/2022 CLINICAL DATA:  Neuro deficit, acute, stroke suspected. Left facial droop and slurred speech. EXAM: MRI HEAD WITHOUT CONTRAST  MRA HEAD WITHOUT CONTRAST TECHNIQUE: Multiplanar, multi-echo pulse sequences of the brain and surrounding structures were acquired without intravenous contrast. Angiographic images of the Circle of Willis were acquired using MRA technique without intravenous contrast. COMPARISON:  Head CT 05/03/2022 FINDINGS: MRI HEAD FINDINGS Brain: Small acute right MCA territory cortical infarcts are present involving the insula and frontal lobe. Scattered small T2 hyperintensities in the cerebral white matter and pons are nonspecific but compatible with mild chronic small vessel ischemic disease. Chronic lacunar infarcts are noted in the caudate nuclei and cerebellar hemispheres bilaterally. Generalized cerebral atrophy is likely within normal limits for age. No intracranial hemorrhage, mass, midline shift, or extra-axial fluid collection is identified. Vascular: Major intracranial vascular flow voids are preserved. Skull and upper cervical spine: Unremarkable bone marrow signal. Sinuses/Orbits: Bilateral cataract extraction. Clear paranasal sinuses. Small mastoid effusions. Other: None. MRA HEAD FINDINGS Anterior circulation: The internal carotid arteries are widely patent from skull base to carotid termini. ACAs and MCAs are patent without evidence of a proximal branch occlusion or significant proximal stenosis. No aneurysm is identified. Posterior circulation: The intracranial vertebral arteries are patent to the basilar with the left being dominant. The right vertebral artery is hypoplastic distal to the PICA origin. Patent PICA and SCA origins are seen bilaterally. The basilar artery is widely patent. Posterior communicating arteries are diminutive or absent. Both PCAs are patent without evidence of significant proximal stenosis. No aneurysm is identified. Anatomic variants: None. IMPRESSION: 1. Small acute right MCA infarcts. 2. Mild chronic small vessel ischemic disease. 3. Negative head MRA. Electronically Signed   By:  Logan Bores M.D.   On: 05/04/2022 13:57   ECHOCARDIOGRAM COMPLETE BUBBLE STUDY  Result Date: 05/04/2022    ECHOCARDIOGRAM REPORT   Patient Name:   Jerry Wood Date of Exam: 05/04/2022 Medical Rec #:  [id number]      Height:       61.0 in Accession #:    [id number]     Weight:       158.1 lb Date of Birth:  08/03/1921      BSA:          1.709 m Patient Age:    53 years       BP:           80/53 mmHg Patient Gender: M              HR:           57 bpm. Exam Location:  Inpatient Procedure: 2D Echo and Saline Contrast Bubble Study Indications:    srtoke  History:        Patient has prior history of Echocardiogram examinations, most                 recent 02/01/2015. Pacemaker, Signs/Symptoms:Edema; Risk                 Factors:Hypertension.  Sonographer:    Harvie Junior Referring Phys: [id number] Lorenda Cahill  HALL  Sonographer Comments: Technically difficult study due to poor echo windows and no subcostal window. IMPRESSIONS  1. Left ventricular ejection fraction, by estimation, is 55 to 60%. The left ventricle has normal function. The left ventricle has no regional wall motion abnormalities. Left ventricular diastolic function could not be evaluated.  2. Right ventricular systolic function is normal. The right ventricular size is normal.  3. Left atrial size was severely dilated.  4. The mitral valve is grossly normal. Mild mitral valve regurgitation. No evidence of mitral stenosis.  5. The aortic valve is tricuspid. There is moderate calcification of the aortic valve. There is moderate thickening of the aortic valve. Aortic valve regurgitation is not visualized. Mild aortic valve stenosis.  6. Agitated saline contrast bubble study was negative, with no evidence of any interatrial shunt. Conclusion(s)/Recommendation(s): No intracardiac source of embolism detected on this transthoracic study. Consider a transesophageal echocardiogram to exclude cardiac source of embolism if clinically indicated. FINDINGS  Left  Ventricle: Left ventricular ejection fraction, by estimation, is 55 to 60%. The left ventricle has normal function. The left ventricle has no regional wall motion abnormalities. The left ventricular internal cavity size was normal in size. Suboptimal image quality limits for assessment of left ventricular hypertrophy. Left ventricular diastolic function could not be evaluated due to atrial fibrillation. Left ventricular diastolic function could not be evaluated. Right Ventricle: The right ventricular size is normal. No increase in right ventricular wall thickness. Right ventricular systolic function is normal. Left Atrium: Left atrial size was severely dilated. Right Atrium: Right atrial size was normal in size. Pericardium: Trivial pericardial effusion is present. Mitral Valve: The mitral valve is grossly normal. There is mild calcification of the anterior mitral valve leaflet(s). Mild mitral valve regurgitation. No evidence of mitral valve stenosis. Tricuspid Valve: The tricuspid valve is grossly normal. Tricuspid valve regurgitation is mild . No evidence of tricuspid stenosis. Aortic Valve: The aortic valve is tricuspid. There is moderate calcification of the aortic valve. There is moderate thickening of the aortic valve. Aortic valve regurgitation is not visualized. Mild aortic stenosis is present. Aortic valve mean gradient measures 8.0 mmHg. Aortic valve peak gradient measures 13.1 mmHg. Aortic valve area, by VTI measures 1.37 cm. Pulmonic Valve: The pulmonic valve was grossly normal. Pulmonic valve regurgitation is trivial. No evidence of pulmonic stenosis. Aorta: The aortic root and ascending aorta are structurally normal, with no evidence of dilitation. Venous: The inferior vena cava was not well visualized. IAS/Shunts: No atrial level shunt detected by color flow Doppler. Agitated saline contrast was given intravenously to evaluate for intracardiac shunting. Agitated saline contrast bubble study was  negative, with no evidence of any interatrial shunt. Additional Comments: A device lead is visualized in the right atrium and right ventricle.  LEFT VENTRICLE PLAX 2D LVIDd:         3.90 cm     Diastology LVIDs:         3.30 cm     LV e' medial:    6.20 cm/s LV PW:         1.00 cm     LV E/e' medial:  10.4 LV IVS:        1.00 cm     LV e' lateral:   9.68 cm/s LVOT diam:     2.00 cm     LV E/e' lateral: 6.7 LV SV:         50 LV SV Index:   29 LVOT Area:     3.14  cm  LV Volumes (MOD) LV vol d, MOD A2C: 35.1 ml LV vol d, MOD A4C: 57.9 ml LV vol s, MOD A2C: 24.3 ml LV vol s, MOD A4C: 23.0 ml LV SV MOD A2C:     10.8 ml LV SV MOD A4C:     57.9 ml LV SV MOD BP:      21.8 ml RIGHT VENTRICLE RV Basal diam:  3.40 cm RV Mid diam:    1.90 cm RV S prime:     17.10 cm/s TAPSE (M-mode): 1.3 cm LEFT ATRIUM              Index        RIGHT ATRIUM           Index LA Vol (A2C):   77.0 ml  45.05 ml/m  RA Area:     13.10 cm LA Vol (A4C):   128.0 ml 74.90 ml/m  RA Volume:   30.50 ml  17.85 ml/m LA Biplane Vol: 104.0 ml 60.85 ml/m  AORTIC VALVE                     PULMONIC VALVE AV Area (Vmax):    1.36 cm      PV Vmax:       0.81 m/s AV Area (Vmean):   1.37 cm      PV Peak grad:  2.7 mmHg AV Area (VTI):     1.37 cm AV Vmax:           181.00 cm/s AV Vmean:          133.000 cm/s AV VTI:            0.362 m AV Peak Grad:      13.1 mmHg AV Mean Grad:      8.0 mmHg LVOT Vmax:         78.30 cm/s LVOT Vmean:        57.800 cm/s LVOT VTI:          0.158 m LVOT/AV VTI ratio: 0.44  AORTA Ao Root diam: 3.30 cm MITRAL VALVE               TRICUSPID VALVE MV Area (PHT): 4.49 cm    TR Peak grad:   29.6 mmHg MV Decel Time: 169 msec    TR Vmax:        272.00 cm/s MR Peak grad: 59.4 mmHg MR Vmax:      385.25 cm/s  SHUNTS MV E velocity: 64.70 cm/s  Systemic VTI:  0.16 m MV A velocity: 35.10 cm/s  Systemic Diam: 2.00 cm MV E/A ratio:  1.84 Eleonore Chiquito MD Electronically signed by Eleonore Chiquito MD Signature Date/Time: 05/04/2022/11:21:27 AM    Final     VAS US CAROTID  Result Date: 05/04/2022 Carotid Arterial Duplex Study Patient Name:  Jerry Wood  Date of Exam:   05/04/2022 Medical Rec #: [id number]       Accession #:    [id number] Date of Birth: 08-14-1921       Patient Gender: M Patient Age:   57 years Exam Location:  [facility] Procedure:      VAS US CAROTID Referring Phys: Janine Ores --------------------------------------------------------------------------------  Indications:       Speech disturbance and Visual disturbance. Risk Factors:      Hypertension, coronary artery disease. Comparison Study:  no prior Performing Technologist: Archie Patten RVS  Examination Guidelines: A complete evaluation includes B-mode imaging, spectral Doppler, color Doppler, and  power Doppler as needed of all accessible portions of each vessel. Bilateral testing is considered an integral part of a complete examination. Limited examinations for reoccurring indications may be performed as noted.  Right Carotid Findings: +----------+--------+--------+--------+------------------+--------+           PSV cm/sEDV cm/sStenosisPlaque DescriptionComments +----------+--------+--------+--------+------------------+--------+ CCA Prox  58      12              heterogenous               +----------+--------+--------+--------+------------------+--------+ CCA Distal59      12              heterogenous               +----------+--------+--------+--------+------------------+--------+ ICA Prox  55      18      1-39%   heterogenous      tortuous +----------+--------+--------+--------+------------------+--------+ ICA Distal60      17                                         +----------+--------+--------+--------+------------------+--------+ ECA       88      7                                          +----------+--------+--------+--------+------------------+--------+ +----------+--------+-------+--------+-------------------+           PSV  cm/sEDV cmsDescribeArm Pressure (mmHG) +----------+--------+-------+--------+-------------------+ [id number]                                         +----------+--------+-------+--------+-------------------+ +---------+--------+--+--------+--+---------+ VertebralPSV cm/s46EDV cm/s10Antegrade +---------+--------+--+--------+--+---------+  Left Carotid Findings: +----------+--------+--------+--------+------------------+--------+           PSV cm/sEDV cm/sStenosisPlaque DescriptionComments +----------+--------+--------+--------+------------------+--------+ CCA Prox  60      14              heterogenous               +----------+--------+--------+--------+------------------+--------+ CCA Distal68      14              heterogenous               +----------+--------+--------+--------+------------------+--------+ ICA Prox  52      12      1-39%   heterogenous               +----------+--------+--------+--------+------------------+--------+ ICA Distal65      21                                         +----------+--------+--------+--------+------------------+--------+ ECA       93                                                 +----------+--------+--------+--------+------------------+--------+ +----------+--------+--------+--------+-------------------+           PSV cm/sEDV cm/sDescribeArm Pressure (mmHG) +----------+--------+--------+--------+-------------------+ [id number]                                          +----------+--------+--------+--------+-------------------+ +---------+--------+--+--------+--+---------+  VertebralPSV cm/s42EDV cm/s13Antegrade +---------+--------+--+--------+--+---------+   Summary: Right Carotid: Velocities in the right ICA are consistent with a 1-39% stenosis. Left Carotid: Velocities in the left ICA are consistent with a 1-39% stenosis. Vertebrals: Bilateral vertebral arteries demonstrate antegrade flow. *See  table(s) above for measurements and observations.     Preliminary    DG Chest 1 View  Result Date: 05/03/2022 CLINICAL DATA:  [id number] Pneumonia [id number] EXAM: CHEST  1 VIEW COMPARISON:  Chest x-ray 01/26/2015 FINDINGS: Left chest wall 2 lead pacemaker in grossly appropriate position. The heart and mediastinal contours are unchanged. No focal consolidation. No pulmonary edema. No pleural effusion. No pneumothorax. No acute osseous abnormality. IMPRESSION: No active disease. Electronically Signed   By: Iven Finn M.D.   On: 05/03/2022 18:20   CT HEAD CODE STROKE WO CONTRAST  Result Date: 05/03/2022 CLINICAL DATA:  Code stroke. EXAM: CT HEAD WITHOUT CONTRAST TECHNIQUE: Contiguous axial images were obtained from the base of the skull through the vertex without intravenous contrast. RADIATION DOSE REDUCTION: This exam was performed according to the departmental dose-optimization program which includes automated exposure control, adjustment of the mA and/or kV according to patient size and/or use of iterative reconstruction technique. COMPARISON:  CT head 07/21/2012 FINDINGS: Brain: There is no acute intracranial hemorrhage, extra-axial fluid collection, or acute infarct. Parenchymal volume is normal for age. The ventricles are normal in size. Gray-white differentiation is preserved. There is a small remote infarct in the right caudate head and small remote infarcts in the bilateral cerebellar hemispheres, unchanged since 2014. There is no mass lesion.  There is no mass effect or midline shift. Vascular: No dense vessel is seen. There is calcification of the bilateral carotid siphons. Skull: Normal. Negative for fracture or focal lesion. Sinuses/Orbits: The paranasal sinuses are clear. Bilateral lens implants are in place. The globes and orbits are otherwise unremarkable. Other: None. ASPECTS [facility] Stroke Program Early CT Score) - Ganglionic level infarction (caudate, lentiform nuclei, internal capsule,  insula, M1-M3 cortex): 7 - Supraganglionic infarction (M4-M6 cortex): 3 Total score (0-10 with 10 being normal): 10 IMPRESSION: 1. No acute intracranial pathology. 2. ASPECTS is 10 Findings communicated to Dr. Rory Percy at 5:38 p.m. Electronically Signed   By: Valetta Mole M.D.   On: 05/03/2022 17:42    PHYSICAL EXAM GENERAL: Awake, alert, in no acute distress. Patient does have large stains on his shirt and undershirt, presumably from possible vomiting PTA.  Psych: Affect appropriate for situation, patient is calm and cooperative with examination Head: Normocephalic and atraumatic, without obvious abnormality EENT: Normal conjunctivae, dry mucous membranes, no OP obstruction LUNGS: Normal respiratory effort. Non-labored breathing on room air CV: Irregular rate and rhythm on cardiac monitor ABDOMEN: Soft, non-tender, non-distended Extremities: Warm, well perfused, without obvious deformity   NEURO:  Mental Status: Awake, alert, and oriented to self, age, month, place, and situation. He is able to provide some information regarding history of present illness with intermittent confusion Speech/Language: speech is hypophonic and dysarthric. Patient follows simple commands. There is no evidence of aphasia or neglect. Cranial Nerves:  II: PERRL. visual fields full.  III, IV, VI: EOMI, tracks examiner throughout   V: Sensation is intact to light touch and symmetrical to face. VII: Slight left mouth droop and NLF flattening noted VIII: Hearing is intact to voice with hearing aids in place- HOH at baseline IX, X: Hypophonic XI: Head is grossly midline XII: Tongue protrudes midline Motor: Right upper extremity elevates antigravity without vertical drift, left upper extremity elevation is limited  related to left shoulder pain (chronic). Bilateral lower extremities move without gravity better and able to elevate against gravity. Tone is normal. Bulk is normal.  Sensation: Intact to light touch  bilaterally in all four extremities.  Coordination: Upper extremities without overt dysmetria. HKS unable to perform. Gait: Deferred patient is wheelchair-bound at baseline  ASSESSMENT/PLAN Jerry Wood is a 88 y.o. male with history of  Parkinson's disease, permanent atrial fibrillation on Eliquis, hypertension, history of tachybradycardia syndrome status post pacemaker placement, heart coronary artery disease status post PCI to RCA, CKD 3B, nonambulatory, wheelchair dependent, who presented to [facility] [place] ED from home via EMS as a code stroke.   Stroke: Small scattered right MCA infracts, embolic pattern, likely due to afib even on eliquis Code Stroke CT head No acute abnormality. ASPECTS 10.    MRI  Small acute right MCA scattered infarcts.  MRA  Negative Carotid Doppler unremarkable 2D Echo EF 55-60% Left atria is severely dilated. LDL pending HgbA1c 5.1 VTE prophylaxis - eliquis Eliquis (apixaban) daily prior to admission, now on Eliquis (apixaban) daily.  Therapy recommendations:  HH Disposition:  pending - code status DNR  Atrial fibrillation Anticoagulated with eliquis PTA Paced rhythm- rate controlled with cardizem Continue eliquis on discharge - palliative care on board  Hypotension Low blood pressure at baseline per daughter May be related to Parkinson's disease BP 80s-90s Asymptomatic  Discussed with Dr. Starla Link, will start low dose midodrine for a trial.   Hyperlipidemia Home meds:  Pravastatin, zetia, resumed in hospital LDL pending, goal < 70 Continue pravastatin 40 and zetia No high intensity statin given advanced age.  Other Stroke Risk Factors Advanced Age >/= 56  Coronary artery disease  Other Active Problems S/p pacemaker Parkinson's disease Continue sinemet IR - follows with Dr[facility] day # 0  Patient seen and examined by NP/APP with MD. MD to update note as needed.   Janine Ores, DNP, FNP-BC [facility] Pager: [phone]  ATTENDING NOTE: I reviewed above note and agree with the assessment and plan. Pt was seen and examined.   88 year old male with history of hypertension, hyperlipidemia, CAD, Parkinson disease wheelchair-bound, A-fib on Eliquis, status post pacemaker admitted for left-sided facial droop, left-sided weakness and slurred speech.  CT no acute abnormality.  MRI brain right MCA scattered small infarcts.  MRA unremarkable.  Carotid Doppler unremarkable.  EF 55 to 60%.  A1c 5.1, LDL pending.  UDS negative.  Creatinine 1.4.  BP low, also low at home per daughter.  On exam, daughter at bedside.  Patient lying in bed, eyes open, awake, alert, orientated to self, age, time and place.  No aphasia, mild dysarthria, follows some commands.  Able to name and repeat.  No gaze palsy, visual field full, no significant facial droop.  Tongue midline.  Left upper extremity chronic shoulder injury proximal 3/5, distal at least 4/5.  Right upper extremity 4/5.  Bilateral lower extremity proximal 2 -/5, distal ankle DF/PF 3/5.  Sensation symmetrical, bilateral finger-to-nose intact grossly but slow.  Etiology for patient right MCA scattered stroke still embolic pattern concerning for A-fib even on Eliquis.  Given his advanced age, palliative care on board, patient now DNR, will continue Eliquis at this time without change.  Continue pravastatin and Zetia also without change given advanced age.  Patient BP 80s-90s, quite low from time to time, although home chronic hypotension, still like to see BP higher than current reading, discussed with Dr. Starla Link, started low-dose midodrine.  Continue  Sinemet for PD treatment, he follows with [name] as outpatient at [facility].  For detailed assessment and plan, please refer to above/below as I have made changes wherever appropriate.   Neurology will sign off. Please call with questions. Pt will follow up with [name] at [facility] in about 4 weeks. Thanks for the  consult.   Rosalin Hawking, MD PhD Stroke Neurology 05/04/2022 4:40 PM     To contact Stroke Continuity provider, please refer to [email or web address] After hours, contact General Neurology

## 2022-05-05 DIAGNOSIS — Z7189 Other specified counseling: Secondary | ICD-10-CM | POA: Diagnosis not present

## 2022-05-05 DIAGNOSIS — I639 Cerebral infarction, unspecified: Secondary | ICD-10-CM | POA: Diagnosis not present

## 2022-05-05 DIAGNOSIS — R531 Weakness: Secondary | ICD-10-CM | POA: Diagnosis not present

## 2022-05-05 LAB — LIPID PANEL
Cholesterol: 99 mg/dL (ref 0–200)
HDL: 40 mg/dL — ABNORMAL LOW
LDL Cholesterol: 52 mg/dL (ref 0–99)
Total CHOL/HDL Ratio: 2.5 ratio
Triglycerides: 37 mg/dL
VLDL: 7 mg/dL (ref 0–40)

## 2022-05-05 LAB — RENAL FUNCTION PANEL
Albumin: 3 g/dL — ABNORMAL LOW (ref 3.5–5.0)
Anion gap: 12 (ref 5–15)
BUN: 17 mg/dL (ref 8–23)
CO2: 22 mmol/L (ref 22–32)
Calcium: 8.8 mg/dL — ABNORMAL LOW (ref 8.9–10.3)
Chloride: 109 mmol/L (ref 98–111)
Creatinine, Ser: 1.16 mg/dL (ref 0.61–1.24)
GFR, Estimated: 57 mL/min — ABNORMAL LOW
Glucose, Bld: 107 mg/dL — ABNORMAL HIGH (ref 70–99)
Phosphorus: 2.3 mg/dL — ABNORMAL LOW (ref 2.5–4.6)
Potassium: 3.6 mmol/L (ref 3.5–5.1)
Sodium: 143 mmol/L (ref 135–145)

## 2022-05-05 LAB — MAGNESIUM: Magnesium: 2 mg/dL (ref 1.7–2.4)

## 2022-05-05 MED ORDER — HALOPERIDOL LACTATE 5 MG/ML IJ SOLN
1.0000 mg | Freq: Four times a day (QID) | INTRAMUSCULAR | Status: DC | PRN
  Administered 2022-05-05: 1 mg via INTRAMUSCULAR
  Filled 2022-05-05: qty 1

## 2022-05-05 MED ORDER — LORAZEPAM 2 MG/ML IJ SOLN: 0.5000 mg | INTRAMUSCULAR | Status: DC | PRN

## 2022-05-05 NOTE — Progress Notes (Addendum)
Physical Therapy Treatment Patient Details Name: Jerry Wood MRN: [id number] DOB: 1921-09-11 Today's Date: 05/05/2022   History of Present Illness Pt is a 88 y/o male who presented with L facial droop and slurred speech. CT head negative, MRI brain showing small R MCA infarcts. PMH: Parkinson's, a fib, HTN, PPM, CAD, CKD3    PT Comments    RN notified PT that pt daughter has concerns about level of assist pt is requiring to transfer. Difficult to accurately predict at this point as hospital room different than pt set up I.e. pt pulls up to stand on electric wheelchair at home. Pt requiring maximal assist for bed mobility and modA for pivot transfer from bed to chair (+2 for safety). PTA, pt able to perform transfer to and from w/c with supervision. Will continue to follow acutely and assess. Pt may need SNF if does not progress and pt daughter feels she cannot provide level of assist required. Pt daughter also reports her husband recently had rotator cuff surgery as well.    Recommendations for follow up therapy are one component of a multi-disciplinary discharge planning process, led by the attending physician.  Recommendations may be updated based on patient status, additional functional criteria and insurance authorization.  Follow Up Recommendations  Skilled nursing-short term rehab (<3 hours/day)      Assistance Recommended at Discharge Frequent or constant Supervision/Assistance  Patient can return home with the following A lot of help with walking and/or transfers;A lot of help with bathing/dressing/bathroom;Assistance with cooking/housework;Assist for transportation;Help with stairs or ramp for entrance   Equipment Recommendations  None recommended by PT    Recommendations for Other Services       Precautions / Restrictions Precautions Precautions: Fall;Other (comment) Precaution Comments: impaired L shoulder ROM Restrictions Weight Bearing Restrictions: No      Mobility  Bed Mobility Overal bed mobility: Needs Assistance Bed Mobility: Supine to Sit     Supine to sit: HOB elevated, Max assist     General bed mobility comments: Minimal initiation by pt, cues for use of bed rail    Transfers Overall transfer level: Needs assistance Equipment used: None Transfers: Bed to chair/wheelchair/BSC       Squat pivot transfers: Mod assist, +2 physical assistance     General transfer comment: ModA + 2 for squat pivot towards right from bed > chair    Ambulation/Gait                   Stairs             Wheelchair Mobility    Modified Rankin (Stroke Patients Only)       Balance Overall balance assessment: Needs assistance Sitting-balance support: Bilateral upper extremity supported, Feet unsupported Sitting balance-Leahy Scale: Fair                                      Cognition Arousal/Alertness: Awake/alert Behavior During Therapy: WFL for tasks assessed/performed Overall Cognitive Status: Within Functional Limits for tasks assessed                                 General Comments: pleasant, follows commands, limited due to [facility] at times but overall [facility]        Exercises      General Comments        Pertinent Vitals/Pain  Pain Assessment Pain Assessment: No/denies pain    Home Living                          Prior Function            PT Goals (current goals can now be found in the care plan section) Acute Rehab PT Goals Patient Stated Goal: to go home PT Goal Formulation: With patient Time For Goal Achievement: 05/18/22 Potential to Achieve Goals: Good    Frequency    Min 3X/week      PT Plan Discharge plan needs to be updated    Co-evaluation              AM-PAC PT "6 Clicks" Mobility   Outcome Measure  Help needed turning from your back to your side while in a flat bed without using bedrails?: A Lot Help needed moving from lying  on your back to sitting on the side of a flat bed without using bedrails?: A Lot Help needed moving to and from a bed to a chair (including a wheelchair)?: Total Help needed standing up from a chair using your arms (e.g., wheelchair or bedside chair)?: Total Help needed to walk in hospital room?: Total Help needed climbing 3-5 steps with a railing? : Total 6 Click Score: 8    End of Session Equipment Utilized During Treatment: Gait belt Activity Tolerance: Patient tolerated treatment well Patient left: with call bell/phone within reach;with family/visitor present;in chair;with chair alarm set Nurse Communication: Mobility status PT Visit Diagnosis: Unsteadiness on feet (R26.81);Muscle weakness (generalized) (M62.81);Dizziness and giddiness (R42)     Time: 7001-7494 PT Time Calculation (min) (ACUTE ONLY): 30 min  Charges:  $Therapeutic Activity: 23-37 mins                     Jerry Wood, PT, DPT Acute Rehabilitation Services Office [phone]    Deno Etienne 05/05/2022, 4:49 PM

## 2022-05-05 NOTE — Progress Notes (Signed)
Resting and no longer restless since receiving IM Haldol.  Denies complaints.  HR now in the low 100s to upper 90s.

## 2022-05-05 NOTE — Progress Notes (Signed)
Daily Progress Note   Patient Name: Jerry Wood       Date: 05/05/2022 DOB: Mar 13, 1922  Age: 88 y.o. MRN#: [id number] Attending Physician: Jerry August, MD Primary Care Physician: Pcp, No Admit Date: 05/03/2022  Reason for Consultation/Follow-up: Establishing goals of care  Subjective: Medical records reviewed including progress notes, labs, imaging. Patient assessed at the bedside.  He denies pain or distress after placing his hearing aids in.  His daughters Jerry Wood and Jerry Wood are present for scheduled family meeting.  We then relocated to conference room on 3 W.  Created space and opportunity for family's thoughts and feelings on patient's current illness.  They note he has been very sleepy through much of today, waking up only briefly before taking a long afternoon nap.  They share that they realize this may be due to the Haldol he was given overnight, as he was doing better yesterday.  Family would like to know MRI results and this was reviewed in detail.  Provided education on the embolic pattern and mechanism of A-fib leading to a stroke.  Reviewed neurology's recommendations regarding keeping patient on Eliquis and pravastatin, Zetia.  Reviewed initiation of midodrine for trial and also echo results at family's request.  Patient's family would like to know what to do with his antihypertensives at discharge.  They are also concerned about whether they will be able to take care of him at home.  Jerry Wood is recovering from a knee replacement and her husband has just had shoulder surgery.  Patient was able to pivot for transfers unassisted and they are very clear that if this is not demonstrated prior to discharge, they would rather go with SNF placement temporarily to be sure of patient's safety and  avoid readmission to the hospital.  We discussed attempting a transfer later this afternoon when patient is more awake.  Discussed possible discharge tomorrow and family is still willing to take him home if able, as this is the patient's wish.  We discussed the risks and benefits of SNF placement, as he will likely remain agitated and restless overnight in unfamiliar environments.  They verbalized their understanding.  Their priority is to ensure patient has a good quality of life and comfort, whether for the next few days or the next 5 years.  They would never keep patient in  SNF permanently and would likely transition hospice if his quality of life worsens.  A MOST form was introduced and extensive conversation was had, covering concepts specific to code status, artifical feeding and hydration, continued IV antibiotics and rehospitalization.  Patient's daughter will continue to reflect on this and update if needed prior to discharge.  Outpatient palliative care was explained and offered.  Family is agreeable to a referral.  Questions and concerns addressed. PMT will continue to support holistically.   Length of Stay: 1  Physical Exam Vitals and nursing note reviewed.  Constitutional:      General: He is not in acute distress.    Comments: Hard of hearing  Cardiovascular:     Rate and Rhythm: Normal rate.  Pulmonary:     Effort: Pulmonary effort is normal.  Skin:    General: Skin is warm and dry.  Neurological:     Mental Status: He is alert. Mental status is at baseline.  Psychiatric:        Mood and Affect: Mood normal.        Behavior: Behavior normal.   Vital Signs: BP (!) 97/55 (BP Location: Left Arm)   Pulse 81   Temp 98.1 F (36.7 C) (Axillary)   Resp 14   Wt 69.7 kg   SpO2 97%   BMI 29.04 kg/m  SpO2: SpO2: 97 % O2 Device: O2 Device: Room Air O2 Flow Rate:        Palliative Assessment/Data: 40-50%    Palliative Care Assessment & Plan   Patient Profile: 88 y.o.  male  with past medical history of Parkinson's disease, permanent atrial fibrillation on Eliquis, hypertension, history of tachybradycardia syndrome status post pacemaker placement, heart coronary artery disease status post PCI to RCA, and CKD 3B presented to ED on 05/03/22 from home with left facial droop and left sided weakness. Patient was admitted on 05/03/2022 with left facial droop/left sided weakness with concern for TIA/CVA. CT head was non-acute.  Assessment: Goals of care conversation Parkinson's disease A-fib on Eliquis CKD 3 Scattered small right MCA infarcts, acute Debility  Recommendations/Plan: Continue DNR Continue current care Patient's family prefers to take him home with home health, but if he is unable to transfer on his own they feel SNF may be a better option.  Have discussed with nursing to assist with ambulation as tolerated MOST form introduced today; patient's daughters will complete if an update is desired [name] are agreeable to outpatient palliative care referral, TOC assistance is appreciated Psychosocial and emotional support provided PMT will continue to follow and support   Prognosis:  Unable to determine  Discharge Planning: Home with home health/palliative versus SNF with palliative  Care plan was discussed with patient, patient's 2 daughters, RN, TOC, Dr. Starla Link, PT/OT via secure chat   Total time: I spent 90 minutes in the care of the patient today in the above activities and documenting the encounter.  MDM high         [name] [name], PA-C  Palliative Medicine Team Team phone # [phone]  Thank you for allowing the Palliative Medicine Team to assist in the care of this patient. Please utilize secure chat with additional questions, if there is no response within 30 minutes please call the above phone number.  Palliative Medicine Team providers are available by phone from 7am to 7pm daily and can be reached through the  team cell phone.  Should this patient require assistance outside of these hours, please call the patient's  attending physician.

## 2022-05-05 NOTE — Progress Notes (Signed)
PROGRESS NOTE    Jerry Wood  UJW:[id number] DOB: [birth year] DOA: 05/03/2022 PCP: Pcp, No   Brief Narrative:  88 y.o. male with medical history significant for Parkinson's disease, permanent atrial fibrillation on Eliquis, hypertension, history of tachybradycardia syndrome status post pacemaker placement, coronary artery disease status post PCI to RCA, CKD 3B, nonambulatory, wheelchair dependent presented as a code stroke after he was found to have left facial droop and slurred speech along with dysphagia.  On presentation, CT head was nonacute.  Neurology was consulted.  Assessment & Plan:   Acute possibly embolic stroke presenting with left facial droop/left-sided weakness: Small scattered right MCA infarcts -Most likely embolic pattern likely due to A-fib despite being on Eliquis as per neurology -CT head was nonacute.  MRI brain showed CVA as above.  MRI negative.  Carotid Doppler unremarkable.  Echo showed EF of 55 to 60%.  A1c 5.1 LDL 52 -Neurology evaluation and follow-up appreciated.  Neurology signed off on 05/04/2022 and recommended outpatient follow-up with neurology and to continue Eliquis along with pravastatin and Zetia  Permanent A-fib -currently rate controlled -Continue Eliquis  History of tachybradycardia syndrome: Status post pacemaker placement -Outpatient follow-up with cardiology/EP  Hypotension -Possibly related to Parkinson disease.  Started on midodrine by neurology on 05/04/2022.  This will be continued on discharge as well  Hyperlipidemia -Plan as above  Hypothyroidism Continue levothyroxine  Parkinson's disease -Continue Sinemet.  Outpatient follow-up with neurology  Anemia of chronic disease -Possibly from chronic illnesses.  Hemoglobin stable.  Monitor intermittently.  Physical deconditioning Goals of care -Palliative care following.  CODE STATUS has been changed to DNR.  PT/OT recommend home health PT/OT   DVT prophylaxis: Eliquis Code  Status: DNR Family Communication: None at bedside Disposition Plan: Status is: Inpatient Remains inpatient appropriate because: Of severity of illness.  Possible discharge tomorrow if remains stable  Consultants: Neurology  Procedures: Echo  Antimicrobials: None   Subjective: Patient seen and examined at bedside.  Wakes up slightly, extremely hard of hearing.  No fever, vomiting, seizures reported.  Objective: Vitals:   05/05/22 0450 05/05/22 0700 05/05/22 0824 05/05/22 0825  BP:   (!) 97/55   Pulse:   81   Resp:   14   Temp:  98.1 F (36.7 C)    TempSrc:  Axillary    SpO2:    97%  Weight: 69.7 kg       Intake/Output Summary (Last 24 hours) at 05/05/2022 1009 Last data filed at 05/04/2022 1938 Gross per 24 hour  Intake 947.97 ml  Output 150 ml  Net 797.97 ml   Filed Weights   05/03/22 1725 05/05/22 0450  Weight: 71.7 kg 69.7 kg    Examination:  General exam: Appears calm and comfortable.  Elderly male lying in bed.  Currently on room air. Respiratory system: Bilateral decreased breath sounds at bases with some scattered crackles Cardiovascular system: S1 & S2 heard, Rate controlled Gastrointestinal system: Abdomen is nondistended, soft and nontender. Normal bowel sounds heard. Extremities: No cyanosis, clubbing; trace lower extremity edema  Central nervous system: Wakes up slightly, extremely hard of hearing, extremely poor historian.  Extremely slow to respond.  No focal neurological deficits. Moving extremities Skin: No rashes, lesions or ulcers Psychiatry: Flat affect.  No signs of agitation.    Data Reviewed: I have personally reviewed following labs and imaging studies  CBC: Recent Labs  Lab 05/03/22 1721 05/03/22 1725 05/04/22 0410  WBC 10.2  --  9.5  NEUTROABS 6.9  --   --  HGB 12.3* 12.6* 12.2*  HCT 37.9* 37.0* 36.9*  MCV 101.1*  --  98.9  PLT 248  --  275   Basic Metabolic Panel: Recent Labs  Lab 05/03/22 1721 05/03/22 1725  05/04/22 0410 05/05/22 0413 05/05/22 0414  NA 141 141  --  143  --   K 3.8 3.8  --  3.6  --   CL 103 102  --  109  --   CO2 26  --   --  22  --   GLUCOSE 109* 105*  --  107*  --   BUN 24* 25*  --  17  --   CREATININE 1.36* 1.40*  --  1.16  --   CALCIUM 9.1  --   --  8.8*  --   MG  --   --  2.2  --  2.0  PHOS  --   --   --  2.3*  --    GFR: Estimated Creatinine Clearance: 29.8 mL/min (by C-G formula based on SCr of 1.16 mg/dL). Liver Function Tests: Recent Labs  Lab 05/03/22 1721 05/05/22 0413  AST 22  --   ALT 6  --   ALKPHOS 108  --   BILITOT 0.7  --   PROT 6.5  --   ALBUMIN 3.6 3.0*   No results for input(s): "LIPASE", "AMYLASE" in the last 168 hours. No results for input(s): "AMMONIA" in the last 168 hours. Coagulation Profile: Recent Labs  Lab 05/03/22 1721  INR 1.2   Cardiac Enzymes: No results for input(s): "CKTOTAL", "CKMB", "CKMBINDEX", "TROPONINI" in the last 168 hours. BNP (last 3 results) No results for input(s): "PROBNP" in the last 8760 hours. HbA1C: Recent Labs    05/04/22 0410  HGBA1C 5.1   CBG: Recent Labs  Lab 05/03/22 1721  GLUCAP 108*   Lipid Profile: Recent Labs    05/05/22 0413  CHOL 99  HDL 40*  LDLCALC 52  TRIG 37  CHOLHDL 2.5   Thyroid Function Tests: No results for input(s): "TSH", "T4TOTAL", "FREET4", "T3FREE", "THYROIDAB" in the last 72 hours. Anemia Panel: No results for input(s): "VITAMINB12", "FOLATE", "FERRITIN", "TIBC", "IRON", "RETICCTPCT" in the last 72 hours. Sepsis Labs: No results for input(s): "PROCALCITON", "LATICACIDVEN" in the last 168 hours.  No results found for this or any previous visit (from the past 240 hour(s)).       Radiology Studies: MR BRAIN WO CONTRAST  Result Date: 05/04/2022 CLINICAL DATA:  Neuro deficit, acute, stroke suspected. Left facial droop and slurred speech. EXAM: MRI HEAD WITHOUT CONTRAST MRA HEAD WITHOUT CONTRAST TECHNIQUE: Multiplanar, multi-echo pulse sequences of the  brain and surrounding structures were acquired without intravenous contrast. Angiographic images of the Circle of Willis were acquired using MRA technique without intravenous contrast. COMPARISON:  Head CT 05/03/2022 FINDINGS: MRI HEAD FINDINGS Brain: Small acute right MCA territory cortical infarcts are present involving the insula and frontal lobe. Scattered small T2 hyperintensities in the cerebral white matter and pons are nonspecific but compatible with mild chronic small vessel ischemic disease. Chronic lacunar infarcts are noted in the caudate nuclei and cerebellar hemispheres bilaterally. Generalized cerebral atrophy is likely within normal limits for age. No intracranial hemorrhage, mass, midline shift, or extra-axial fluid collection is identified. Vascular: Major intracranial vascular flow voids are preserved. Skull and upper cervical spine: Unremarkable bone marrow signal. Sinuses/Orbits: Bilateral cataract extraction. Clear paranasal sinuses. Small mastoid effusions. Other: None. MRA HEAD FINDINGS Anterior circulation: The internal carotid arteries are widely patent from skull base  to carotid termini. ACAs and MCAs are patent without evidence of a proximal branch occlusion or significant proximal stenosis. No aneurysm is identified. Posterior circulation: The intracranial vertebral arteries are patent to the basilar with the left being dominant. The right vertebral artery is hypoplastic distal to the PICA origin. Patent PICA and SCA origins are seen bilaterally. The basilar artery is widely patent. Posterior communicating arteries are diminutive or absent. Both PCAs are patent without evidence of significant proximal stenosis. No aneurysm is identified. Anatomic variants: None. IMPRESSION: 1. Small acute right MCA infarcts. 2. Mild chronic small vessel ischemic disease. 3. Negative head MRA. Electronically Signed   By: Logan Bores M.D.   On: 05/04/2022 13:57   MR ANGIO HEAD WO CONTRAST  Result  Date: 05/04/2022 CLINICAL DATA:  Neuro deficit, acute, stroke suspected. Left facial droop and slurred speech. EXAM: MRI HEAD WITHOUT CONTRAST MRA HEAD WITHOUT CONTRAST TECHNIQUE: Multiplanar, multi-echo pulse sequences of the brain and surrounding structures were acquired without intravenous contrast. Angiographic images of the Circle of Willis were acquired using MRA technique without intravenous contrast. COMPARISON:  Head CT 05/03/2022 FINDINGS: MRI HEAD FINDINGS Brain: Small acute right MCA territory cortical infarcts are present involving the insula and frontal lobe. Scattered small T2 hyperintensities in the cerebral white matter and pons are nonspecific but compatible with mild chronic small vessel ischemic disease. Chronic lacunar infarcts are noted in the caudate nuclei and cerebellar hemispheres bilaterally. Generalized cerebral atrophy is likely within normal limits for age. No intracranial hemorrhage, mass, midline shift, or extra-axial fluid collection is identified. Vascular: Major intracranial vascular flow voids are preserved. Skull and upper cervical spine: Unremarkable bone marrow signal. Sinuses/Orbits: Bilateral cataract extraction. Clear paranasal sinuses. Small mastoid effusions. Other: None. MRA HEAD FINDINGS Anterior circulation: The internal carotid arteries are widely patent from skull base to carotid termini. ACAs and MCAs are patent without evidence of a proximal branch occlusion or significant proximal stenosis. No aneurysm is identified. Posterior circulation: The intracranial vertebral arteries are patent to the basilar with the left being dominant. The right vertebral artery is hypoplastic distal to the PICA origin. Patent PICA and SCA origins are seen bilaterally. The basilar artery is widely patent. Posterior communicating arteries are diminutive or absent. Both PCAs are patent without evidence of significant proximal stenosis. No aneurysm is identified. Anatomic variants: None.  IMPRESSION: 1. Small acute right MCA infarcts. 2. Mild chronic small vessel ischemic disease. 3. Negative head MRA. Electronically Signed   By: Logan Bores M.D.   On: 05/04/2022 13:57   ECHOCARDIOGRAM COMPLETE BUBBLE STUDY  Result Date: 05/04/2022    ECHOCARDIOGRAM REPORT   Patient Name:   MARSHALL KAMPF Date of Exam: 05/04/2022 Medical Rec #:  [id number]      Height:       61.0 in Accession #:    [id number]     Weight:       158.1 lb Date of Birth:  1921/10/11      BSA:          1.709 m Patient Age:    88 years       BP:           80/53 mmHg Patient Gender: M              HR:           57 bpm. Exam Location:  Inpatient Procedure: 2D Echo and Saline Contrast Bubble Study Indications:    srtoke  History:  Patient has prior history of Echocardiogram examinations, most                 recent 02/01/2015. Pacemaker, Signs/Symptoms:Edema; Risk                 Factors:Hypertension.  Sonographer:    Harvie Junior Referring Phys: [id number] [place]  Sonographer Comments: Technically difficult study due to poor echo windows and no subcostal window. IMPRESSIONS  1. Left ventricular ejection fraction, by estimation, is 55 to 60%. The left ventricle has normal function. The left ventricle has no regional wall motion abnormalities. Left ventricular diastolic function could not be evaluated.  2. Right ventricular systolic function is normal. The right ventricular size is normal.  3. Left atrial size was severely dilated.  4. The mitral valve is grossly normal. Mild mitral valve regurgitation. No evidence of mitral stenosis.  5. The aortic valve is tricuspid. There is moderate calcification of the aortic valve. There is moderate thickening of the aortic valve. Aortic valve regurgitation is not visualized. Mild aortic valve stenosis.  6. Agitated saline contrast bubble study was negative, with no evidence of any interatrial shunt. Conclusion(s)/Recommendation(s): No intracardiac source of embolism detected on this  transthoracic study. Consider a transesophageal echocardiogram to exclude cardiac source of embolism if clinically indicated. FINDINGS  Left Ventricle: Left ventricular ejection fraction, by estimation, is 55 to 60%. The left ventricle has normal function. The left ventricle has no regional wall motion abnormalities. The left ventricular internal cavity size was normal in size. Suboptimal image quality limits for assessment of left ventricular hypertrophy. Left ventricular diastolic function could not be evaluated due to atrial fibrillation. Left ventricular diastolic function could not be evaluated. Right Ventricle: The right ventricular size is normal. No increase in right ventricular wall thickness. Right ventricular systolic function is normal. Left Atrium: Left atrial size was severely dilated. Right Atrium: Right atrial size was normal in size. Pericardium: Trivial pericardial effusion is present. Mitral Valve: The mitral valve is grossly normal. There is mild calcification of the anterior mitral valve leaflet(s). Mild mitral valve regurgitation. No evidence of mitral valve stenosis. Tricuspid Valve: The tricuspid valve is grossly normal. Tricuspid valve regurgitation is mild . No evidence of tricuspid stenosis. Aortic Valve: The aortic valve is tricuspid. There is moderate calcification of the aortic valve. There is moderate thickening of the aortic valve. Aortic valve regurgitation is not visualized. Mild aortic stenosis is present. Aortic valve mean gradient measures 8.0 mmHg. Aortic valve peak gradient measures 13.1 mmHg. Aortic valve area, by VTI measures 1.37 cm. Pulmonic Valve: The pulmonic valve was grossly normal. Pulmonic valve regurgitation is trivial. No evidence of pulmonic stenosis. Aorta: The aortic root and ascending aorta are structurally normal, with no evidence of dilitation. Venous: The inferior vena cava was not well visualized. IAS/Shunts: No atrial level shunt detected by color flow  Doppler. Agitated saline contrast was given intravenously to evaluate for intracardiac shunting. Agitated saline contrast bubble study was negative, with no evidence of any interatrial shunt. Additional Comments: A device lead is visualized in the right atrium and right ventricle.  LEFT VENTRICLE PLAX 2D LVIDd:         3.90 cm     Diastology LVIDs:         3.30 cm     LV e' medial:    6.20 cm/s LV PW:         1.00 cm     LV E/e' medial:  10.4 LV IVS:  1.00 cm     LV e' lateral:   9.68 cm/s LVOT diam:     2.00 cm     LV E/e' lateral: 6.7 LV SV:         50 LV SV Index:   29 LVOT Area:     3.14 cm  LV Volumes (MOD) LV vol d, MOD A2C: 35.1 ml LV vol d, MOD A4C: 57.9 ml LV vol s, MOD A2C: 24.3 ml LV vol s, MOD A4C: 23.0 ml LV SV MOD A2C:     10.8 ml LV SV MOD A4C:     57.9 ml LV SV MOD BP:      21.8 ml RIGHT VENTRICLE RV Basal diam:  3.40 cm RV Mid diam:    1.90 cm RV S prime:     17.10 cm/s TAPSE (M-mode): 1.3 cm LEFT ATRIUM              Index        RIGHT ATRIUM           Index LA Vol (A2C):   77.0 ml  45.05 ml/m  RA Area:     13.10 cm LA Vol (A4C):   128.0 ml 74.90 ml/m  RA Volume:   30.50 ml  17.85 ml/m LA Biplane Vol: 104.0 ml 60.85 ml/m  AORTIC VALVE                     PULMONIC VALVE AV Area (Vmax):    1.36 cm      PV Vmax:       0.81 m/s AV Area (Vmean):   1.37 cm      PV Peak grad:  2.7 mmHg AV Area (VTI):     1.37 cm AV Vmax:           181.00 cm/s AV Vmean:          133.000 cm/s AV VTI:            0.362 m AV Peak Grad:      13.1 mmHg AV Mean Grad:      8.0 mmHg LVOT Vmax:         78.30 cm/s LVOT Vmean:        57.800 cm/s LVOT VTI:          0.158 m LVOT/AV VTI ratio: 0.44  AORTA Ao Root diam: 3.30 cm MITRAL VALVE               TRICUSPID VALVE MV Area (PHT): 4.49 cm    TR Peak grad:   29.6 mmHg MV Decel Time: 169 msec    TR Vmax:        272.00 cm/s MR Peak grad: 59.4 mmHg MR Vmax:      385.25 cm/s  SHUNTS MV E velocity: 64.70 cm/s  Systemic VTI:  0.16 m MV A velocity: 35.10 cm/s  Systemic Diam:  2.00 cm MV E/A ratio:  1.84 Eleonore Chiquito MD Electronically signed by Eleonore Chiquito MD Signature Date/Time: 05/04/2022/11:21:27 AM    Final    VAS US CAROTID  Result Date: 05/04/2022 Carotid Arterial Duplex Study Patient Name:  RYLEN SWINDLER  Date of Exam:   05/04/2022 Medical Rec #: [id number]       Accession #:    [id number] Date of Birth: 11-14-1921       Patient Gender: M Patient Age:   8 years Exam Location:  [facility] Procedure:      VAS US CAROTID  Referring Phys: Janine Ores --------------------------------------------------------------------------------  Indications:       Speech disturbance and Visual disturbance. Risk Factors:      Hypertension, coronary artery disease. Comparison Study:  no prior Performing Technologist: Archie Patten RVS  Examination Guidelines: A complete evaluation includes B-mode imaging, spectral Doppler, color Doppler, and power Doppler as needed of all accessible portions of each vessel. Bilateral testing is considered an integral part of a complete examination. Limited examinations for reoccurring indications may be performed as noted.  Right Carotid Findings: +----------+--------+--------+--------+------------------+--------+           PSV cm/sEDV cm/sStenosisPlaque DescriptionComments +----------+--------+--------+--------+------------------+--------+ CCA Prox  58      12              heterogenous               +----------+--------+--------+--------+------------------+--------+ CCA Distal59      12              heterogenous               +----------+--------+--------+--------+------------------+--------+ ICA Prox  55      18      1-39%   heterogenous      tortuous +----------+--------+--------+--------+------------------+--------+ ICA Distal60      17                                         +----------+--------+--------+--------+------------------+--------+ ECA       88      7                                           +----------+--------+--------+--------+------------------+--------+ +----------+--------+-------+--------+-------------------+           PSV cm/sEDV cmsDescribeArm Pressure (mmHG) +----------+--------+-------+--------+-------------------+ [id number]                                         +----------+--------+-------+--------+-------------------+ +---------+--------+--+--------+--+---------+ VertebralPSV cm/s46EDV cm/s10Antegrade +---------+--------+--+--------+--+---------+  Left Carotid Findings: +----------+--------+--------+--------+------------------+--------+           PSV cm/sEDV cm/sStenosisPlaque DescriptionComments +----------+--------+--------+--------+------------------+--------+ CCA Prox  60      14              heterogenous               +----------+--------+--------+--------+------------------+--------+ CCA Distal68      14              heterogenous               +----------+--------+--------+--------+------------------+--------+ ICA Prox  52      12      1-39%   heterogenous               +----------+--------+--------+--------+------------------+--------+ ICA Distal65      21                                         +----------+--------+--------+--------+------------------+--------+ ECA       93                                                 +----------+--------+--------+--------+------------------+--------+ +----------+--------+--------+--------+-------------------+  PSV cm/sEDV cm/sDescribeArm Pressure (mmHG) +----------+--------+--------+--------+-------------------+ Subclavian88                                          +----------+--------+--------+--------+-------------------+ +---------+--------+--+--------+--+---------+ VertebralPSV cm/s42EDV cm/s13Antegrade +---------+--------+--+--------+--+---------+   Summary: Right Carotid: Velocities in the right ICA are consistent with a 1-39% stenosis. Left  Carotid: Velocities in the left ICA are consistent with a 1-39% stenosis. Vertebrals: Bilateral vertebral arteries demonstrate antegrade flow. *See table(s) above for measurements and observations.     Preliminary    DG Chest 1 View  Result Date: 05/03/2022 CLINICAL DATA:  [id number] Pneumonia [id number] EXAM: CHEST  1 VIEW COMPARISON:  Chest x-ray 01/26/2015 FINDINGS: Left chest wall 2 lead pacemaker in grossly appropriate position. The heart and mediastinal contours are unchanged. No focal consolidation. No pulmonary edema. No pleural effusion. No pneumothorax. No acute osseous abnormality. IMPRESSION: No active disease. Electronically Signed   By: Iven Finn M.D.   On: 05/03/2022 18:20   CT HEAD CODE STROKE WO CONTRAST  Result Date: 05/03/2022 CLINICAL DATA:  Code stroke. EXAM: CT HEAD WITHOUT CONTRAST TECHNIQUE: Contiguous axial images were obtained from the base of the skull through the vertex without intravenous contrast. RADIATION DOSE REDUCTION: This exam was performed according to the departmental dose-optimization program which includes automated exposure control, adjustment of the mA and/or kV according to patient size and/or use of iterative reconstruction technique. COMPARISON:  CT head 07/21/2012 FINDINGS: Brain: There is no acute intracranial hemorrhage, extra-axial fluid collection, or acute infarct. Parenchymal volume is normal for age. The ventricles are normal in size. Gray-white differentiation is preserved. There is a small remote infarct in the right caudate head and small remote infarcts in the bilateral cerebellar hemispheres, unchanged since 2014. There is no mass lesion.  There is no mass effect or midline shift. Vascular: No dense vessel is seen. There is calcification of the bilateral carotid siphons. Skull: Normal. Negative for fracture or focal lesion. Sinuses/Orbits: The paranasal sinuses are clear. Bilateral lens implants are in place. The globes and orbits are otherwise  unremarkable. Other: None. ASPECTS [facility] Stroke Program Early CT Score) - Ganglionic level infarction (caudate, lentiform nuclei, internal capsule, insula, M1-M3 cortex): 7 - Supraganglionic infarction (M4-M6 cortex): 3 Total score (0-10 with 10 being normal): 10 IMPRESSION: 1. No acute intracranial pathology. 2. ASPECTS is 10 Findings communicated to Dr. Rory Percy at 5:38 p.m. Electronically Signed   By: Valetta Mole M.D.   On: 05/03/2022 17:42        Scheduled Meds:  apixaban  5 mg Oral BID   carbidopa-levodopa  2 tablet Oral TID WC   cyanocobalamin  1,000 mcg Oral Daily   ezetimibe  10 mg Oral Daily   levothyroxine  75 mcg Oral QAC breakfast   midodrine  5 mg Oral TID WC   multivitamin with minerals  1 tablet Oral Daily   pravastatin  40 mg Oral Daily   Continuous Infusions:  sodium chloride 75 mL/hr at 05/05/22 2774          Aline August, MD [facility] 05/05/2022, 10:09 AM

## 2022-05-06 ENCOUNTER — Encounter (HOSPITAL_COMMUNITY): Payer: Self-pay | Admitting: Internal Medicine

## 2022-05-06 DIAGNOSIS — R531 Weakness: Secondary | ICD-10-CM | POA: Diagnosis not present

## 2022-05-06 DIAGNOSIS — I639 Cerebral infarction, unspecified: Secondary | ICD-10-CM | POA: Diagnosis not present

## 2022-05-06 DIAGNOSIS — Z7189 Other specified counseling: Secondary | ICD-10-CM | POA: Diagnosis not present

## 2022-05-06 MED ORDER — IBUPROFEN 200 MG PO TABS
400.0000 mg | ORAL_TABLET | Freq: Four times a day (QID) | ORAL | Status: DC | PRN
  Administered 2022-05-06: 400 mg via ORAL
  Filled 2022-05-06: qty 2

## 2022-05-06 MED ORDER — ORAL CARE MOUTH RINSE: 15.0000 mL | OROMUCOSAL | Status: DC | PRN

## 2022-05-06 MED ORDER — DICLOFENAC SODIUM 1 % EX GEL: 4.0000 g | Freq: Four times a day (QID) | CUTANEOUS | Status: DC | PRN

## 2022-05-06 MED ORDER — ORAL CARE MOUTH RINSE
15.0000 mL | OROMUCOSAL | Status: DC
  Administered 2022-05-06 – 2022-05-09 (×11): 15 mL via OROMUCOSAL

## 2022-05-06 MED ORDER — ACETAMINOPHEN 500 MG PO TABS
500.0000 mg | ORAL_TABLET | Freq: Four times a day (QID) | ORAL | Status: DC
  Administered 2022-05-06 – 2022-05-09 (×11): 500 mg via ORAL
  Filled 2022-05-06 (×11): qty 1

## 2022-05-06 NOTE — NC FL2 (Signed)
[place] LEVEL OF CARE SCREENING TOOL     IDENTIFICATION  Patient Name: Jerry Wood Birthdate: 1921-07-31 Sex: male Admission Date (Current Location): 05/03/2022  [facility] and [place] Number:  Herbalist and Address:  [facility] [place], [place] [address] [place], [place] [address]      Provider Number: [id number]  Attending Physician Name and Address:  Aline August, MD  Relative Name and Phone Number:  Silas Muff [phone]    Current Level of Care: Hospital Recommended Level of Care: [place] Prior Approval Number:    Date Approved/Denied:   PASRR Number: [id number]  Discharge Plan: SNF    Current Diagnoses: Patient Active Problem List   Diagnosis Date Noted   TIA (transient ischemic attack) 05/04/2022   Acute CVA (cerebrovascular accident) ([place]) 05/04/2022   Facial droop 05/04/2022   Left-sided weakness 05/03/2022   Pacemaker 02/07/2022   Tachycardia-bradycardia ([place]) 07/20/2013   Presence of permanent cardiac pacemaker 07/20/2013   Encounter for therapeutic drug monitoring 07/15/2013   HTN (hypertension) 06/05/2013   Coronary atherosclerosis of native coronary artery 06/03/2013   Long term (current) use of anticoagulants 06/03/2013   Edema 06/03/2013   Pure hypercholesterolemia 06/03/2013   Chronic atrial fibrillation ([place]) 04/15/2013   Parkinson disease 01/14/2013    Orientation RESPIRATION BLADDER Height & Weight     Self, Situation, Time, Place  Normal Incontinent, External catheter Weight: 153 lb 9.6 oz (69.7 kg) Height:     BEHAVIORAL SYMPTOMS/MOOD NEUROLOGICAL BOWEL NUTRITION STATUS      Continent Diet (See dc summary)  AMBULATORY STATUS COMMUNICATION OF NEEDS Skin   Total Care Verbally Normal                       Personal Care Assistance Level of Assistance  Bathing, Feeding, Dressing Bathing Assistance: Limited assistance Feeding assistance: Limited assistance Dressing  Assistance: Limited assistance     Functional Limitations Info  Sight, Hearing, Speech Sight Info: Adequate Hearing Info: Impaired Speech Info: Adequate    SPECIAL CARE FACTORS FREQUENCY  PT (By licensed PT), OT (By licensed OT)     PT Frequency: 3x week OT Frequency: 3x week            Contractures Contractures Info: Not present    Additional Factors Info  Code Status, Allergies Code Status Info: DNR Allergies Info: NKA           Current Medications (05/06/2022):  This is the current hospital active medication list Current Facility-Administered Medications  Medication Dose Route Frequency Provider Last Rate Last Admin   0.9 %  sodium chloride infusion   Intravenous Continuous Aline August, MD 75 mL/hr at 05/06/22 1150 New Bag at 05/06/22 1150   acetaminophen (TYLENOL) tablet 500 mg  500 mg Oral Q6H Alekh, Kshitiz, MD       apixaban (ELIQUIS) tablet 5 mg  5 mg Oral BID Irene Pap N, DO   5 mg at 05/06/22 0845   calcium carbonate (TUMS - dosed in mg elemental calcium) chewable tablet 200 mg of elemental calcium  1 tablet Oral Daily PRN Irene Pap N, DO       carbidopa-levodopa (SINEMET IR) 25-100 MG per tablet immediate release 2 tablet  2 tablet Oral TID WC Irene Pap N, DO   2 tablet at 05/06/22 1115   cyanocobalamin (VITAMIN B12) tablet 1,000 mcg  1,000 mcg Oral Daily Irene Pap N, DO   1,000 mcg at 05/06/22 [phone]  diclofenac Sodium (VOLTAREN) 1 % topical gel 4 g  4 g Topical QID PRN Aline August, MD       ezetimibe (ZETIA) tablet 10 mg  10 mg Oral Daily Irene Pap N, DO   10 mg at 05/06/22 0845   haloperidol lactate (HALDOL) injection 1 mg  1 mg Intramuscular Q6H PRN Mansy, Jan A, MD   1 mg at 05/05/22 0100   ibuprofen (ADVIL) tablet 400 mg  400 mg Oral Q6H PRN Aline August, MD   400 mg at 05/06/22 1145   labetalol (NORMODYNE) injection 5 mg  5 mg Intravenous Q2H PRN Kayleen Memos, DO       levothyroxine (SYNTHROID) tablet 75 mcg  75 mcg Oral QAC  breakfast Kayleen Memos, DO   75 mcg at 05/06/22 0845   LORazepam (ATIVAN) injection 0.5 mg  0.5 mg Intravenous Q4H PRN Mansy, Jan A, MD       melatonin tablet 5 mg  5 mg Oral QHS PRN Irene Pap N, DO       midodrine (PROAMATINE) tablet 5 mg  5 mg Oral TID WC Rosalin Hawking, MD   5 mg at 05/06/22 1115   multivitamin with minerals tablet 1 tablet  1 tablet Oral Daily Irene Pap N, DO   1 tablet at 05/06/22 0845   Oral care mouth rinse  15 mL Mouth Rinse 4 times per day Aline August, MD       Oral care mouth rinse  15 mL Mouth Rinse PRN Starla Link, Kshitiz, MD       polyethylene glycol (MIRALAX / GLYCOLAX) packet 17 g  17 g Oral Daily PRN Irene Pap N, DO       pravastatin (PRAVACHOL) tablet 40 mg  40 mg Oral Daily Irene Pap N, DO   40 mg at 05/06/22 0845   prochlorperazine (COMPAZINE) injection 5 mg  5 mg Intravenous Q6H PRN Kayleen Memos, DO         Discharge Medications: Please see discharge summary for a list of discharge medications.  Relevant Imaging Results:  Relevant Lab Results:   Additional Information SSN [id number]  Loreta Ave, LCSWA

## 2022-05-06 NOTE — Progress Notes (Signed)
PROGRESS NOTE    Jerry Wood  YCX:[id number] DOB: Oct 15, 1921 DOA: 05/03/2022 PCP: Pcp, No   Brief Narrative:  88 y.o. male with medical history significant for Parkinson's disease, permanent atrial fibrillation on Eliquis, hypertension, history of tachybradycardia syndrome status post pacemaker placement, coronary artery disease status post PCI to RCA, CKD 3B, nonambulatory, wheelchair dependent presented as a code stroke after he was found to have left facial droop and slurred speech along with dysphagia.  On presentation, CT head was nonacute.  Neurology was consulted.  Assessment & Plan:   Acute possibly embolic stroke presenting with left facial droop/left-sided weakness: Small scattered right MCA infarcts -Most likely embolic pattern likely due to A-fib despite being on Eliquis as per neurology -CT head was nonacute.  MRI brain showed CVA as above.  MRI negative.  Carotid Doppler unremarkable.  Echo showed EF of 55 to 60%.  A1c 5.1 LDL 52 -Neurology evaluation and follow-up appreciated.  Neurology signed off on 05/04/2022 and recommended outpatient follow-up with neurology and to continue Eliquis along with pravastatin and Zetia  Permanent A-fib -currently rate controlled -Continue Eliquis  History of tachybradycardia syndrome: Status post pacemaker placement -Outpatient follow-up with cardiology/EP  Hypotension -Possibly related to Parkinson disease.  Started on midodrine by neurology on 05/04/2022.  This will be continued on discharge as well  Hyperlipidemia -Plan as above  Hypothyroidism -Continue levothyroxine  Parkinson's disease -Continue Sinemet.  Outpatient follow-up with neurology  Anemia of chronic disease -Possibly from chronic illnesses.  Hemoglobin stable.  Monitor intermittently.  Physical deconditioning Goals of care -Palliative care following.  CODE STATUS has been changed to DNR.   -PT recommends home health PT versus SNF.  Apparently, family might  not be able to provide assistance at home and patient might need SNF.  DVT prophylaxis: Eliquis Code Status: DNR Family Communication: None at bedside Disposition Plan: Status is: Inpatient Remains inpatient appropriate because: Of severity of illness.  Might need SNF placement.  Consultants: Neurology/palliative care  Procedures: Echo  Antimicrobials: None   Subjective: Patient seen and examined at bedside.  Poor historian.  Extremely hard of hearing.  No agitation, seizures, vomiting reported.  Objective: Vitals:   05/05/22 1955 05/05/22 2329 05/06/22 0314 05/06/22 0500  BP: 102/88 112/69 (!) 94/58   Pulse: 74 98 89   Resp: '16 16 19   '$ Temp: 99.3 F (37.4 C) 98.3 F (36.8 C) 98.4 F (36.9 C)   TempSrc: Oral  Oral   SpO2: 95% 94% 92%   Weight:    69.7 kg    Intake/Output Summary (Last 24 hours) at 05/06/2022 0756 Last data filed at 05/06/2022 0014 Gross per 24 hour  Intake 887.5 ml  Output --  Net 887.5 ml    Filed Weights   05/03/22 1725 05/05/22 0450 05/06/22 0500  Weight: 71.7 kg 69.7 kg 69.7 kg    Examination:  General: On room air.  No distress.  Elderly male lying in bed.  Extremely hard of hearing.  Slow to respond.  Poor historian. respiratory: Decreased breath sounds at bases bilaterally with some crackles CVS: Currently rate controlled; S1-S2 heard  abdominal: Soft, nontender, slightly distended, no organomegaly; normal bowel sounds are heard  extremities: Trace lower extremity edema; no clubbing.    Data Reviewed: I have personally reviewed following labs and imaging studies  CBC: Recent Labs  Lab 05/03/22 1721 05/03/22 1725 05/04/22 0410  WBC 10.2  --  9.5  NEUTROABS 6.9  --   --   HGB 12.3*  12.6* 12.2*  HCT 37.9* 37.0* 36.9*  MCV 101.1*  --  98.9  PLT 248  --  588    Basic Metabolic Panel: Recent Labs  Lab 05/03/22 1721 05/03/22 1725 05/04/22 0410 05/05/22 0413 05/05/22 0414  NA 141 141  --  143  --   K 3.8 3.8  --  3.6  --    CL 103 102  --  109  --   CO2 26  --   --  22  --   GLUCOSE 109* 105*  --  107*  --   BUN 24* 25*  --  17  --   CREATININE 1.36* 1.40*  --  1.16  --   CALCIUM 9.1  --   --  8.8*  --   MG  --   --  2.2  --  2.0  PHOS  --   --   --  2.3*  --     GFR: Estimated Creatinine Clearance: 29.8 mL/min (by C-G formula based on SCr of 1.16 mg/dL). Liver Function Tests: Recent Labs  Lab 05/03/22 1721 05/05/22 0413  AST 22  --   ALT 6  --   ALKPHOS 108  --   BILITOT 0.7  --   PROT 6.5  --   ALBUMIN 3.6 3.0*    No results for input(s): "LIPASE", "AMYLASE" in the last 168 hours. No results for input(s): "AMMONIA" in the last 168 hours. Coagulation Profile: Recent Labs  Lab 05/03/22 1721  INR 1.2    Cardiac Enzymes: No results for input(s): "CKTOTAL", "CKMB", "CKMBINDEX", "TROPONINI" in the last 168 hours. BNP (last 3 results) No results for input(s): "PROBNP" in the last 8760 hours. HbA1C: Recent Labs    05/04/22 0410  HGBA1C 5.1    CBG: Recent Labs  Lab 05/03/22 1721  GLUCAP 108*    Lipid Profile: Recent Labs    05/05/22 0413  CHOL 99  HDL 40*  LDLCALC 52  TRIG 37  CHOLHDL 2.5    Thyroid Function Tests: No results for input(s): "TSH", "T4TOTAL", "FREET4", "T3FREE", "THYROIDAB" in the last 72 hours. Anemia Panel: No results for input(s): "VITAMINB12", "FOLATE", "FERRITIN", "TIBC", "IRON", "RETICCTPCT" in the last 72 hours. Sepsis Labs: No results for input(s): "PROCALCITON", "LATICACIDVEN" in the last 168 hours.  No results found for this or any previous visit (from the past 240 hour(s)).       Radiology Studies: MR BRAIN WO CONTRAST  Result Date: 05/04/2022 CLINICAL DATA:  Neuro deficit, acute, stroke suspected. Left facial droop and slurred speech. EXAM: MRI HEAD WITHOUT CONTRAST MRA HEAD WITHOUT CONTRAST TECHNIQUE: Multiplanar, multi-echo pulse sequences of the brain and surrounding structures were acquired without intravenous contrast.  Angiographic images of the Circle of Willis were acquired using MRA technique without intravenous contrast. COMPARISON:  Head CT 05/03/2022 FINDINGS: MRI HEAD FINDINGS Brain: Small acute right MCA territory cortical infarcts are present involving the insula and frontal lobe. Scattered small T2 hyperintensities in the cerebral white matter and pons are nonspecific but compatible with mild chronic small vessel ischemic disease. Chronic lacunar infarcts are noted in the caudate nuclei and cerebellar hemispheres bilaterally. Generalized cerebral atrophy is likely within normal limits for age. No intracranial hemorrhage, mass, midline shift, or extra-axial fluid collection is identified. Vascular: Major intracranial vascular flow voids are preserved. Skull and upper cervical spine: Unremarkable bone marrow signal. Sinuses/Orbits: Bilateral cataract extraction. Clear paranasal sinuses. Small mastoid effusions. Other: None. MRA HEAD FINDINGS Anterior circulation: The internal carotid arteries are  widely patent from skull base to carotid termini. ACAs and MCAs are patent without evidence of a proximal branch occlusion or significant proximal stenosis. No aneurysm is identified. Posterior circulation: The intracranial vertebral arteries are patent to the basilar with the left being dominant. The right vertebral artery is hypoplastic distal to the PICA origin. Patent PICA and SCA origins are seen bilaterally. The basilar artery is widely patent. Posterior communicating arteries are diminutive or absent. Both PCAs are patent without evidence of significant proximal stenosis. No aneurysm is identified. Anatomic variants: None. IMPRESSION: 1. Small acute right MCA infarcts. 2. Mild chronic small vessel ischemic disease. 3. Negative head MRA. Electronically Signed   By: Logan Bores M.D.   On: 05/04/2022 13:57   MR ANGIO HEAD WO CONTRAST  Result Date: 05/04/2022 CLINICAL DATA:  Neuro deficit, acute, stroke suspected. Left  facial droop and slurred speech. EXAM: MRI HEAD WITHOUT CONTRAST MRA HEAD WITHOUT CONTRAST TECHNIQUE: Multiplanar, multi-echo pulse sequences of the brain and surrounding structures were acquired without intravenous contrast. Angiographic images of the Circle of Willis were acquired using MRA technique without intravenous contrast. COMPARISON:  Head CT 05/03/2022 FINDINGS: MRI HEAD FINDINGS Brain: Small acute right MCA territory cortical infarcts are present involving the insula and frontal lobe. Scattered small T2 hyperintensities in the cerebral white matter and pons are nonspecific but compatible with mild chronic small vessel ischemic disease. Chronic lacunar infarcts are noted in the caudate nuclei and cerebellar hemispheres bilaterally. Generalized cerebral atrophy is likely within normal limits for age. No intracranial hemorrhage, mass, midline shift, or extra-axial fluid collection is identified. Vascular: Major intracranial vascular flow voids are preserved. Skull and upper cervical spine: Unremarkable bone marrow signal. Sinuses/Orbits: Bilateral cataract extraction. Clear paranasal sinuses. Small mastoid effusions. Other: None. MRA HEAD FINDINGS Anterior circulation: The internal carotid arteries are widely patent from skull base to carotid termini. ACAs and MCAs are patent without evidence of a proximal branch occlusion or significant proximal stenosis. No aneurysm is identified. Posterior circulation: The intracranial vertebral arteries are patent to the basilar with the left being dominant. The right vertebral artery is hypoplastic distal to the PICA origin. Patent PICA and SCA origins are seen bilaterally. The basilar artery is widely patent. Posterior communicating arteries are diminutive or absent. Both PCAs are patent without evidence of significant proximal stenosis. No aneurysm is identified. Anatomic variants: None. IMPRESSION: 1. Small acute right MCA infarcts. 2. Mild chronic small vessel  ischemic disease. 3. Negative head MRA. Electronically Signed   By: Logan Bores M.D.   On: 05/04/2022 13:57   ECHOCARDIOGRAM COMPLETE BUBBLE STUDY  Result Date: 05/04/2022    ECHOCARDIOGRAM REPORT   Patient Name:   ATHAN CASALINO Date of Exam: 05/04/2022 Medical Rec #:  [id number]      Height:       61.0 in Accession #:    [id number]     Weight:       158.1 lb Date of Birth:  18-Nov-1921      BSA:          1.709 m Patient Age:    40 years       BP:           80/53 mmHg Patient Gender: M              HR:           57 bpm. Exam Location:  Inpatient Procedure: 2D Echo and Saline Contrast Bubble Study Indications:    srtoke  History:  Patient has prior history of Echocardiogram examinations, most                 recent 02/01/2015. Pacemaker, Signs/Symptoms:Edema; Risk                 Factors:Hypertension.  Sonographer:    Harvie Junior Referring Phys: [id number] [place]  Sonographer Comments: Technically difficult study due to poor echo windows and no subcostal window. IMPRESSIONS  1. Left ventricular ejection fraction, by estimation, is 55 to 60%. The left ventricle has normal function. The left ventricle has no regional wall motion abnormalities. Left ventricular diastolic function could not be evaluated.  2. Right ventricular systolic function is normal. The right ventricular size is normal.  3. Left atrial size was severely dilated.  4. The mitral valve is grossly normal. Mild mitral valve regurgitation. No evidence of mitral stenosis.  5. The aortic valve is tricuspid. There is moderate calcification of the aortic valve. There is moderate thickening of the aortic valve. Aortic valve regurgitation is not visualized. Mild aortic valve stenosis.  6. Agitated saline contrast bubble study was negative, with no evidence of any interatrial shunt. Conclusion(s)/Recommendation(s): No intracardiac source of embolism detected on this transthoracic study. Consider a transesophageal echocardiogram to exclude cardiac  source of embolism if clinically indicated. FINDINGS  Left Ventricle: Left ventricular ejection fraction, by estimation, is 55 to 60%. The left ventricle has normal function. The left ventricle has no regional wall motion abnormalities. The left ventricular internal cavity size was normal in size. Suboptimal image quality limits for assessment of left ventricular hypertrophy. Left ventricular diastolic function could not be evaluated due to atrial fibrillation. Left ventricular diastolic function could not be evaluated. Right Ventricle: The right ventricular size is normal. No increase in right ventricular wall thickness. Right ventricular systolic function is normal. Left Atrium: Left atrial size was severely dilated. Right Atrium: Right atrial size was normal in size. Pericardium: Trivial pericardial effusion is present. Mitral Valve: The mitral valve is grossly normal. There is mild calcification of the anterior mitral valve leaflet(s). Mild mitral valve regurgitation. No evidence of mitral valve stenosis. Tricuspid Valve: The tricuspid valve is grossly normal. Tricuspid valve regurgitation is mild . No evidence of tricuspid stenosis. Aortic Valve: The aortic valve is tricuspid. There is moderate calcification of the aortic valve. There is moderate thickening of the aortic valve. Aortic valve regurgitation is not visualized. Mild aortic stenosis is present. Aortic valve mean gradient measures 8.0 mmHg. Aortic valve peak gradient measures 13.1 mmHg. Aortic valve area, by VTI measures 1.37 cm. Pulmonic Valve: The pulmonic valve was grossly normal. Pulmonic valve regurgitation is trivial. No evidence of pulmonic stenosis. Aorta: The aortic root and ascending aorta are structurally normal, with no evidence of dilitation. Venous: The inferior vena cava was not well visualized. IAS/Shunts: No atrial level shunt detected by color flow Doppler. Agitated saline contrast was given intravenously to evaluate for  intracardiac shunting. Agitated saline contrast bubble study was negative, with no evidence of any interatrial shunt. Additional Comments: A device lead is visualized in the right atrium and right ventricle.  LEFT VENTRICLE PLAX 2D LVIDd:         3.90 cm     Diastology LVIDs:         3.30 cm     LV e' medial:    6.20 cm/s LV PW:         1.00 cm     LV E/e' medial:  10.4 LV IVS:  1.00 cm     LV e' lateral:   9.68 cm/s LVOT diam:     2.00 cm     LV E/e' lateral: 6.7 LV SV:         50 LV SV Index:   29 LVOT Area:     3.14 cm  LV Volumes (MOD) LV vol d, MOD A2C: 35.1 ml LV vol d, MOD A4C: 57.9 ml LV vol s, MOD A2C: 24.3 ml LV vol s, MOD A4C: 23.0 ml LV SV MOD A2C:     10.8 ml LV SV MOD A4C:     57.9 ml LV SV MOD BP:      21.8 ml RIGHT VENTRICLE RV Basal diam:  3.40 cm RV Mid diam:    1.90 cm RV S prime:     17.10 cm/s TAPSE (M-mode): 1.3 cm LEFT ATRIUM              Index        RIGHT ATRIUM           Index LA Vol (A2C):   77.0 ml  45.05 ml/m  RA Area:     13.10 cm LA Vol (A4C):   128.0 ml 74.90 ml/m  RA Volume:   30.50 ml  17.85 ml/m LA Biplane Vol: 104.0 ml 60.85 ml/m  AORTIC VALVE                     PULMONIC VALVE AV Area (Vmax):    1.36 cm      PV Vmax:       0.81 m/s AV Area (Vmean):   1.37 cm      PV Peak grad:  2.7 mmHg AV Area (VTI):     1.37 cm AV Vmax:           181.00 cm/s AV Vmean:          133.000 cm/s AV VTI:            0.362 m AV Peak Grad:      13.1 mmHg AV Mean Grad:      8.0 mmHg LVOT Vmax:         78.30 cm/s LVOT Vmean:        57.800 cm/s LVOT VTI:          0.158 m LVOT/AV VTI ratio: 0.44  AORTA Ao Root diam: 3.30 cm MITRAL VALVE               TRICUSPID VALVE MV Area (PHT): 4.49 cm    TR Peak grad:   29.6 mmHg MV Decel Time: 169 msec    TR Vmax:        272.00 cm/s MR Peak grad: 59.4 mmHg MR Vmax:      385.25 cm/s  SHUNTS MV E velocity: 64.70 cm/s  Systemic VTI:  0.16 m MV A velocity: 35.10 cm/s  Systemic Diam: 2.00 cm MV E/A ratio:  1.84 Eleonore Chiquito MD Electronically signed by Eleonore Chiquito MD Signature Date/Time: 05/04/2022/11:21:27 AM    Final    VAS US CAROTID  Result Date: 05/04/2022 Carotid Arterial Duplex Study Patient Name:  QUINTEZ MASELLI  Date of Exam:   05/04/2022 Medical Rec #: [id number]       Accession #:    [id number] Date of Birth: May 30, 1922       Patient Gender: M Patient Age:   14 years Exam Location:  [facility] Procedure:      VAS US CAROTID  Referring Phys: Janine Ores --------------------------------------------------------------------------------  Indications:       Speech disturbance and Visual disturbance. Risk Factors:      Hypertension, coronary artery disease. Comparison Study:  no prior Performing Technologist: Archie Patten RVS  Examination Guidelines: A complete evaluation includes B-mode imaging, spectral Doppler, color Doppler, and power Doppler as needed of all accessible portions of each vessel. Bilateral testing is considered an integral part of a complete examination. Limited examinations for reoccurring indications may be performed as noted.  Right Carotid Findings: +----------+--------+--------+--------+------------------+--------+           PSV cm/sEDV cm/sStenosisPlaque DescriptionComments +----------+--------+--------+--------+------------------+--------+ CCA Prox  58      12              heterogenous               +----------+--------+--------+--------+------------------+--------+ CCA Distal59      12              heterogenous               +----------+--------+--------+--------+------------------+--------+ ICA Prox  55      18      1-39%   heterogenous      tortuous +----------+--------+--------+--------+------------------+--------+ ICA Distal60      17                                         +----------+--------+--------+--------+------------------+--------+ ECA       88      7                                          +----------+--------+--------+--------+------------------+--------+  +----------+--------+-------+--------+-------------------+           PSV cm/sEDV cmsDescribeArm Pressure (mmHG) +----------+--------+-------+--------+-------------------+ [id number]                                         +----------+--------+-------+--------+-------------------+ +---------+--------+--+--------+--+---------+ VertebralPSV cm/s46EDV cm/s10Antegrade +---------+--------+--+--------+--+---------+  Left Carotid Findings: +----------+--------+--------+--------+------------------+--------+           PSV cm/sEDV cm/sStenosisPlaque DescriptionComments +----------+--------+--------+--------+------------------+--------+ CCA Prox  60      14              heterogenous               +----------+--------+--------+--------+------------------+--------+ CCA Distal68      14              heterogenous               +----------+--------+--------+--------+------------------+--------+ ICA Prox  52      12      1-39%   heterogenous               +----------+--------+--------+--------+------------------+--------+ ICA Distal65      21                                         +----------+--------+--------+--------+------------------+--------+ ECA       93                                                 +----------+--------+--------+--------+------------------+--------+ +----------+--------+--------+--------+-------------------+  PSV cm/sEDV cm/sDescribeArm Pressure (mmHG) +----------+--------+--------+--------+-------------------+ Subclavian88                                          +----------+--------+--------+--------+-------------------+ +---------+--------+--+--------+--+---------+ VertebralPSV cm/s42EDV cm/s13Antegrade +---------+--------+--+--------+--+---------+   Summary: Right Carotid: Velocities in the right ICA are consistent with a 1-39% stenosis. Left Carotid: Velocities in the left ICA are consistent with a 1-39% stenosis.  Vertebrals: Bilateral vertebral arteries demonstrate antegrade flow. *See table(s) above for measurements and observations.     Preliminary         Scheduled Meds:  apixaban  5 mg Oral BID   carbidopa-levodopa  2 tablet Oral TID WC   cyanocobalamin  1,000 mcg Oral Daily   ezetimibe  10 mg Oral Daily   levothyroxine  75 mcg Oral QAC breakfast   midodrine  5 mg Oral TID WC   multivitamin with minerals  1 tablet Oral Daily   pravastatin  40 mg Oral Daily   Continuous Infusions:  sodium chloride 75 mL/hr at 05/06/22 0014          Aline August, MD [facility] 05/06/2022, 7:56 AM

## 2022-05-06 NOTE — NC FL2 (Signed)
[place] LEVEL OF CARE SCREENING TOOL     IDENTIFICATION  Patient Name: Jerry Wood Birthdate: 04-28-1922 Sex: male Admission Date (Current Location): 05/03/2022  [facility] and [place] Number:  Herbalist and Address:  [facility], [place] [address] [place], [place] [address]      Provider Number: [id number]  Attending Physician Name and Address:  Aline August, MD  Relative Name and Phone Number:  Ngai Parcell [phone]    Current Level of Care: Hospital Recommended Level of Care: [place] Prior Approval Number:    Date Approved/Denied:   PASRR Number: [id number]  Discharge Plan: SNF    Current Diagnoses: Patient Active Problem List   Diagnosis Date Noted   TIA (transient ischemic attack) 05/04/2022   Acute CVA (cerebrovascular accident) ([place]) 05/04/2022   Facial droop 05/04/2022   Left-sided weakness 05/03/2022   Pacemaker 02/07/2022   Tachycardia-bradycardia ([place]) 07/20/2013   Presence of permanent cardiac pacemaker 07/20/2013   Encounter for therapeutic drug monitoring 07/15/2013   HTN (hypertension) 06/05/2013   Coronary atherosclerosis of native coronary artery 06/03/2013   Long term (current) use of anticoagulants 06/03/2013   Edema 06/03/2013   Pure hypercholesterolemia 06/03/2013   Chronic atrial fibrillation ([place]) 04/15/2013   Parkinson disease 01/14/2013    Orientation RESPIRATION BLADDER Height & Weight     Self, Situation, Time, Place  Normal Incontinent, External catheter Weight: 153 lb 9.6 oz (69.7 kg) Height:     BEHAVIORAL SYMPTOMS/MOOD NEUROLOGICAL BOWEL NUTRITION STATUS      Continent Diet (See dc summary)  AMBULATORY STATUS COMMUNICATION OF NEEDS Skin   Total Care Verbally Normal                       Personal Care Assistance Level of Assistance  Bathing, Feeding, Dressing Bathing Assistance: Limited assistance Feeding assistance: Limited assistance Dressing  Assistance: Limited assistance     Functional Limitations Info  Sight, Hearing, Speech Sight Info: Adequate Hearing Info: Impaired Speech Info: Adequate    SPECIAL CARE FACTORS FREQUENCY  PT (By licensed PT), OT (By licensed OT)     PT Frequency: 3x week OT Frequency: 3x week            Contractures Contractures Info: Not present    Additional Factors Info  Code Status, Allergies Code Status Info: DNR Allergies Info: NKA           Current Medications (05/06/2022):  This is the current hospital active medication list Current Facility-Administered Medications  Medication Dose Route Frequency Provider Last Rate Last Admin   0.9 %  sodium chloride infusion   Intravenous Continuous Aline August, MD 75 mL/hr at 05/06/22 1150 New Bag at 05/06/22 1150   acetaminophen (TYLENOL) tablet 500 mg  500 mg Oral Q6H Alekh, Kshitiz, MD       apixaban (ELIQUIS) tablet 5 mg  5 mg Oral BID Irene Pap N, DO   5 mg at 05/06/22 0845   calcium carbonate (TUMS - dosed in mg elemental calcium) chewable tablet 200 mg of elemental calcium  1 tablet Oral Daily PRN Irene Pap N, DO       carbidopa-levodopa (SINEMET IR) 25-100 MG per tablet immediate release 2 tablet  2 tablet Oral TID WC Irene Pap N, DO   2 tablet at 05/06/22 1115   cyanocobalamin (VITAMIN B12) tablet 1,000 mcg  1,000 mcg Oral Daily Irene Pap N, DO   1,000 mcg at 05/06/22 [phone]  diclofenac Sodium (VOLTAREN) 1 % topical gel 4 g  4 g Topical QID PRN Aline August, MD       ezetimibe (ZETIA) tablet 10 mg  10 mg Oral Daily Irene Pap N, DO   10 mg at 05/06/22 0845   haloperidol lactate (HALDOL) injection 1 mg  1 mg Intramuscular Q6H PRN Mansy, Jan A, MD   1 mg at 05/05/22 0100   ibuprofen (ADVIL) tablet 400 mg  400 mg Oral Q6H PRN Aline August, MD   400 mg at 05/06/22 1145   labetalol (NORMODYNE) injection 5 mg  5 mg Intravenous Q2H PRN Kayleen Memos, DO       levothyroxine (SYNTHROID) tablet 75 mcg  75 mcg Oral QAC  breakfast Kayleen Memos, DO   75 mcg at 05/06/22 0845   LORazepam (ATIVAN) injection 0.5 mg  0.5 mg Intravenous Q4H PRN Mansy, Jan A, MD       melatonin tablet 5 mg  5 mg Oral QHS PRN Irene Pap N, DO       midodrine (PROAMATINE) tablet 5 mg  5 mg Oral TID WC Rosalin Hawking, MD   5 mg at 05/06/22 1115   multivitamin with minerals tablet 1 tablet  1 tablet Oral Daily Irene Pap N, DO   1 tablet at 05/06/22 0845   Oral care mouth rinse  15 mL Mouth Rinse 4 times per day Aline August, MD       Oral care mouth rinse  15 mL Mouth Rinse PRN Starla Link, Kshitiz, MD       polyethylene glycol (MIRALAX / GLYCOLAX) packet 17 g  17 g Oral Daily PRN Irene Pap N, DO       pravastatin (PRAVACHOL) tablet 40 mg  40 mg Oral Daily Irene Pap N, DO   40 mg at 05/06/22 0845   prochlorperazine (COMPAZINE) injection 5 mg  5 mg Intravenous Q6H PRN Kayleen Memos, DO         Discharge Medications: Please see discharge summary for a list of discharge medications.  Relevant Imaging Results:  Relevant Lab Results:   Additional Information SSN [id number]  Loreta Ave, LCSWA

## 2022-05-06 NOTE — TOC Initial Note (Signed)
Transition of Care [facility]) - Initial/Assessment Note    Patient Details  Name: Jerry Wood MRN: [id number] Date of Birth: 01-07-1922  Transition of Care [facility]) CM/SW Contact:    Loreta Ave, [place] Phone Number: 05/06/2022, 1:03 PM  Clinical Narrative:                  CSW spoke with pt's daughter [name], she states she would like pt to be placed at [employer] for [employer] as her granddaughter works there as a Quarry manager. All questions answered, Medicare.gov and insurance auth process information provided.         Patient Goals and CMS Choice        Expected Discharge Plan and Services                                                Prior Living Arrangements/Services                       Activities of Daily Living      Permission Sought/Granted                  Emotional Assessment              Admission diagnosis:  TIA (transient ischemic attack) [G45.9] Weakness [R53.1] Facial droop [R29.810] Left-sided weakness [R53.1] Patient Active Problem List   Diagnosis Date Noted   TIA (transient ischemic attack) 05/04/2022   Acute CVA (cerebrovascular accident) ([place]) 05/04/2022   Facial droop 05/04/2022   Left-sided weakness 05/03/2022   Pacemaker 02/07/2022   Tachycardia-bradycardia ([place]) 07/20/2013   Presence of permanent cardiac pacemaker 07/20/2013   Encounter for therapeutic drug monitoring 07/15/2013   HTN (hypertension) 06/05/2013   Coronary atherosclerosis of native coronary artery 06/03/2013   Long term (current) use of anticoagulants 06/03/2013   Edema 06/03/2013   Pure hypercholesterolemia 06/03/2013   Chronic atrial fibrillation ([place]) 04/15/2013   Parkinson disease 01/14/2013   PCP:  Pcp, No Pharmacy:   [facility] [place] [place] [facility] [address] [place] [address] Phone: [phone] Fax: [phone]     Social Determinants of  Health (SDOH) Interventions    Readmission Risk Interventions     No data to display

## 2022-05-06 NOTE — Progress Notes (Signed)
Daily Progress Note   Patient Name: Jerry Wood       Date: 05/06/2022 DOB: 11-09-1921  Age: 88 y.o. MRN#: [id number] Attending Physician: Aline August, MD Primary Care Physician: Pcp, No Admit Date: 05/03/2022  Reason for Consultation/Follow-up: Establishing goals of care  Subjective: Medical records reviewed including progress notes, labs, imaging. Patient assessed at the bedside.  He reports feeling good today.  His daughter Jerry Wood is present visiting.  Created space and opportunity for daughter's thoughts and feelings on patient's current illness.  Emotional support and therapeutic listening was provided as she shared her frustrations.  She could not sleep last night, as she was very worried about how patient performed mobility exercises with PT and nursing yesterday.  She tells me he could not stand without panicking and stating repeatedly "I am going to fall."  Given this, she feels that pursuing SNF at this time would be the safest option for him.    Emphasized the importance of anticipatory care planning and beginning to make arrangements in the event that patient is not back to baseline after short-term rehab stay. She remains clear that patient will return home, even if he needs hospice after trial of rehab.  She hopes he can be home by Christmas.  Her daughter works at [employer] as a Quarry manager and she feels he would be agreeable if he knows this.  Provided her with updates on patient's MOST form currently on file and offered to complete a new one if she wishes to make any updates.  Questions and concerns addressed. PMT will continue to support holistically.   Length of Stay: 2  Physical Exam Vitals and nursing note reviewed.  Constitutional:      General: He is not in acute  distress.    Comments: Hard of hearing  Cardiovascular:     Rate and Rhythm: Normal rate.  Pulmonary:     Effort: Pulmonary effort is normal.  Skin:    General: Skin is warm and dry.  Neurological:     Mental Status: He is alert. Mental status is at baseline.  Psychiatric:        Mood and Affect: Mood normal.        Behavior: Behavior normal.   Vital Signs: BP (!) 107/54   Pulse 72   Temp 98.4 F (  36.9 C) (Oral)   Resp 19   Wt 69.7 kg   SpO2 95%   BMI 29.02 kg/m  SpO2: SpO2: 95 % O2 Device: O2 Device: Room Air O2 Flow Rate:        Palliative Assessment/Data: 40-50%    Palliative Care Assessment & Plan   Patient Profile: 88 y.o. male  with past medical history of Parkinson's disease, permanent atrial fibrillation on Eliquis, hypertension, history of tachybradycardia syndrome status post pacemaker placement, heart coronary artery disease status post PCI to RCA, and CKD 3B presented to ED on 05/03/22 from home with left facial droop and left sided weakness. Patient was admitted on 05/03/2022 with left facial droop/left sided weakness with concern for TIA/CVA. CT head was non-acute.  Assessment: Goals of care conversation Parkinson's disease A-fib on Eliquis CKD 3 Scattered small right MCA infarcts, acute Debility  Recommendations/Plan: Continue DNR Continue current care Patient's daughter now desires to pursue SNF with outpatient palliative care follow-up at discharge Psychosocial and emotional support provided PMT will continue to follow and support   Prognosis:  Unable to determine  Discharge Planning: SNF with palliative f/u  Care plan was discussed with patient, patient's daughter, TOC, Dr. Starla Link   MDM high         Larayne Baxley [name], PA-C  Palliative Medicine Team Team phone # [phone]  Thank you for allowing the Palliative Medicine Team to assist in the care of this patient. Please utilize secure chat with additional questions, if there is  no response within 30 minutes please call the above phone number.  Palliative Medicine Team providers are available by phone from 7am to 7pm daily and can be reached through the team cell phone.  Should this patient require assistance outside of these hours, please call the patient's attending physician.

## 2022-05-07 ENCOUNTER — Inpatient Hospital Stay (HOSPITAL_COMMUNITY): Payer: Medicare Other

## 2022-05-07 DIAGNOSIS — R531 Weakness: Secondary | ICD-10-CM | POA: Diagnosis not present

## 2022-05-07 DIAGNOSIS — I639 Cerebral infarction, unspecified: Secondary | ICD-10-CM | POA: Diagnosis not present

## 2022-05-07 MED ORDER — STROKE: EARLY STAGES OF RECOVERY BOOK: Freq: Once | Status: AC

## 2022-05-07 NOTE — Progress Notes (Addendum)
Spoke to pt's dtrs to provide current SNF offers. [place] is [place] with [employer], dtrs aware and will review available options and update SW with choice today.   [employer] auth request submited ref# [id number], will need to update them with facility choice when known.  Wandra Feinstein, MSW, LCSW [phone] (coverage)

## 2022-05-07 NOTE — Progress Notes (Addendum)
Speech Language Pathology Treatment: Dysphagia  Patient Details Name: Jerry Wood MRN: [id number] DOB: 06/03/1922 Today's Date: 05/07/2022 Time: 7628-3151 SLP Time Calculation (min) (ACUTE ONLY): 15 min  Assessment / Plan / Recommendation Clinical Impression  Pt seen for dysphagia treatment following recommendation of small sips via cup no straw during initial assessment. Pt alert and repositioned to upright. He is exhibiting signs of potential airway intrusion with immediate cough following thin despite using swallow strategies. Although cough was not consistent and present with one trial, an instrumental test with MBS is recommended to further evaluate swallow integrity and use of strategies as needed. Regular texture was masticated and transited without difficulty. Plan for MBS as radiology has availability and will update RN once scheduled. Continue thin and regular texture, meds in puree. ADDENDUM: MBS scheduled for 1:00 today    HPI HPI: Pt is a 88 y/o male who presented with L facial droop and slurred speech. CT head negative, MRI brain pending pacemaker compatibility. PMH: Parkinson's, COPD, a fib, HTN, PPM, CAD, CKD3      SLP Plan  New goals to be determined pending instrumental study      Recommendations for follow up therapy are one component of a multi-disciplinary discharge planning process, led by the attending physician.  Recommendations may be updated based on patient status, additional functional criteria and insurance authorization.    Recommendations  Diet recommendations: Regular;Thin liquid Liquids provided via: Straw;No straw Medication Administration: Whole meds with puree Supervision: Staff to assist with self feeding;Full supervision/cueing for compensatory strategies Compensations: Slow rate;Small sips/bites Postural Changes and/or Swallow Maneuvers: Seated upright 90 degrees                Oral Care Recommendations: Oral care BID Follow Up  Recommendations: Skilled nursing-short term rehab (<3 hours/day) Assistance recommended at discharge: Frequent or constant Supervision/Assistance SLP Visit Diagnosis: Dysphagia, unspecified (R13.10) Plan: New goals to be determined pending instrumental study           Houston Siren  05/07/2022, 9:30 AM

## 2022-05-07 NOTE — Progress Notes (Signed)
Mobility Specialist: Progress Note   05/07/22 1150  Mobility  Activity Transferred from chair to bed  Level of Assistance +2 (takes two people)  Assistive Device Stedy  Activity Response Tolerated well  Mobility Referral Yes  $Mobility charge 1 Mobility   Pt assisted back to bed per request with help from NT. ModA to stand from the chair, no c/o throughout. Assisted with pericare and then back to bed. Pt has call bell and phone at his side. Bed alarm is on.   [place] Sheyli Horwitz Mobility Specialist Please contact via SecureChat or Rehab office at [phone]

## 2022-05-07 NOTE — Progress Notes (Signed)
Physical Therapy Treatment Patient Details Name: Jerry Wood MRN: [id number] DOB: Aug 13, 1921 Today's Date: 05/07/2022   History of Present Illness Pt is a 88 y/o male who presented with L facial droop and slurred speech. CT head negative, MRI brain showing small R MCA infarcts. PMH: Parkinson's, a fib, HTN, PPM, CAD, CKD3    PT Comments    Patient alert and cooperative throughout session. Able to progress to standing with RW x 2 reps, however pt unable to perform pre-gait tasks. Returned to sitting and removed RW to allow closer assistance and pt transferred via stand-pivot to his right with +2 mod assist with belt and bed pad under hips/pelvis). Updated Chloe, NT and recommended use of stedy for back to bed transfer.    Recommendations for follow up therapy are one component of a multi-disciplinary discharge planning process, led by the attending physician.  Recommendations may be updated based on patient status, additional functional criteria and insurance authorization.  Follow Up Recommendations  Skilled nursing-short term rehab (<3 hours/day) Can patient physically be transported by private vehicle: No   Assistance Recommended at Discharge Frequent or constant Supervision/Assistance  Patient can return home with the following A lot of help with bathing/dressing/bathroom;Assistance with cooking/housework;Assist for transportation;Help with stairs or ramp for entrance;Two people to help with walking and/or transfers   Equipment Recommendations  None recommended by PT    Recommendations for Other Services       Precautions / Restrictions Precautions Precautions: Fall;Other (comment) Precaution Comments: impaired L shoulder ROM     Mobility  Bed Mobility Overal bed mobility: Needs Assistance Bed Mobility: Rolling, Sidelying to Sit Rolling: Mod assist Sidelying to sit: Mod assist, HOB elevated       General bed mobility comments: pt initiates by bending knee and reaching  toward rail; assist required to roll far enough to grasp rail and pt then pulls self over; assist to move feet over EOB and to raise torso to upright sitting    Transfers Overall transfer level: Needs assistance Equipment used: None Transfers: Bed to chair/wheelchair/BSC, Sit to/from Stand Sit to Stand: +2 physical assistance Stand pivot transfers: Mod assist, +2 physical assistance         General transfer comment: Stood from EOB x 2 reps with pt pulling on RW (he is used to pulling up on his power chair); pt unable to advance feet to pivot or step sideways along EOB; stand-pivot without RW with use of bed pad under pelvis to assist with bringing pt's hips under him in standing and then pivot to chair    Ambulation/Gait             Pre-gait activities: unable to complete despite trial     Stairs             Wheelchair Mobility    Modified Rankin (Stroke Patients Only) Modified Rankin (Stroke Patients Only) Pre-Morbid Rankin Score: Moderately severe disability Modified Rankin: Severe disability     Balance Overall balance assessment: Needs assistance Sitting-balance support: No upper extremity supported, Feet supported Sitting balance-Leahy Scale: Poor Sitting balance - Comments: tendency towards posterior lean   Standing balance support: Bilateral upper extremity supported, During functional activity Standing balance-Leahy Scale: Poor                              Cognition Arousal/Alertness: Awake/alert Behavior During Therapy: WFL for tasks assessed/performed Overall Cognitive Status: Within Functional Limits for tasks assessed  General Comments: pleasant, follows commands, limited due to [facility] at times but overall [facility]        Exercises General Exercises - Lower Extremity Heel Slides: AAROM, Both, 5 reps, Supine    General Comments        Pertinent Vitals/Pain Pain Assessment Pain  Assessment: Faces Faces Pain Scale: No hurt    Home Living                          Prior Function            PT Goals (current goals can now be found in the care plan section) Acute Rehab PT Goals Patient Stated Goal: to go home Time For Goal Achievement: 05/18/22 Potential to Achieve Goals: Good Progress towards PT goals: Progressing toward goals    Frequency    Min 2X/week      PT Plan Current plan remains appropriate;Frequency needs to be updated    Co-evaluation              AM-PAC PT "6 Clicks" Mobility   Outcome Measure  Help needed turning from your back to your side while in a flat bed without using bedrails?: A Lot Help needed moving from lying on your back to sitting on the side of a flat bed without using bedrails?: A Lot Help needed moving to and from a bed to a chair (including a wheelchair)?: Total Help needed standing up from a chair using your arms (e.g., wheelchair or bedside chair)?: Total Help needed to walk in hospital room?: Total Help needed climbing 3-5 steps with a railing? : Total 6 Click Score: 8    End of Session Equipment Utilized During Treatment: Gait belt Activity Tolerance: Patient tolerated treatment well Patient left: with call bell/phone within reach;in chair;with chair alarm set Nurse Communication: Mobility status;Need for lift equipment (recommend use of stedy) PT Visit Diagnosis: Unsteadiness on feet (R26.81);Muscle weakness (generalized) (M62.81);Dizziness and giddiness (R42)     Time: 0388-8280 PT Time Calculation (min) (ACUTE ONLY): 24 min  Charges:  $Therapeutic Activity: 23-37 mins                      Arby Barrette, PT Acute Rehabilitation Services  Office [phone]    Rexanne Mano 05/07/2022, 12:07 PM

## 2022-05-07 NOTE — Progress Notes (Signed)
PROGRESS NOTE    Jerry Wood  QHU:[id number] DOB: 1921-08-26 DOA: 05/03/2022 PCP: Pcp, No   Brief Narrative:  88 y.o. male with medical history significant for Parkinson's disease, permanent atrial fibrillation on Eliquis, hypertension, history of tachybradycardia syndrome status post pacemaker placement, coronary artery disease status post PCI to RCA, CKD 3B, nonambulatory, wheelchair dependent presented as a code stroke after he was found to have left facial droop and slurred speech along with dysphagia.  On presentation, CT head was nonacute.  Neurology recommended to continue Eliquis without significant outpatient follow-up with neurology.  Family pursuing SNF placement.  Assessment & Plan:   Acute possibly embolic stroke presenting with left facial droop/left-sided weakness: Small scattered right MCA infarcts -Most likely embolic pattern likely due to A-fib despite being on Eliquis as per neurology -CT head was nonacute.  MRI brain showed CVA as above.  MRI negative.  Carotid Doppler unremarkable.  Echo showed EF of 55 to 60%.  A1c 5.1 LDL 52 -Neurology evaluation and follow-up appreciated.  Neurology signed off on 05/04/2022 and recommended outpatient follow-up with neurology and to continue Eliquis along with pravastatin and Zetia  Permanent A-fib -currently rate controlled -Continue Eliquis  History of tachybradycardia syndrome: Status post pacemaker placement -Outpatient follow-up with cardiology/EP  Hypotension -Possibly related to Parkinson disease.  Started on midodrine by neurology on 05/04/2022.  This will be continued on discharge as well  Hyperlipidemia -Plan as above  Hypothyroidism -Continue levothyroxine  Parkinson's disease -Continue Sinemet.  Outpatient follow-up with neurology  Anemia of chronic disease -Possibly from chronic illnesses.  Hemoglobin stable.  Monitor intermittently.  Physical deconditioning Goals of care -Palliative care following.   CODE STATUS has been changed to DNR.   - Apparently, family might not be able to provide assistance at home and patient might need SNF.  TOC following  DVT prophylaxis: Eliquis Code Status: DNR Family Communication: None at bedside Disposition Plan: Status is: Inpatient Remains inpatient appropriate because: Of severity of illness.  Might need SNF placement.  Currently stable for discharge to SNF.  Consultants: Neurology/palliative care  Procedures: Echo  Antimicrobials: None   Subjective: Patient seen and examined at bedside.  Poor historian.  Extremely hard of hearing.  No seizures, vomiting, agitation reported.  Objective: Vitals:   05/06/22 2034 05/07/22 0008 05/07/22 0351 05/07/22 0736  BP: (!) 91/57 (!) 88/57 106/61 114/72  Pulse: 69 71 96 90  Resp: '17 16 16 18  '$ Temp: 98.3 F (36.8 C) 98.9 F (37.2 C) 98.6 F (37 C) 98.5 F (36.9 C)  TempSrc: Oral Oral Oral Oral  SpO2: 97% 99% 98% 97%  Weight:        Intake/Output Summary (Last 24 hours) at 05/07/2022 0749 Last data filed at 05/07/2022 0116 Gross per 24 hour  Intake 1991.93 ml  Output 150 ml  Net 1841.93 ml    Filed Weights   05/03/22 1725 05/05/22 0450 05/06/22 0500  Weight: 71.7 kg 69.7 kg 69.7 kg    Examination:  General: No distress currently.  Still on room air.  Rate elderly male lying in bed.  Extremely hard of hearing.  Slow to respond.  Poor historian. respiratory: Bilateral decreased breath sounds at bases with scattered crackles CVS: S1 and S2 are heard; rate controlled currently abdominal: Soft, nontender, still distended; no organomegaly; bowel sounds heard  extremities: No cyanosis; mild lower extremity edema present  Data Reviewed: I have personally reviewed following labs and imaging studies  CBC: Recent Labs  Lab 05/03/22 1721 05/03/22  1725 05/04/22 0410  WBC 10.2  --  9.5  NEUTROABS 6.9  --   --   HGB 12.3* 12.6* 12.2*  HCT 37.9* 37.0* 36.9*  MCV 101.1*  --  98.9  PLT 248   --  924    Basic Metabolic Panel: Recent Labs  Lab 05/03/22 1721 05/03/22 1725 05/04/22 0410 05/05/22 0413 05/05/22 0414  NA 141 141  --  143  --   K 3.8 3.8  --  3.6  --   CL 103 102  --  109  --   CO2 26  --   --  22  --   GLUCOSE 109* 105*  --  107*  --   BUN 24* 25*  --  17  --   CREATININE 1.36* 1.40*  --  1.16  --   CALCIUM 9.1  --   --  8.8*  --   MG  --   --  2.2  --  2.0  PHOS  --   --   --  2.3*  --     GFR: Estimated Creatinine Clearance: 29.8 mL/min (by C-G formula based on SCr of 1.16 mg/dL). Liver Function Tests: Recent Labs  Lab 05/03/22 1721 05/05/22 0413  AST 22  --   ALT 6  --   ALKPHOS 108  --   BILITOT 0.7  --   PROT 6.5  --   ALBUMIN 3.6 3.0*    No results for input(s): "LIPASE", "AMYLASE" in the last 168 hours. No results for input(s): "AMMONIA" in the last 168 hours. Coagulation Profile: Recent Labs  Lab 05/03/22 1721  INR 1.2    Cardiac Enzymes: No results for input(s): "CKTOTAL", "CKMB", "CKMBINDEX", "TROPONINI" in the last 168 hours. BNP (last 3 results) No results for input(s): "PROBNP" in the last 8760 hours. HbA1C: No results for input(s): "HGBA1C" in the last 72 hours.  CBG: Recent Labs  Lab 05/03/22 1721  GLUCAP 108*    Lipid Profile: Recent Labs    05/05/22 0413  CHOL 99  HDL 40*  LDLCALC 52  TRIG 37  CHOLHDL 2.5    Thyroid Function Tests: No results for input(s): "TSH", "T4TOTAL", "FREET4", "T3FREE", "THYROIDAB" in the last 72 hours. Anemia Panel: No results for input(s): "VITAMINB12", "FOLATE", "FERRITIN", "TIBC", "IRON", "RETICCTPCT" in the last 72 hours. Sepsis Labs: No results for input(s): "PROCALCITON", "LATICACIDVEN" in the last 168 hours.  No results found for this or any previous visit (from the past 240 hour(s)).       Radiology Studies: No results found.      Scheduled Meds:  acetaminophen  500 mg Oral Q6H   apixaban  5 mg Oral BID   carbidopa-levodopa  2 tablet Oral TID WC    cyanocobalamin  1,000 mcg Oral Daily   ezetimibe  10 mg Oral Daily   levothyroxine  75 mcg Oral QAC breakfast   midodrine  5 mg Oral TID WC   multivitamin with minerals  1 tablet Oral Daily   mouth rinse  15 mL Mouth Rinse 4 times per day   pravastatin  40 mg Oral Daily   Continuous Infusions:  sodium chloride 75 mL/hr at 05/07/22 0119          Aline August, MD [facility] 05/07/2022, 7:49 AM

## 2022-05-07 NOTE — Progress Notes (Signed)
Modified Barium Swallow Progress Note  Patient Details  Name: Jerry Wood MRN: [id number] Date of Birth: 1922-01-12  Today's Date: 05/07/2022  Modified Barium Swallow completed.  Full report located under Chart Review in the Imaging Section.  Brief recommendations include the following:  Clinical Impression  Pt presents with moderate dysphagia with deficits predominately in the pharyngeal phase. Reduced epiglottic deflection noted across all consistencies, which led to penetration of puree consistency and nectar thick liquids from cup (PAS 3). With thin liquids, the reduced epiglottic deflection led to penetration and silent aspiration (PAS 3, 8) during the swallow. Mild pharyngeal residue in the valleculae and pyriform sinuses intermittently noted with both liquid consistencies via cup sip, however, with nectar thick liquids, the residue spilled into the laryngeal vestibule post swallow (PAS 3). Pt did not sense any penetrated or aspirated material, however, when cued to clear throat/cough the remaining material was cleared from airway. Nectar thick liquids administered via straw provided slightly larger and heavier boluses which helped facilitate a more coordinated swallow, increased epiglottic deflection which led to reduced pharyngeal residue. One instance of premature spillage noted with straw administration, however, no instances of airway intrusion noted. Within the oral phase, mild amounts of lingual residue noted with liquids, which required a second swallow to clear residue on nectar thick liquid trials. However, mastication of regular consistency was efficient and adequate. Of note, pt presents with a prominent cricopharyngeal bar however material able to propel UES adequately. Suspect esophageal involvement as scan of the esophagus showed moderate amounts of bolus remaining in the distal esophagus. Despite recommendations for modified diet and environmental controls, he remains at a higher  aspiration risk. Recommend nectar thick liquids via straw and continue regular consistency diet. Full supervision to cue pt to clear throat intermittently throughout meals, medications crushed.   Swallow Evaluation Recommendations       SLP Diet Recommendations: Nectar thick liquid;Regular solids   Liquid Administration via: Straw   Medication Administration: Crushed with puree   Supervision: Staff to assist with self feeding   Compensations: Slow rate;Small sips/bites;Clear throat intermittently   Postural Changes: Seated upright at 90 degrees   Oral Care Recommendations: Oral care BID        Houston Siren 05/07/2022,3:15 PM

## 2022-05-07 NOTE — Care Management Important Message (Signed)
Important Message  Patient Details  Name: Jerry Wood MRN: [id number] Date of Birth: April 28, 1922   Medicare Important Message Given:  Yes     Daanya Lanphier Montine Circle 05/07/2022, 4:01 PM

## 2022-05-07 NOTE — Progress Notes (Signed)
  Brief PMT progress note:  Medical records reviewed including progress notes, labs, imaging.  Patient is medically stable for discharge and goals of care are clear for SNF placement with outpatient palliative care to follow.  PMT will continue to follow peripherally.  Family has PMT contact information should further needs arise.  Thank you for your referral and allowing PMT to assist in Jerry Wood's care.   Dorthy Cooler, [facility] Palliative Medicine Team  Team Phone # [phone]   NO CHARGE

## 2022-05-08 DIAGNOSIS — I639 Cerebral infarction, unspecified: Secondary | ICD-10-CM

## 2022-05-08 DIAGNOSIS — R531 Weakness: Secondary | ICD-10-CM | POA: Diagnosis not present

## 2022-05-08 DIAGNOSIS — R29810 Facial weakness: Secondary | ICD-10-CM | POA: Diagnosis not present

## 2022-05-08 MED ORDER — MIDODRINE HCL 5 MG PO TABS: 5.0000 mg | ORAL_TABLET | Freq: Three times a day (TID) | ORAL | 0 refills | Status: AC

## 2022-05-08 MED ORDER — ACETAMINOPHEN 500 MG PO TABS: 500.0000 mg | ORAL_TABLET | Freq: Four times a day (QID) | ORAL | 0 refills | Status: AC

## 2022-05-08 MED ORDER — PRAVASTATIN SODIUM 40 MG PO TABS: 40.0000 mg | ORAL_TABLET | Freq: Every evening | ORAL | Status: AC

## 2022-05-08 NOTE — Progress Notes (Signed)
Occupational Therapy Treatment Patient Details Name: Jerry Wood MRN: [id number] DOB: 1922/01/02 Today's Date: 05/08/2022   History of present illness Pt is a 88 y/o male who presented with L facial droop and slurred speech. CT head negative, MRI brain showing small R MCA infarcts. PMH: Parkinson's, a fib, HTN, PPM, CAD, CKD3   OT comments  Pt requiring increased assist from initial OT eval in ED. Focus on transfer training options to simulate at-home wheelchair transfers. Trialed without AD to stand at Max A and with RW, Mod A needed. Pt able to sustain standing briefly though diffiuclty taking steps, requiring Max A x 2 for stand pivot to recliner. Noted with prior PT sessions, daughter unable to provide assist currently needed and noted slower progress towards established OT goals. Updated DC recs to SNF to reflect pt/family goals.    Recommendations for follow up therapy are one component of a multi-disciplinary discharge planning process, led by the attending physician.  Recommendations may be updated based on patient status, additional functional criteria and insurance authorization.    Follow Up Recommendations  Skilled nursing-short term rehab (<3 hours/day) (family reports unable to provide the assist pt still requires)     Assistance Recommended at Discharge Frequent or constant Supervision/Assistance  Patient can return home with the following  A lot of help with bathing/dressing/bathroom;Two people to help with walking and/or transfers   Equipment Recommendations  None recommended by OT    Recommendations for Other Services      Precautions / Restrictions Precautions Precautions: Fall;Other (comment) Precaution Comments: impaired L shoulder ROM Restrictions Weight Bearing Restrictions: No       Mobility Bed Mobility Overal bed mobility: Needs Assistance Bed Mobility: Supine to Sit     Supine to sit: Mod assist, HOB elevated     General bed mobility comments:  assist to get R UE on bedrail, able to pull and bring LEs partially to EOB. handheld assist with LUE to lift trunk. increased time/effort to scoot    Transfers Overall transfer level: Needs assistance Equipment used: Rolling walker (2 wheels), None Transfers: Sit to/from Stand, Bed to chair/wheelchair/BSC Sit to Stand: Mod assist, Max assist Stand pivot transfers: Max assist, +2 physical assistance, +2 safety/equipment         General transfer comment: Stood from EOB without AD first and Max A needed. Trialed RW with improved ability to lift bottom though Mod A still required. difficulty lifting L foot to march in place. opted for Max A x 2 stand pivot with handheld assist     Balance Overall balance assessment: Needs assistance Sitting-balance support: No upper extremity supported, Feet supported Sitting balance-Leahy Scale: Fair     Standing balance support: Bilateral upper extremity supported, During functional activity Standing balance-Leahy Scale: Poor                             ADL either performed or assessed with clinical judgement   ADL Overall ADL's : Needs assistance/impaired                                       General ADL Comments: Focus on transfer training options to maximize ADL safety    Extremity/Trunk Assessment Upper Extremity Assessment Upper Extremity Assessment: LUE deficits/detail LUE Deficits / Details: grasp fair, biceps/triceps 4-/5, L shoulder ROM impaired to apporx 20* actively, reports hx  of frozen shoulder   Lower Extremity Assessment Lower Extremity Assessment: Defer to PT evaluation        Vision   Vision Assessment?: No apparent visual deficits   Perception     Praxis      Cognition Arousal/Alertness: Awake/alert Behavior During Therapy: WFL for tasks assessed/performed Overall Cognitive Status: Within Functional Limits for tasks assessed                                 General  Comments: pleasant, follows commands, limited due to [facility] at times but overall [facility]        Exercises      Shoulder Instructions       General Comments      Pertinent Vitals/ Pain       Pain Assessment Pain Assessment: No/denies pain  Home Living                                          Prior Functioning/Environment              Frequency  Min 2X/week        Progress Toward Goals  OT Goals(current goals can now be found in the care plan section)  Progress towards OT goals: Progressing toward goals  Acute Rehab OT Goals Patient Stated Goal: talk to daughter OT Goal Formulation: With patient Time For Goal Achievement: 05/18/22 Potential to Achieve Goals: Good ADL Goals Pt Will Perform Lower Body Bathing: with min guard assist;sit to/from stand;sitting/lateral leans Pt Will Perform Lower Body Dressing: with min guard assist;sitting/lateral leans;sit to/from stand Pt Will Transfer to Toilet: with min guard assist;stand pivot transfer;bedside commode  Plan Discharge plan needs to be updated    Co-evaluation                 AM-PAC OT "6 Clicks" Daily Activity     Outcome Measure   Help from another person eating meals?: A Little Help from another person taking care of personal grooming?: A Little Help from another person toileting, which includes using toliet, bedpan, or urinal?: A Little Help from another person bathing (including washing, rinsing, drying)?: A Lot Help from another person to put on and taking off regular upper body clothing?: A Little Help from another person to put on and taking off regular lower body clothing?: A Lot 6 Click Score: 16    End of Session Equipment Utilized During Treatment: Rolling walker (2 wheels);Gait belt  OT Visit Diagnosis: Unsteadiness on feet (R26.81);Other abnormalities of gait and mobility (R26.89);Muscle weakness (generalized) (M62.81)   Activity Tolerance Patient tolerated treatment  well   Patient Left in chair;with call bell/phone within reach;with chair alarm set   Nurse Communication Mobility status        Time: 2993-7169 OT Time Calculation (min): 22 min  Charges: OT General Charges $OT Visit: 1 Visit OT Treatments $Therapeutic Activity: 8-22 mins  Malachy Chamber, OTR/L Acute Rehab Services Office: [phone]   Layla Maw 05/08/2022, 11:56 AM

## 2022-05-08 NOTE — Progress Notes (Signed)
Brief Palliative Medicine Progress Note:  PMT following peripherally for needs/decline.  Medical records reviewed including progress notes, labs, imaging. Patient remains medically stable for discharge. Family has chosen [facility] and should have outpatient Palliative Care to follow - [facility] consult previously placed. Plan is for discharge tomorrow with current pending authorization approval.   PMT will continue to follow peripherally. If there are any imminent needs please call the service directly. Family also has PMT contact information should further needs arise.  Thank you for allowing PMT to assist in the care of this patient.  Mare Ludtke M[facility] Tam[facility] Palliative Medicine Team Team Phone: [phone] NO CHARGE

## 2022-05-08 NOTE — Progress Notes (Signed)
PROGRESS NOTE    Jerry Wood  CZY:[id number] DOB: [birth year] DOA: 05/03/2022 PCP: Pcp, No   Brief Narrative:  88 y.o. male with medical history significant for Parkinson's disease, permanent atrial fibrillation on Eliquis, hypertension, history of tachybradycardia syndrome status post pacemaker placement, coronary artery disease status post PCI to RCA, CKD 3B, nonambulatory, wheelchair dependent presented as a code stroke after he was found to have left facial droop and slurred speech along with dysphagia.  On presentation, CT head was nonacute.  Neurology recommended to continue Eliquis without significant outpatient follow-up with neurology.  Family pursuing SNF placement.  Assessment & Plan:   Acute possibly embolic stroke presenting with left facial droop/left-sided weakness: Small scattered right MCA infarcts -Most likely embolic pattern likely due to A-fib despite being on Eliquis as per neurology -CT head was nonacute.  MRI brain showed CVA as above.  MRI negative.  Carotid Doppler unremarkable.  Echo showed EF of 55 to 60%.  A1c 5.1 LDL 52 -Neurology evaluation and follow-up appreciated.  Neurology signed off on 05/04/2022 and recommended outpatient follow-up with neurology and to continue Eliquis along with pravastatin and Zetia  Permanent A-fib -currently rate controlled -Continue Eliquis  History of tachybradycardia syndrome: Status post pacemaker placement -Outpatient follow-up with cardiology/EP  Hypotension -Possibly related to Parkinson disease.  Started on midodrine by neurology on 05/04/2022.  This will be continued on discharge as well  Hyperlipidemia -Plan as above  Hypothyroidism -Continue levothyroxine  Parkinson's disease -Continue Sinemet.  Outpatient follow-up with neurology  Anemia of chronic disease -Possibly from chronic illnesses.  Hemoglobin stable.  Monitor intermittently.  Physical deconditioning Goals of care -Palliative care following.   CODE STATUS has been changed to DNR.   - Apparently, family might not be able to provide assistance at home and patient will need SNF.  TOC following  DVT prophylaxis: Eliquis Code Status: DNR Family Communication: None at bedside Disposition Plan: Status is: Inpatient Remains inpatient appropriate because: Of severity of illness.  Might need SNF placement.  Currently stable for discharge to SNF.  Consultants: Neurology/palliative care  Procedures: Echo  Antimicrobials: None   Subjective: Patient seen and examined at bedside.  Poor historian.  Extremely hard of hearing.  No fever, agitation or seizures reported  Objective: Vitals:   05/07/22 2019 05/07/22 2338 05/08/22 0312 05/08/22 0839  BP: 103/65 109/67 118/68 114/70  Pulse: 89 96 92 83  Resp: '16 18 16 20  '$ Temp: 98.1 F (36.7 C) 98.9 F (37.2 C) 97.6 F (36.4 C) 98 F (36.7 C)  TempSrc: Oral Oral Oral Oral  SpO2: 97% 97% 97% 98%  Weight:        Intake/Output Summary (Last 24 hours) at 05/08/2022 1020 Last data filed at 05/08/2022 0500 Gross per 24 hour  Intake 240 ml  Output 1100 ml  Net -860 ml    Filed Weights   05/03/22 1725 05/05/22 0450 05/06/22 0500  Weight: 71.7 kg 69.7 kg 69.7 kg    Examination:  General: On room air.  No distress.  Elderly male lying in bed.  Extremely hard of hearing.  Still slow to respond.  Poor historian. respiratory: Decreased breath sounds at bases bilaterally with some crackles CVS: Mostly rate controlled; S1-S2 heard  abdominal: Soft, nontender, distended slightly; no organomegaly; bowel sounds heard normally  extremities: Trace lower extremity edema present; no clubbing  Data Reviewed: I have personally reviewed following labs and imaging studies  CBC: Recent Labs  Lab 05/03/22 1721 05/03/22 1725 05/04/22 0410  WBC 10.2  --  9.5  NEUTROABS 6.9  --   --   HGB 12.3* 12.6* 12.2*  HCT 37.9* 37.0* 36.9*  MCV 101.1*  --  98.9  PLT 248  --  563    Basic Metabolic  Panel: Recent Labs  Lab 05/03/22 1721 05/03/22 1725 05/04/22 0410 05/05/22 0413 05/05/22 0414  NA 141 141  --  143  --   K 3.8 3.8  --  3.6  --   CL 103 102  --  109  --   CO2 26  --   --  22  --   GLUCOSE 109* 105*  --  107*  --   BUN 24* 25*  --  17  --   CREATININE 1.36* 1.40*  --  1.16  --   CALCIUM 9.1  --   --  8.8*  --   MG  --   --  2.2  --  2.0  PHOS  --   --   --  2.3*  --     GFR: Estimated Creatinine Clearance: 29.8 mL/min (by C-G formula based on SCr of 1.16 mg/dL). Liver Function Tests: Recent Labs  Lab 05/03/22 1721 05/05/22 0413  AST 22  --   ALT 6  --   ALKPHOS 108  --   BILITOT 0.7  --   PROT 6.5  --   ALBUMIN 3.6 3.0*    No results for input(s): "LIPASE", "AMYLASE" in the last 168 hours. No results for input(s): "AMMONIA" in the last 168 hours. Coagulation Profile: Recent Labs  Lab 05/03/22 1721  INR 1.2    Cardiac Enzymes: No results for input(s): "CKTOTAL", "CKMB", "CKMBINDEX", "TROPONINI" in the last 168 hours. BNP (last 3 results) No results for input(s): "PROBNP" in the last 8760 hours. HbA1C: No results for input(s): "HGBA1C" in the last 72 hours.  CBG: Recent Labs  Lab 05/03/22 1721  GLUCAP 108*    Lipid Profile: No results for input(s): "CHOL", "HDL", "LDLCALC", "TRIG", "CHOLHDL", "LDLDIRECT" in the last 72 hours.  Thyroid Function Tests: No results for input(s): "TSH", "T4TOTAL", "FREET4", "T3FREE", "THYROIDAB" in the last 72 hours. Anemia Panel: No results for input(s): "VITAMINB12", "FOLATE", "FERRITIN", "TIBC", "IRON", "RETICCTPCT" in the last 72 hours. Sepsis Labs: No results for input(s): "PROCALCITON", "LATICACIDVEN" in the last 168 hours.  No results found for this or any previous visit (from the past 240 hour(s)).       Radiology Studies: DG Swallowing Func-Speech Pathology  Result Date: 05/07/2022 Table formatting from the original result was not included. Objective Swallowing Evaluation: Type of Study:  MBS-Modified Barium Swallow Study  Patient Details Name: Jerry Wood MRN: [id number] Date of Birth: April 12, 1922 Today's Date: 05/07/2022 Time: SLP Start Time (ACUTE ONLY): 22 -SLP Stop Time (ACUTE ONLY): 1319 SLP Time Calculation (min) (ACUTE ONLY): 13 min Past Medical History: Past Medical History: Diagnosis Date  Allergic rhinitis   Cancer ([place]) 09/2015  skin ,basal cell  Chronic knee pain   Constipation   COPD, mild ([place])   Coronary artery disease   s/p PCI of the RCA  Diverticulosis   Dyslipidemia   Gout   Hypertension   Long term (current) use of anticoagulants   Onychomycosis   Osteoarthrosis, unspecified whether generalized or localized, other specified sites   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 ECHO 06/04/11 LVEF estimated by 2D at 52.1%  Pure hypercholesterolemia   Seborrheic keratosis, inflamed   Skin lesion   Plan to remove left posterior ear lesion with electrocautery  Tachy-brady syndrome [facility])   s/p permanent pacemaker placement  Unspecified hypothyroidism   Vertigo   Vitamin D deficiency  Past Surgical History: Past Surgical History: Procedure Laterality Date  BIV PACEMAKER GENERATOR CHANGEOUT N/A 03/13/2022  Procedure: BIV PACEMAKER GENERATOR CHANGEOUT;  Surgeon: Vickie Epley, MD;  Location: [facility];  Service: Cardiovascular;  Laterality: N/A;  FRACTURE SURGERY Right   clavicle  INGUINAL HERNIA REPAIR Bilateral   LAPAROSCOPIC PARTIAL COLECTOMY    Partial resection of sigmoid colon  PACEMAKER PLACEMENT  2003  Tachybradycardia syndrome. Gen change 07/29/08, Dr. Leonia Reeves  TYMPANOPLASTY Bilateral  HPI: Pt is a 88 y/o male who presented with L facial droop and slurred speech. CT head negative, MRI brain pending pacemaker compatibility. PMH: Parkinson's, COPD, a fib, HTN, PPM, CAD, CKD3  No data recorded  Recommendations for follow up therapy are one component of a  multi-disciplinary discharge planning process, led by the attending physician.  Recommendations may be updated based on patient status, additional functional criteria and insurance authorization. Assessment / Plan / Recommendation   05/07/2022   1:24 PM Clinical Impressions Clinical Impression Pt presents with moderate dysphagia with deficits predominately in the pharyngeal phase. Reduced epiglottic deflection noted across all consistencies, which led to penetration of puree consistency and nectar thick liquids from cup (PAS 3). With thin liquids, the reduced epiglottic deflection led to penetration and silent aspiration (PAS 3, 8) during the swallow. Mild pharyngeal residue in the valleculae and pyriform sinuses intermittently noted with both liquid consistencies via cup sip, however, with nectar thick liquids, the residue spilled into the laryngeal vestibule post swallow (PAS 3). Pt did not sense any penetrated or aspirated material, however, when cued to clear throat/cough the remaining material was cleared from airway. Nectar thick liquids administered via straw provided slightly larger and heavier boluses which helped facilitate a more coordinated swallow, increased epiglottic deflection which led to reduced pharyngeal residue. One instance of premature spillage noted with straw administration, however, no instances of airway intrusion noted. Within the oral phase, mild amounts of lingual residue noted with liquids, which required a second swallow to clear residue on nectar thick liquid trials. However, mastication of regular consistency was efficient and adequate. Of note, pt presents with a prominent cricopharyngeal bar however material able to propel UES adequately. Suspect esophageal involvement as scan of the esophagus showed moderate amounts of bolus remaining in the distal esophagus. Despite recommendations for modified diet and environmental controls, he remains at a higher aspiration risk. Recommend  nectar thick liquids via straw and continue regular consistency diet. Full supervision to cue pt to clear throat intermittently throughout meals, medications crushed. SLP Visit Diagnosis Dysphagia, oropharyngeal phase (R13.12)     05/07/2022   1:24 PM Treatment Recommendations Treatment Recommendations Therapy as outlined in treatment plan below     05/07/2022   1:24 PM Prognosis Prognosis for Safe Diet Advancement Good Barriers to Reach Goals Severity of deficits   05/07/2022   1:24 PM Diet Recommendations SLP Diet Recommendations Nectar thick liquid;Regular solids Liquid Administration via Straw Medication Administration Crushed with puree Compensations Slow rate;Small sips/bites;Clear throat intermittently Postural Changes Seated upright at 90 degrees     05/07/2022   1:24 PM Other Recommendations Oral Care Recommendations Oral care BID Follow Up Recommendations Skilled nursing-short term rehab (<3 hours/day) Functional Status Assessment Patient has had a recent decline in  their functional status and demonstrates the ability to make significant improvements in function in a reasonable and predictable amount of time.   05/07/2022   1:24 PM Frequency and Duration  Speech Therapy Frequency (ACUTE ONLY) min 2x/week Treatment Duration 2 weeks     05/07/2022   1:24 PM Oral Phase Oral Phase Impaired Oral - Nectar Teaspoon NT Oral - Nectar Cup Lingual/palatal residue Oral - Nectar Straw Lingual/palatal residue;Premature spillage;Decreased bolus cohesion Oral - Thin Teaspoon NT Oral - Thin Cup Lingual/palatal residue Oral - Thin Straw NT Oral - Puree Lingual/palatal residue Oral - Regular [facility]    05/07/2022   1:24 PM Pharyngeal Phase Pharyngeal Phase Impaired Pharyngeal- Nectar Teaspoon NT Pharyngeal- Nectar Cup Delayed swallow initiation-vallecula;Pharyngeal residue - valleculae;Pharyngeal residue - pyriform;Penetration/Apiration after swallow;Penetration/Aspiration during swallow;Reduced airway/laryngeal closure;Reduced  epiglottic inversion Pharyngeal Material enters airway, remains ABOVE vocal cords and not ejected out Pharyngeal- Nectar Straw Reduced epiglottic inversion;Pharyngeal residue - valleculae Pharyngeal Material does not enter airway Pharyngeal- Thin Teaspoon NT Pharyngeal- Thin Cup Pharyngeal residue - valleculae;Pharyngeal residue - pyriform;Penetration/Aspiration during swallow;Reduced airway/laryngeal closure;Reduced epiglottic inversion Pharyngeal Material enters airway, passes BELOW cords without attempt by patient to eject out (silent aspiration);Material enters airway, remains ABOVE vocal cords and not ejected out Pharyngeal- Thin Straw NT Pharyngeal- Puree Delayed swallow initiation-vallecula;Reduced epiglottic inversion;Penetration/Aspiration during swallow;Pharyngeal residue - valleculae Pharyngeal Material enters airway, remains ABOVE vocal cords and not ejected out Pharyngeal- Regular [facility]    05/07/2022   1:24 PM Cervical Esophageal Phase  Cervical Esophageal Phase -- [place] Siren 05/07/2022, 3:14 PM                          Scheduled Meds:   stroke: early stages of recovery book   Does not apply Once   acetaminophen  500 mg Oral Q6H   apixaban  5 mg Oral BID   carbidopa-levodopa  2 tablet Oral TID WC   cyanocobalamin  1,000 mcg Oral Daily   ezetimibe  10 mg Oral Daily   levothyroxine  75 mcg Oral QAC breakfast   midodrine  5 mg Oral TID WC   multivitamin with minerals  1 tablet Oral Daily   mouth rinse  15 mL Mouth Rinse 4 times per day   pravastatin  40 mg Oral Daily   Continuous Infusions:          Aline August, MD [facility] 05/08/2022, 10:20 AM

## 2022-05-08 NOTE — TOC Progression Note (Signed)
Transition of Care [facility]) - Progression Note    Patient Details  Name: Jerry Wood MRN: [id number] Date of Birth: Oct 08, 1921  Transition of Care [facility]) CM/SW [place], [place] Phone Number: 05/08/2022, 4:12 PM  Clinical Narrative:    Pt's family has chosen [facility]. Authorization is pending, but updated with facility. Pt is medically stable, plan to DC tomorrow with Auth approval. TOC will continue to follow for DC needs.   Expected Discharge Plan: [place] Barriers to Discharge: Continued Medical Work up  Expected Discharge Plan and Services Expected Discharge Plan: [place] In-house Referral: Clinical Social Work     Living arrangements for the past 2 months: Single Family Home                                       Social Determinants of Health (SDOH) Interventions    Readmission Risk Interventions     No data to display

## 2022-05-09 DIAGNOSIS — G459 Transient cerebral ischemic attack, unspecified: Secondary | ICD-10-CM | POA: Diagnosis not present

## 2022-05-09 DIAGNOSIS — I639 Cerebral infarction, unspecified: Secondary | ICD-10-CM | POA: Diagnosis not present

## 2022-05-09 DIAGNOSIS — M6281 Muscle weakness (generalized): Secondary | ICD-10-CM | POA: Diagnosis not present

## 2022-05-09 DIAGNOSIS — R41841 Cognitive communication deficit: Secondary | ICD-10-CM | POA: Diagnosis not present

## 2022-05-09 DIAGNOSIS — R1312 Dysphagia, oropharyngeal phase: Secondary | ICD-10-CM | POA: Diagnosis not present

## 2022-05-09 DIAGNOSIS — R531 Weakness: Secondary | ICD-10-CM | POA: Diagnosis not present

## 2022-05-09 DIAGNOSIS — R293 Abnormal posture: Secondary | ICD-10-CM | POA: Diagnosis not present

## 2022-05-09 DIAGNOSIS — Z23 Encounter for immunization: Secondary | ICD-10-CM | POA: Diagnosis not present

## 2022-05-09 DIAGNOSIS — I63411 Cerebral infarction due to embolism of right middle cerebral artery: Secondary | ICD-10-CM | POA: Diagnosis not present

## 2022-05-09 MED ORDER — INFLUENZA VAC A&B SA ADJ QUAD 0.5 ML IM PRSY
0.5000 mL | PREFILLED_SYRINGE | Freq: Once | INTRAMUSCULAR | Status: AC
  Administered 2022-05-09: 0.5 mL via INTRAMUSCULAR
  Filled 2022-05-09: qty 0.5

## 2022-05-09 MED ORDER — INFLUENZA VAC A&B SA ADJ QUAD 0.5 ML IM PRSY: 0.5000 mL | PREFILLED_SYRINGE | INTRAMUSCULAR | Status: DC

## 2022-05-09 NOTE — Progress Notes (Signed)
Mobility Specialist: Progress Note   05/09/22 1208  Mobility  Activity Transferred from bed to chair  Level of Assistance +2 (takes two people)  Assistive Device Stedy  Activity Response Tolerated well  Mobility Referral Yes  $Mobility charge 1 Mobility   Pt received in the bed and agreeable to transfer. No c/o throughout. MaxA with bed mobility and minA to stand. Pt could probably be +1 assist with stedy. Chair alarm is on and call bell in reach.   Bearden Shahil Speegle Mobility Specialist Please contact via SecureChat or Rehab office at [phone]

## 2022-05-09 NOTE — TOC Transition Note (Signed)
Transition of Care [facility]) - CM/SW Discharge Note   Patient Details  Name: Jerry Wood MRN: [id number] Date of Birth: Oct 26, 1921  [facility]) CM/SW Contact:  Coralee Pesa, [place] Phone Number: 05/09/2022, 11:42 AM   Clinical Narrative:     Pt to be transported to [facility] via [place]. Nurse to call report to [phone] Rm# [phone]  Final next level of care: Skilled Nursing Facility Barriers to Discharge: Barriers Resolved   Patient Goals and CMS Choice Patient states their goals for this hospitalization and ongoing recovery are:: Get stronger      Discharge Placement              Patient chooses bed at: [facility] Patient to be transferred to facility by: [place] Name of family member notified: Butch Penny Patient and family notified of of transfer: 05/09/22  Discharge Plan and Services In-house Referral: Clinical Social Work                                   Social Determinants of Health (SDOH) Interventions     Readmission Risk Interventions     No data to display

## 2022-05-09 NOTE — Discharge Summary (Signed)
Physician Discharge Summary  Jerry Wood UYQ:[id number] DOB: 11/29/1921 DOA: 05/03/2022  PCP: Jerry Wood, No  Admit date: 05/03/2022 Discharge date: 05/09/2022  Admitted From: Home Disposition: Skilled nursing facility  Recommendations for Outpatient Follow-up:  Follow up with PCP in 1-2 weeks after discharge We will send referral to neurology for follow-up Consult palliative care for follow-up at nursing home.  Home Health: N/A Equipment/Devices: N/A  Discharge Condition: Fair CODE STATUS: DNR Diet recommendation: Regular diet, nutritional supplements, nectar thick liquid.  All-time aspiration precautions.  Discharge summary: 88 y.o. male with medical history significant for Parkinson's disease, permanent atrial fibrillation on Eliquis, hypertension, history of tachybradycardia syndrome status post pacemaker placement, coronary artery disease status post PCI to RCA, CKD 3B, nonambulatory, wheelchair dependent presented as a code stroke after he was found to have left facial droop and slurred speech along with dysphagia.  On presentation, CT head was nonacute.  Neurology recommended to continue Eliquis.  Family pursuing SNF placement with palliative care follow-up.   Assessment & Plan of care:   Acute possibly embolic stroke presenting with left facial droop/left-sided weakness: Small scattered right MCA infarcts -Most likely embolic pattern likely due to A-fib despite being on Eliquis as per neurology -CT head was nonacute.  MRI brain showed CVA as above.  MRA negative.  Carotid Doppler unremarkable.  Echo showed EF of 55 to 60%.  A1c 5.1 LDL 52 -Neurology evaluation and follow-up appreciated.  Neurology signed off on 05/04/2022 and recommended outpatient follow-up with neurology and to continue Eliquis along with pravastatin and Zetia.   Permanent A-fib -currently rate controlled -Continue Eliquis   History of tachybradycardia syndrome: Status post pacemaker placement -Outpatient  follow-up with cardiology/EP   Hypotension -Possibly related to Parkinson disease.  Started on midodrine by neurology on 05/04/2022.  This will be continued on discharge as well   Hyperlipidemia -Plan as above   Hypothyroidism -Continue levothyroxine   Parkinson's disease -Continue Sinemet.  Outpatient follow-up with neurology   Anemia of chronic disease -Possibly from chronic illnesses.  Hemoglobin stable.  Monitor intermittently.   Physical deconditioning Goals of care -Palliative care following.  CODE STATUS has been changed to DNR.    Remains high risk of decompensation, stable to discharge to skilled level of care today.   Discharge Diagnoses:  Principal Problem:   Left-sided weakness Active Problems:   TIA (transient ischemic attack)   Acute CVA (cerebrovascular accident) [facility])   Facial droop    Discharge Instructions  Discharge Instructions     Ambulatory referral to Neurology   Complete by: As directed    Follow up with Dr. Leta Wood at [facility] in 4-6 weeks.  Patient is Dr. Gladstone Wood patient.  Thanks.   Diet general   Complete by: As directed    Nectar thick fluid, all time aspiration precautions   Increase activity slowly   Complete by: As directed       Allergies as of 05/09/2022   No Known Allergies      Medication List     STOP taking these medications    diltiazem 120 MG 24 hr capsule Commonly known as: CARDIZEM CD   furosemide 80 MG tablet Commonly known as: LASIX   imiquimod 5 % cream Commonly known as: ALDARA   losartan 50 MG tablet Commonly known as: COZAAR   potassium chloride SA 20 MEQ tablet Commonly known as: KLOR-CON M   PreserVision AREDS Caps       TAKE these medications    acetaminophen 500 MG tablet  Commonly known as: TYLENOL Take 1,000 mg by mouth every 6 (six) hours as needed for moderate pain. What changed: Another medication with the same name was added. Make sure you understand how and when to take  each.   acetaminophen 500 MG tablet Commonly known as: TYLENOL Take 1 tablet (500 mg total) by mouth every 6 (six) hours. What changed: You were already taking a medication with the same name, and this prescription was added. Make sure you understand how and when to take each.   apixaban 5 MG Tabs tablet Commonly known as: Eliquis TAKE 1 TABLET(5 MG) BY MOUTH TWICE DAILY What changed:  how much to take how to take this when to take this additional instructions   calcium carbonate 500 MG chewable tablet Commonly known as: TUMS - dosed in mg elemental calcium Chew 1 tablet by mouth daily as needed for indigestion or heartburn.   carbidopa-levodopa 25-100 MG tablet Commonly known as: SINEMET IR TAKE 2 TABLETS BY MOUTH THREE TIMES DAILY   Colace 2-IN-1 8.6-50 MG tablet Generic drug: senna-docusate Take 1 tablet by mouth daily.   cyanocobalamin 1000 MCG tablet Commonly known as: VITAMIN B12 Take 1,000 mcg by mouth daily.   ezetimibe 10 MG tablet Commonly known as: ZETIA TAKE 1 TABLET(10 MG) BY MOUTH DAILY What changed: See the new instructions.   levothyroxine 75 MCG tablet Commonly known as: SYNTHROID Take 75 mcg by mouth daily before breakfast.   loratadine 10 MG tablet Commonly known as: CLARITIN Take 10 mg by mouth daily.   midodrine 5 MG tablet Commonly known as: PROAMATINE Take 1 tablet (5 mg total) by mouth 3 (three) times daily with meals.   multivitamin capsule Take 1 capsule by mouth daily.   pravastatin 40 MG tablet Commonly known as: PRAVACHOL Take 1 tablet (40 mg total) by mouth every evening.        Contact information for follow-up providers     Penumalli, [name], MD. Schedule an appointment as soon as possible for a visit in 1 month(s).   Specialties: Neurology, Radiology Contact information: [address] [place] [address] [phone]              Contact information for after-discharge care     Destination      [facility] .   Service: Skilled Nursing Contact information: [place] [phone]                    No Known Allergies  Consultations: Neurology Palliative   Procedures/Studies: DG Swallowing Func-Speech Pathology  Result Date: 05/07/2022 Table formatting from the original result was not included. Objective Swallowing Evaluation: Type of Study: MBS-Modified Barium Swallow Study  Patient Details Name: Jerry Wood MRN: [id number] Date of Birth: 01-05-1922 Today's Date: 05/07/2022 Time: SLP Start Time (ACUTE ONLY): 16 -SLP Stop Time (ACUTE ONLY): 7253 SLP Time Calculation (min) (ACUTE ONLY): 13 min Past Medical History: Past Medical History: Diagnosis Date  Allergic rhinitis   Cancer ([place]) 09/2015  skin ,basal cell  Chronic knee pain   Constipation   COPD, mild ([place])   Coronary artery disease   s/p PCI of the RCA  Diverticulosis   Dyslipidemia   Gout   Hypertension   Long term (current) use of anticoagulants   Onychomycosis   Osteoarthrosis, unspecified whether generalized or localized, other specified sites   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 ECHO 06/04/11 LVEF estimated by 2D at 52.1%  Pure hypercholesterolemia   Seborrheic keratosis, inflamed   Skin lesion   Plan to remove left posterior ear lesion with electrocautery  Tachy-brady syndrome [facility])   s/p permanent pacemaker placement  Unspecified hypothyroidism   Vertigo   Vitamin D deficiency  Past Surgical History: Past Surgical History: Procedure Laterality Date  BIV PACEMAKER GENERATOR CHANGEOUT N/A 03/13/2022  Procedure: BIV PACEMAKER GENERATOR CHANGEOUT;  Surgeon: Vickie Epley, MD;  Location: [place] [facility];  Service: Cardiovascular;  Laterality: N/A;  FRACTURE SURGERY Right   clavicle  INGUINAL HERNIA REPAIR Bilateral    LAPAROSCOPIC PARTIAL COLECTOMY    Partial resection of sigmoid colon  PACEMAKER PLACEMENT  2003  Tachybradycardia syndrome. Gen change 07/29/08, Dr. Leonia Reeves  TYMPANOPLASTY Bilateral  HPI: Pt is a 88 y/o male who presented with L facial droop and slurred speech. CT head negative, MRI brain pending pacemaker compatibility. PMH: Parkinson's, COPD, a fib, HTN, PPM, CAD, CKD3  No data recorded  Recommendations for follow up therapy are one component of a multi-disciplinary discharge planning process, led by the attending physician.  Recommendations may be updated based on patient status, additional functional criteria and insurance authorization. Assessment / Plan / Recommendation   05/07/2022   1:24 PM Clinical Impressions Clinical Impression Pt presents with moderate dysphagia with deficits predominately in the pharyngeal phase. Reduced epiglottic deflection noted across all consistencies, which led to penetration of puree consistency and nectar thick liquids from cup (PAS 3). With thin liquids, the reduced epiglottic deflection led to penetration and silent aspiration (PAS 3, 8) during the swallow. Mild pharyngeal residue in the valleculae and pyriform sinuses intermittently noted with both liquid consistencies via cup sip, however, with nectar thick liquids, the residue spilled into the laryngeal vestibule post swallow (PAS 3). Pt did not sense any penetrated or aspirated material, however, when cued to clear throat/cough the remaining material was cleared from airway. Nectar thick liquids administered via straw provided slightly larger and heavier boluses which helped facilitate a more coordinated swallow, increased epiglottic deflection which led to reduced pharyngeal residue. One instance of premature spillage noted with straw administration, however, no instances of airway intrusion noted. Within the oral phase, mild amounts of lingual residue noted with liquids, which required a second swallow to clear residue on  nectar thick liquid trials. However, mastication of regular consistency was efficient and adequate. Of note, pt presents with a prominent cricopharyngeal bar however material able to propel UES adequately. Suspect esophageal involvement as scan of the esophagus showed moderate amounts of bolus remaining in the distal esophagus. Despite recommendations for modified diet and environmental controls, he remains at a higher aspiration risk. Recommend nectar thick liquids via straw and continue regular consistency diet. Full supervision to cue pt to clear throat intermittently throughout meals, medications crushed. SLP Visit Diagnosis Dysphagia, oropharyngeal phase (R13.12)     05/07/2022   1:24 PM Treatment Recommendations Treatment Recommendations Therapy as outlined in treatment plan below     05/07/2022   1:24 PM Prognosis Prognosis for Safe Diet Advancement Good Barriers to Reach Goals Severity of deficits   05/07/2022   1:24 PM Diet Recommendations SLP Diet Recommendations Nectar thick liquid;Regular solids Liquid Administration via Straw Medication Administration Crushed with puree Compensations Slow rate;Small sips/bites;Clear throat intermittently Postural Changes Seated upright at 90 degrees     05/07/2022   1:24 PM Other Recommendations Oral Care Recommendations Oral care  BID Follow Up Recommendations Skilled nursing-short term rehab (<3 hours/day) Functional Status Assessment Patient has had a recent decline in their functional status and demonstrates the ability to make significant improvements in function in a reasonable and predictable amount of time.   05/07/2022   1:24 PM Frequency and Duration  Speech Therapy Frequency (ACUTE ONLY) min 2x/week Treatment Duration 2 weeks     05/07/2022   1:24 PM Oral Phase Oral Phase Impaired Oral - Nectar Teaspoon NT Oral - Nectar Cup Lingual/palatal residue Oral - Nectar Straw Lingual/palatal residue;Premature spillage;Decreased bolus cohesion Oral - Thin Teaspoon NT  Oral - Thin Cup Lingual/palatal residue Oral - Thin Straw NT Oral - Puree Lingual/palatal residue Oral - Regular [facility]    05/07/2022   1:24 PM Pharyngeal Phase Pharyngeal Phase Impaired Pharyngeal- Nectar Teaspoon NT Pharyngeal- Nectar Cup Delayed swallow initiation-vallecula;Pharyngeal residue - valleculae;Pharyngeal residue - pyriform;Penetration/Apiration after swallow;Penetration/Aspiration during swallow;Reduced airway/laryngeal closure;Reduced epiglottic inversion Pharyngeal Material enters airway, remains ABOVE vocal cords and not ejected out Pharyngeal- Nectar Straw Reduced epiglottic inversion;Pharyngeal residue - valleculae Pharyngeal Material does not enter airway Pharyngeal- Thin Teaspoon NT Pharyngeal- Thin Cup Pharyngeal residue - valleculae;Pharyngeal residue - pyriform;Penetration/Aspiration during swallow;Reduced airway/laryngeal closure;Reduced epiglottic inversion Pharyngeal Material enters airway, passes BELOW cords without attempt by patient to eject out (silent aspiration);Material enters airway, remains ABOVE vocal cords and not ejected out Pharyngeal- Thin Straw NT Pharyngeal- Puree Delayed swallow initiation-vallecula;Reduced epiglottic inversion;Penetration/Aspiration during swallow;Pharyngeal residue - valleculae Pharyngeal Material enters airway, remains ABOVE vocal cords and not ejected out Pharyngeal- Regular [facility]    05/07/2022   1:24 PM Cervical Esophageal Phase  Cervical Esophageal Phase -- [place] Siren 05/07/2022, 3:14 PM                     MR BRAIN WO CONTRAST  Result Date: 05/04/2022 CLINICAL DATA:  Neuro deficit, acute, stroke suspected. Left facial droop and slurred speech. EXAM: MRI HEAD WITHOUT CONTRAST MRA HEAD WITHOUT CONTRAST TECHNIQUE: Multiplanar, multi-echo pulse sequences of the brain and surrounding structures were acquired without intravenous contrast. Angiographic images of the Circle of Willis were acquired using MRA technique without intravenous contrast.  COMPARISON:  Head CT 05/03/2022 FINDINGS: MRI HEAD FINDINGS Brain: Small acute right MCA territory cortical infarcts are present involving the insula and frontal lobe. Scattered small T2 hyperintensities in the cerebral white matter and pons are nonspecific but compatible with mild chronic small vessel ischemic disease. Chronic lacunar infarcts are noted in the caudate nuclei and cerebellar hemispheres bilaterally. Generalized cerebral atrophy is likely within normal limits for age. No intracranial hemorrhage, mass, midline shift, or extra-axial fluid collection is identified. Vascular: Major intracranial vascular flow voids are preserved. Skull and upper cervical spine: Unremarkable bone marrow signal. Sinuses/Orbits: Bilateral cataract extraction. Clear paranasal sinuses. Small mastoid effusions. Other: None. MRA HEAD FINDINGS Anterior circulation: The internal carotid arteries are widely patent from skull base to carotid termini. ACAs and MCAs are patent without evidence of a proximal branch occlusion or significant proximal stenosis. No aneurysm is identified. Posterior circulation: The intracranial vertebral arteries are patent to the basilar with the left being dominant. The right vertebral artery is hypoplastic distal to the PICA origin. Patent PICA and SCA origins are seen bilaterally. The basilar artery is widely patent. Posterior communicating arteries are diminutive or absent. Both PCAs are patent without evidence of significant proximal stenosis. No aneurysm is identified. Anatomic variants: None. IMPRESSION: 1. Small acute right MCA infarcts. 2. Mild chronic small vessel ischemic disease. 3. Negative head  MRA. Electronically Signed   By: Logan Bores M.D.   On: 05/04/2022 13:57   MR ANGIO HEAD WO CONTRAST  Result Date: 05/04/2022 CLINICAL DATA:  Neuro deficit, acute, stroke suspected. Left facial droop and slurred speech. EXAM: MRI HEAD WITHOUT CONTRAST MRA HEAD WITHOUT CONTRAST TECHNIQUE:  Multiplanar, multi-echo pulse sequences of the brain and surrounding structures were acquired without intravenous contrast. Angiographic images of the Circle of Willis were acquired using MRA technique without intravenous contrast. COMPARISON:  Head CT 05/03/2022 FINDINGS: MRI HEAD FINDINGS Brain: Small acute right MCA territory cortical infarcts are present involving the insula and frontal lobe. Scattered small T2 hyperintensities in the cerebral white matter and pons are nonspecific but compatible with mild chronic small vessel ischemic disease. Chronic lacunar infarcts are noted in the caudate nuclei and cerebellar hemispheres bilaterally. Generalized cerebral atrophy is likely within normal limits for age. No intracranial hemorrhage, mass, midline shift, or extra-axial fluid collection is identified. Vascular: Major intracranial vascular flow voids are preserved. Skull and upper cervical spine: Unremarkable bone marrow signal. Sinuses/Orbits: Bilateral cataract extraction. Clear paranasal sinuses. Small mastoid effusions. Other: None. MRA HEAD FINDINGS Anterior circulation: The internal carotid arteries are widely patent from skull base to carotid termini. ACAs and MCAs are patent without evidence of a proximal branch occlusion or significant proximal stenosis. No aneurysm is identified. Posterior circulation: The intracranial vertebral arteries are patent to the basilar with the left being dominant. The right vertebral artery is hypoplastic distal to the PICA origin. Patent PICA and SCA origins are seen bilaterally. The basilar artery is widely patent. Posterior communicating arteries are diminutive or absent. Both PCAs are patent without evidence of significant proximal stenosis. No aneurysm is identified. Anatomic variants: None. IMPRESSION: 1. Small acute right MCA infarcts. 2. Mild chronic small vessel ischemic disease. 3. Negative head MRA. Electronically Signed   By: Logan Bores M.D.   On: 05/04/2022  13:57   ECHOCARDIOGRAM COMPLETE BUBBLE STUDY  Result Date: 05/04/2022    ECHOCARDIOGRAM REPORT   Patient Name:   Jerry Wood Date of Exam: 05/04/2022 Medical Rec #:  [id number]      Height:       61.0 in Accession #:    [id number]     Weight:       158.1 lb Date of Birth:  Oct 20, 1921      BSA:          1.709 m Patient Age:    88 years       BP:           80/53 mmHg Patient Gender: M              HR:           57 bpm. Exam Location:  Inpatient Procedure: 2D Echo and Saline Contrast Bubble Study Indications:    srtoke  History:        Patient has prior history of Echocardiogram examinations, most                 recent 02/01/2015. Pacemaker, Signs/Symptoms:Edema; Risk                 Factors:Hypertension.  Sonographer:    Harvie Junior Referring Phys: [id number] [place]  Sonographer Comments: Technically difficult study due to poor echo windows and no subcostal window. IMPRESSIONS  1. Left ventricular ejection fraction, by estimation, is 55 to 60%. The left ventricle has normal function. The left ventricle has no regional wall motion abnormalities. Left  ventricular diastolic function could not be evaluated.  2. Right ventricular systolic function is normal. The right ventricular size is normal.  3. Left atrial size was severely dilated.  4. The mitral valve is grossly normal. Mild mitral valve regurgitation. No evidence of mitral stenosis.  5. The aortic valve is tricuspid. There is moderate calcification of the aortic valve. There is moderate thickening of the aortic valve. Aortic valve regurgitation is not visualized. Mild aortic valve stenosis.  6. Agitated saline contrast bubble study was negative, with no evidence of any interatrial shunt. Conclusion(s)/Recommendation(s): No intracardiac source of embolism detected on this transthoracic study. Consider a transesophageal echocardiogram to exclude cardiac source of embolism if clinically indicated. FINDINGS  Left Ventricle: Left ventricular ejection  fraction, by estimation, is 55 to 60%. The left ventricle has normal function. The left ventricle has no regional wall motion abnormalities. The left ventricular internal cavity size was normal in size. Suboptimal image quality limits for assessment of left ventricular hypertrophy. Left ventricular diastolic function could not be evaluated due to atrial fibrillation. Left ventricular diastolic function could not be evaluated. Right Ventricle: The right ventricular size is normal. No increase in right ventricular wall thickness. Right ventricular systolic function is normal. Left Atrium: Left atrial size was severely dilated. Right Atrium: Right atrial size was normal in size. Pericardium: Trivial pericardial effusion is present. Mitral Valve: The mitral valve is grossly normal. There is mild calcification of the anterior mitral valve leaflet(s). Mild mitral valve regurgitation. No evidence of mitral valve stenosis. Tricuspid Valve: The tricuspid valve is grossly normal. Tricuspid valve regurgitation is mild . No evidence of tricuspid stenosis. Aortic Valve: The aortic valve is tricuspid. There is moderate calcification of the aortic valve. There is moderate thickening of the aortic valve. Aortic valve regurgitation is not visualized. Mild aortic stenosis is present. Aortic valve mean gradient measures 8.0 mmHg. Aortic valve peak gradient measures 13.1 mmHg. Aortic valve area, by VTI measures 1.37 cm. Pulmonic Valve: The pulmonic valve was grossly normal. Pulmonic valve regurgitation is trivial. No evidence of pulmonic stenosis. Aorta: The aortic root and ascending aorta are structurally normal, with no evidence of dilitation. Venous: The inferior vena cava was not well visualized. IAS/Shunts: No atrial level shunt detected by color flow Doppler. Agitated saline contrast was given intravenously to evaluate for intracardiac shunting. Agitated saline contrast bubble study was negative, with no evidence of any  interatrial shunt. Additional Comments: A device lead is visualized in the right atrium and right ventricle.  LEFT VENTRICLE PLAX 2D LVIDd:         3.90 cm     Diastology LVIDs:         3.30 cm     LV e' medial:    6.20 cm/s LV PW:         1.00 cm     LV E/e' medial:  10.4 LV IVS:        1.00 cm     LV e' lateral:   9.68 cm/s LVOT diam:     2.00 cm     LV E/e' lateral: 6.7 LV SV:         50 LV SV Index:   29 LVOT Area:     3.14 cm  LV Volumes (MOD) LV vol d, MOD A2C: 35.1 ml LV vol d, MOD A4C: 57.9 ml LV vol s, MOD A2C: 24.3 ml LV vol s, MOD A4C: 23.0 ml LV SV MOD A2C:     10.8 ml LV SV  MOD A4C:     57.9 ml LV SV MOD BP:      21.8 ml RIGHT VENTRICLE RV Basal diam:  3.40 cm RV Mid diam:    1.90 cm RV S prime:     17.10 cm/s TAPSE (M-mode): 1.3 cm LEFT ATRIUM              Index        RIGHT ATRIUM           Index LA Vol (A2C):   77.0 ml  45.05 ml/m  RA Area:     13.10 cm LA Vol (A4C):   128.0 ml 74.90 ml/m  RA Volume:   30.50 ml  17.85 ml/m LA Biplane Vol: 104.0 ml 60.85 ml/m  AORTIC VALVE                     PULMONIC VALVE AV Area (Vmax):    1.36 cm      PV Vmax:       0.81 m/s AV Area (Vmean):   1.37 cm      PV Peak grad:  2.7 mmHg AV Area (VTI):     1.37 cm AV Vmax:           181.00 cm/s AV Vmean:          133.000 cm/s AV VTI:            0.362 m AV Peak Grad:      13.1 mmHg AV Mean Grad:      8.0 mmHg LVOT Vmax:         78.30 cm/s LVOT Vmean:        57.800 cm/s LVOT VTI:          0.158 m LVOT/AV VTI ratio: 0.44  AORTA Ao Root diam: 3.30 cm MITRAL VALVE               TRICUSPID VALVE MV Area (PHT): 4.49 cm    TR Peak grad:   29.6 mmHg MV Decel Time: 169 msec    TR Vmax:        272.00 cm/s MR Peak grad: 59.4 mmHg MR Vmax:      385.25 cm/s  SHUNTS MV E velocity: 64.70 cm/s  Systemic VTI:  0.16 m MV A velocity: 35.10 cm/s  Systemic Diam: 2.00 cm MV E/A ratio:  1.84 Eleonore Chiquito MD Electronically signed by Eleonore Chiquito MD Signature Date/Time: 05/04/2022/11:21:27 AM    Final    VAS US CAROTID  Result Date:  05/04/2022 Carotid Arterial Duplex Study Patient Name:  Jerry Wood  Date of Exam:   05/04/2022 Medical Rec #: [id number]       Accession #:    [id number] Date of Birth: 1921-12-26       Patient Gender: M Patient Age:   31 years Exam Location:  [facility] Procedure:      VAS US CAROTID Referring Phys: Janine Ores --------------------------------------------------------------------------------  Indications:       Speech disturbance and Visual disturbance. Risk Factors:      Hypertension, coronary artery disease. Comparison Study:  no prior Performing Technologist: Archie Patten RVS  Examination Guidelines: A complete evaluation includes B-mode imaging, spectral Doppler, color Doppler, and power Doppler as needed of all accessible portions of each vessel. Bilateral testing is considered an integral part of a complete examination. Limited examinations for reoccurring indications may be performed as noted.  Right Carotid Findings: +----------+--------+--------+--------+------------------+--------+  PSV cm/sEDV cm/sStenosisPlaque DescriptionComments +----------+--------+--------+--------+------------------+--------+ CCA Prox  58      12              heterogenous               +----------+--------+--------+--------+------------------+--------+ CCA Distal59      12              heterogenous               +----------+--------+--------+--------+------------------+--------+ ICA Prox  55      18      1-39%   heterogenous      tortuous +----------+--------+--------+--------+------------------+--------+ ICA Distal60      17                                         +----------+--------+--------+--------+------------------+--------+ ECA       88      7                                          +----------+--------+--------+--------+------------------+--------+ +----------+--------+-------+--------+-------------------+           PSV cm/sEDV cmsDescribeArm Pressure  (mmHG) +----------+--------+-------+--------+-------------------+ [id number]                                         +----------+--------+-------+--------+-------------------+ +---------+--------+--+--------+--+---------+ VertebralPSV cm/s46EDV cm/s10Antegrade +---------+--------+--+--------+--+---------+  Left Carotid Findings: +----------+--------+--------+--------+------------------+--------+           PSV cm/sEDV cm/sStenosisPlaque DescriptionComments +----------+--------+--------+--------+------------------+--------+ CCA Prox  60      14              heterogenous               +----------+--------+--------+--------+------------------+--------+ CCA Distal68      14              heterogenous               +----------+--------+--------+--------+------------------+--------+ ICA Prox  52      12      1-39%   heterogenous               +----------+--------+--------+--------+------------------+--------+ ICA Distal65      21                                         +----------+--------+--------+--------+------------------+--------+ ECA       93                                                 +----------+--------+--------+--------+------------------+--------+ +----------+--------+--------+--------+-------------------+           PSV cm/sEDV cm/sDescribeArm Pressure (mmHG) +----------+--------+--------+--------+-------------------+ [id number]                                          +----------+--------+--------+--------+-------------------+ +---------+--------+--+--------+--+---------+ VertebralPSV cm/s42EDV cm/s13Antegrade +---------+--------+--+--------+--+---------+   Summary: Right Carotid: Velocities in the right ICA are consistent with a 1-39% stenosis. Left Carotid: Velocities in the left  ICA are consistent with a 1-39% stenosis. Vertebrals: Bilateral vertebral arteries demonstrate antegrade flow. *See table(s) above for measurements and  observations.     Preliminary    DG Chest 1 View  Result Date: 05/03/2022 CLINICAL DATA:  [id number] Pneumonia [id number] EXAM: CHEST  1 VIEW COMPARISON:  Chest x-ray 01/26/2015 FINDINGS: Left chest wall 2 lead pacemaker in grossly appropriate position. The heart and mediastinal contours are unchanged. No focal consolidation. No pulmonary edema. No pleural effusion. No pneumothorax. No acute osseous abnormality. IMPRESSION: No active disease. Electronically Signed   By: Iven Finn M.D.   On: 05/03/2022 18:20   CT HEAD CODE STROKE WO CONTRAST  Result Date: 05/03/2022 CLINICAL DATA:  Code stroke. EXAM: CT HEAD WITHOUT CONTRAST TECHNIQUE: Contiguous axial images were obtained from the base of the skull through the vertex without intravenous contrast. RADIATION DOSE REDUCTION: This exam was performed according to the departmental dose-optimization program which includes automated exposure control, adjustment of the mA and/or kV according to patient size and/or use of iterative reconstruction technique. COMPARISON:  CT head 07/21/2012 FINDINGS: Brain: There is no acute intracranial hemorrhage, extra-axial fluid collection, or acute infarct. Parenchymal volume is normal for age. The ventricles are normal in size. Gray-white differentiation is preserved. There is a small remote infarct in the right caudate head and small remote infarcts in the bilateral cerebellar hemispheres, unchanged since 2014. There is no mass lesion.  There is no mass effect or midline shift. Vascular: No dense vessel is seen. There is calcification of the bilateral carotid siphons. Skull: Normal. Negative for fracture or focal lesion. Sinuses/Orbits: The paranasal sinuses are clear. Bilateral lens implants are in place. The globes and orbits are otherwise unremarkable. Other: None. ASPECTS [facility] Stroke Program Early CT Score) - Ganglionic level infarction (caudate, lentiform nuclei, internal capsule, insula, M1-M3 cortex): 7 -  Supraganglionic infarction (M4-M6 cortex): 3 Total score (0-10 with 10 being normal): 10 IMPRESSION: 1. No acute intracranial pathology. 2. ASPECTS is 10 Findings communicated to Dr. Rory Percy at 5:38 p.m. Electronically Signed   By: Valetta Mole M.D.   On: 05/03/2022 17:42   (Echo, Carotid, EGD, Colonoscopy, ERCP)    Subjective: Patient seen and examined.  Poor historian.  No family at the bedside.  He asked me to set him up in the bed.  Denied any complaints.   Discharge Exam: Vitals:   05/09/22 0352 05/09/22 0747  BP: 110/74 120/83  Pulse: 90 93  Resp: 16 18  Temp: 98.5 F (36.9 C) 98.2 F (36.8 C)  SpO2: 96% 98%   Vitals:   05/08/22 2340 05/09/22 0352 05/09/22 0500 05/09/22 0747  BP:  110/74  120/83  Pulse: 82 90  93  Resp: '18 16  18  '$ Temp:  98.5 F (36.9 C)  98.2 F (36.8 C)  TempSrc:  Oral  Oral  SpO2:  96%  98%  Weight:   72.3 kg     General: Pt is alert, awake, not in acute distress Difficult to assess orientation.  He is oriented to himself. Mucous membranes are dry.  Slow to respond. Cardiovascular: RRR, S1/S2 +, no rubs, no gallops, pacemaker left precordium. Respiratory: CTA bilaterally, on room air. Abdominal: Soft, NT, ND, bowel sounds +    The results of significant diagnostics from this hospitalization (including imaging, microbiology, ancillary and laboratory) are listed below for reference.     Microbiology: No results found for this or any previous visit (from the past 240 hour(s)).   Labs: BNP (last 3  results) No results for input(s): "BNP" in the last 8760 hours. Basic Metabolic Panel: Recent Labs  Lab 05/03/22 1721 05/03/22 1725 05/04/22 0410 05/05/22 0413 05/05/22 0414  NA 141 141  --  143  --   K 3.8 3.8  --  3.6  --   CL 103 102  --  109  --   CO2 26  --   --  22  --   GLUCOSE 109* 105*  --  107*  --   BUN 24* 25*  --  17  --   CREATININE 1.36* 1.40*  --  1.16  --   CALCIUM 9.1  --   --  8.8*  --   MG  --   --  2.2  --  2.0  PHOS   --   --   --  2.3*  --    Liver Function Tests: Recent Labs  Lab 05/03/22 1721 05/05/22 0413  AST 22  --   ALT 6  --   ALKPHOS 108  --   BILITOT 0.7  --   PROT 6.5  --   ALBUMIN 3.6 3.0*   No results for input(s): "LIPASE", "AMYLASE" in the last 168 hours. No results for input(s): "AMMONIA" in the last 168 hours. CBC: Recent Labs  Lab 05/03/22 1721 05/03/22 1725 05/04/22 0410  WBC 10.2  --  9.5  NEUTROABS 6.9  --   --   HGB 12.3* 12.6* 12.2*  HCT 37.9* 37.0* 36.9*  MCV 101.1*  --  98.9  PLT 248  --  225   Cardiac Enzymes: No results for input(s): "CKTOTAL", "CKMB", "CKMBINDEX", "TROPONINI" in the last 168 hours. BNP: Invalid input(s): "POCBNP" CBG: Recent Labs  Lab 05/03/22 1721  GLUCAP 108*   D-Dimer No results for input(s): "DDIMER" in the last 72 hours. Hgb A1c No results for input(s): "HGBA1C" in the last 72 hours. Lipid Profile No results for input(s): "CHOL", "HDL", "LDLCALC", "TRIG", "CHOLHDL", "LDLDIRECT" in the last 72 hours. Thyroid function studies No results for input(s): "TSH", "T4TOTAL", "T3FREE", "THYROIDAB" in the last 72 hours.  Invalid input(s): "FREET3" Anemia work up No results for input(s): "VITAMINB12", "FOLATE", "FERRITIN", "TIBC", "IRON", "RETICCTPCT" in the last 72 hours. Urinalysis    Component Value Date/Time   COLORURINE YELLOW 05/03/2022 1725   APPEARANCEUR HAZY (A) 05/03/2022 1725   LABSPEC 1.009 05/03/2022 1725   PHURINE 6.0 05/03/2022 1725   GLUCOSEU NEGATIVE 05/03/2022 1725   HGBUR NEGATIVE 05/03/2022 [place] 05/03/2022 [place] 05/03/2022 1725   PROTEINUR NEGATIVE 05/03/2022 1725   NITRITE NEGATIVE 05/03/2022 1725   LEUKOCYTESUR NEGATIVE 05/03/2022 1725   Sepsis Labs Recent Labs  Lab 05/03/22 1721 05/04/22 0410  WBC 10.2 9.5   Microbiology No results found for this or any previous visit (from the past 240 hour(s)).   Time coordinating discharge: 32  minutes  SIGNED:   Barb Merino, MD  [facility] 05/09/2022, 11:30 AM

## 2022-05-23 DIAGNOSIS — R41841 Cognitive communication deficit: Secondary | ICD-10-CM | POA: Diagnosis not present

## 2022-05-23 DIAGNOSIS — I6789 Other cerebrovascular disease: Secondary | ICD-10-CM | POA: Diagnosis not present

## 2022-05-23 DIAGNOSIS — Z8616 Personal history of COVID-19: Secondary | ICD-10-CM | POA: Diagnosis not present

## 2022-05-23 DIAGNOSIS — U071 COVID-19: Secondary | ICD-10-CM | POA: Diagnosis not present

## 2022-05-25 ENCOUNTER — Emergency Department (HOSPITAL_COMMUNITY): Payer: Medicare Other

## 2022-05-25 ENCOUNTER — Inpatient Hospital Stay (HOSPITAL_COMMUNITY)
Admission: EM | Admit: 2022-05-25 | Discharge: 2022-06-18 | DRG: 193 | Disposition: E | Payer: Medicare Other | Source: Skilled Nursing Facility | Attending: Internal Medicine | Admitting: Internal Medicine

## 2022-05-25 DIAGNOSIS — M25569 Pain in unspecified knee: Secondary | ICD-10-CM | POA: Diagnosis present

## 2022-05-25 DIAGNOSIS — R063 Periodic breathing: Secondary | ICD-10-CM | POA: Insufficient documentation

## 2022-05-25 DIAGNOSIS — I495 Sick sinus syndrome: Secondary | ICD-10-CM | POA: Diagnosis present

## 2022-05-25 DIAGNOSIS — R29818 Other symptoms and signs involving the nervous system: Secondary | ICD-10-CM | POA: Diagnosis not present

## 2022-05-25 DIAGNOSIS — E039 Hypothyroidism, unspecified: Secondary | ICD-10-CM | POA: Diagnosis present

## 2022-05-25 DIAGNOSIS — I482 Chronic atrial fibrillation, unspecified: Secondary | ICD-10-CM | POA: Diagnosis not present

## 2022-05-25 DIAGNOSIS — I509 Heart failure, unspecified: Secondary | ICD-10-CM | POA: Diagnosis present

## 2022-05-25 DIAGNOSIS — I2510 Atherosclerotic heart disease of native coronary artery without angina pectoris: Secondary | ICD-10-CM | POA: Diagnosis not present

## 2022-05-25 DIAGNOSIS — Z515 Encounter for palliative care: Secondary | ICD-10-CM

## 2022-05-25 DIAGNOSIS — J9811 Atelectasis: Secondary | ICD-10-CM | POA: Diagnosis present

## 2022-05-25 DIAGNOSIS — I130 Hypertensive heart and chronic kidney disease with heart failure and stage 1 through stage 4 chronic kidney disease, or unspecified chronic kidney disease: Secondary | ICD-10-CM | POA: Diagnosis present

## 2022-05-25 DIAGNOSIS — Z66 Do not resuscitate: Secondary | ICD-10-CM | POA: Diagnosis present

## 2022-05-25 DIAGNOSIS — E860 Dehydration: Secondary | ICD-10-CM | POA: Diagnosis not present

## 2022-05-25 DIAGNOSIS — Z7189 Other specified counseling: Secondary | ICD-10-CM

## 2022-05-25 DIAGNOSIS — G20A1 Parkinson's disease without dyskinesia, without mention of fluctuations: Secondary | ICD-10-CM | POA: Diagnosis not present

## 2022-05-25 DIAGNOSIS — B9781 Human metapneumovirus as the cause of diseases classified elsewhere: Secondary | ICD-10-CM | POA: Diagnosis present

## 2022-05-25 DIAGNOSIS — R069 Unspecified abnormalities of breathing: Principal | ICD-10-CM

## 2022-05-25 DIAGNOSIS — Z8249 Family history of ischemic heart disease and other diseases of the circulatory system: Secondary | ICD-10-CM

## 2022-05-25 DIAGNOSIS — N1831 Chronic kidney disease, stage 3a: Secondary | ICD-10-CM | POA: Diagnosis present

## 2022-05-25 DIAGNOSIS — Z79899 Other long term (current) drug therapy: Secondary | ICD-10-CM

## 2022-05-25 DIAGNOSIS — B348 Other viral infections of unspecified site: Secondary | ICD-10-CM | POA: Insufficient documentation

## 2022-05-25 DIAGNOSIS — Z950 Presence of cardiac pacemaker: Secondary | ICD-10-CM | POA: Diagnosis not present

## 2022-05-25 DIAGNOSIS — Z79890 Hormone replacement therapy (postmenopausal): Secondary | ICD-10-CM

## 2022-05-25 DIAGNOSIS — E870 Hyperosmolality and hypernatremia: Secondary | ICD-10-CM | POA: Diagnosis not present

## 2022-05-25 DIAGNOSIS — G8929 Other chronic pain: Secondary | ICD-10-CM | POA: Diagnosis present

## 2022-05-25 DIAGNOSIS — R531 Weakness: Secondary | ICD-10-CM | POA: Diagnosis not present

## 2022-05-25 DIAGNOSIS — U071 COVID-19: Secondary | ICD-10-CM

## 2022-05-25 DIAGNOSIS — Z7901 Long term (current) use of anticoagulants: Secondary | ICD-10-CM

## 2022-05-25 DIAGNOSIS — I340 Nonrheumatic mitral (valve) insufficiency: Secondary | ICD-10-CM | POA: Diagnosis not present

## 2022-05-25 DIAGNOSIS — Z8616 Personal history of COVID-19: Secondary | ICD-10-CM | POA: Diagnosis not present

## 2022-05-25 DIAGNOSIS — J440 Chronic obstructive pulmonary disease with acute lower respiratory infection: Secondary | ICD-10-CM | POA: Diagnosis present

## 2022-05-25 DIAGNOSIS — R451 Restlessness and agitation: Secondary | ICD-10-CM

## 2022-05-25 DIAGNOSIS — Z87891 Personal history of nicotine dependence: Secondary | ICD-10-CM

## 2022-05-25 DIAGNOSIS — E559 Vitamin D deficiency, unspecified: Secondary | ICD-10-CM | POA: Diagnosis present

## 2022-05-25 DIAGNOSIS — E7800 Pure hypercholesterolemia, unspecified: Secondary | ICD-10-CM | POA: Diagnosis not present

## 2022-05-25 DIAGNOSIS — R7989 Other specified abnormal findings of blood chemistry: Secondary | ICD-10-CM | POA: Diagnosis not present

## 2022-05-25 DIAGNOSIS — F419 Anxiety disorder, unspecified: Secondary | ICD-10-CM | POA: Diagnosis present

## 2022-05-25 DIAGNOSIS — R0902 Hypoxemia: Secondary | ICD-10-CM

## 2022-05-25 DIAGNOSIS — J9601 Acute respiratory failure with hypoxia: Secondary | ICD-10-CM | POA: Diagnosis present

## 2022-05-25 DIAGNOSIS — I69354 Hemiplegia and hemiparesis following cerebral infarction affecting left non-dominant side: Secondary | ICD-10-CM

## 2022-05-25 DIAGNOSIS — M1990 Unspecified osteoarthritis, unspecified site: Secondary | ICD-10-CM | POA: Diagnosis present

## 2022-05-25 DIAGNOSIS — R0600 Dyspnea, unspecified: Secondary | ICD-10-CM | POA: Diagnosis not present

## 2022-05-25 DIAGNOSIS — I639 Cerebral infarction, unspecified: Secondary | ICD-10-CM | POA: Diagnosis present

## 2022-05-25 DIAGNOSIS — Z8673 Personal history of transient ischemic attack (TIA), and cerebral infarction without residual deficits: Secondary | ICD-10-CM | POA: Diagnosis not present

## 2022-05-25 DIAGNOSIS — I4821 Permanent atrial fibrillation: Secondary | ICD-10-CM | POA: Diagnosis present

## 2022-05-25 DIAGNOSIS — B351 Tinea unguium: Secondary | ICD-10-CM | POA: Diagnosis present

## 2022-05-25 DIAGNOSIS — E861 Hypovolemia: Secondary | ICD-10-CM | POA: Diagnosis present

## 2022-05-25 DIAGNOSIS — R52 Pain, unspecified: Secondary | ICD-10-CM

## 2022-05-25 DIAGNOSIS — I10 Essential (primary) hypertension: Secondary | ICD-10-CM | POA: Diagnosis not present

## 2022-05-25 DIAGNOSIS — I672 Cerebral atherosclerosis: Secondary | ICD-10-CM | POA: Diagnosis not present

## 2022-05-25 DIAGNOSIS — J811 Chronic pulmonary edema: Secondary | ICD-10-CM | POA: Diagnosis not present

## 2022-05-25 DIAGNOSIS — U099 Post covid-19 condition, unspecified: Secondary | ICD-10-CM | POA: Diagnosis present

## 2022-05-25 DIAGNOSIS — J309 Allergic rhinitis, unspecified: Secondary | ICD-10-CM | POA: Diagnosis present

## 2022-05-25 DIAGNOSIS — R54 Age-related physical debility: Secondary | ICD-10-CM | POA: Diagnosis present

## 2022-05-25 DIAGNOSIS — J1289 Other viral pneumonia: Secondary | ICD-10-CM | POA: Diagnosis present

## 2022-05-25 DIAGNOSIS — R0602 Shortness of breath: Secondary | ICD-10-CM | POA: Diagnosis not present

## 2022-05-25 DIAGNOSIS — Z993 Dependence on wheelchair: Secondary | ICD-10-CM

## 2022-05-25 DIAGNOSIS — M109 Gout, unspecified: Secondary | ICD-10-CM | POA: Diagnosis present

## 2022-05-25 DIAGNOSIS — Z9861 Coronary angioplasty status: Secondary | ICD-10-CM

## 2022-05-25 DIAGNOSIS — R471 Dysarthria and anarthria: Secondary | ICD-10-CM | POA: Diagnosis present

## 2022-05-25 LAB — CBC WITH DIFFERENTIAL/PLATELET
Abs Immature Granulocytes: 0.21 10*3/uL — ABNORMAL HIGH (ref 0.00–0.07)
Basophils Absolute: 0 10*3/uL (ref 0.0–0.1)
Basophils Relative: 0 %
Eosinophils Absolute: 0 10*3/uL (ref 0.0–0.5)
Eosinophils Relative: 0 %
HCT: 40 % (ref 39.0–52.0)
Hemoglobin: 12.2 g/dL — ABNORMAL LOW (ref 13.0–17.0)
Immature Granulocytes: 2 %
Lymphocytes Relative: 5 %
Lymphs Abs: 0.6 10*3/uL — ABNORMAL LOW (ref 0.7–4.0)
MCH: 31.6 pg (ref 26.0–34.0)
MCHC: 30.5 g/dL (ref 30.0–36.0)
MCV: 103.6 fL — ABNORMAL HIGH (ref 80.0–100.0)
Monocytes Absolute: 0.5 10*3/uL (ref 0.1–1.0)
Monocytes Relative: 4 %
Neutro Abs: 10.6 10*3/uL — ABNORMAL HIGH (ref 1.7–7.7)
Neutrophils Relative %: 89 %
Platelets: 236 10*3/uL (ref 150–400)
RBC: 3.86 MIL/uL — ABNORMAL LOW (ref 4.22–5.81)
RDW: 16.7 % — ABNORMAL HIGH (ref 11.5–15.5)
WBC: 12 10*3/uL — ABNORMAL HIGH (ref 4.0–10.5)
nRBC: 3.5 % — ABNORMAL HIGH (ref 0.0–0.2)

## 2022-05-25 LAB — URINALYSIS, ROUTINE W REFLEX MICROSCOPIC
Bilirubin Urine: NEGATIVE
Glucose, UA: NEGATIVE mg/dL
Hgb urine dipstick: NEGATIVE
Ketones, ur: 5 mg/dL — AB
Leukocytes,Ua: NEGATIVE
Nitrite: NEGATIVE
Protein, ur: 100 mg/dL — AB
Specific Gravity, Urine: 1.025 (ref 1.005–1.030)
pH: 5 (ref 5.0–8.0)

## 2022-05-25 LAB — COMPREHENSIVE METABOLIC PANEL
ALT: 6 U/L (ref 0–44)
AST: 36 U/L (ref 15–41)
Albumin: 2.7 g/dL — ABNORMAL LOW (ref 3.5–5.0)
Alkaline Phosphatase: 68 U/L (ref 38–126)
Anion gap: 8 (ref 5–15)
BUN: 23 mg/dL (ref 8–23)
CO2: 21 mmol/L — ABNORMAL LOW (ref 22–32)
Calcium: 8.6 mg/dL — ABNORMAL LOW (ref 8.9–10.3)
Chloride: 122 mmol/L — ABNORMAL HIGH (ref 98–111)
Creatinine, Ser: 1.21 mg/dL (ref 0.61–1.24)
GFR, Estimated: 54 mL/min — ABNORMAL LOW (ref >60)
Glucose, Bld: 112 mg/dL — ABNORMAL HIGH (ref 70–99)
Potassium: 4.8 mmol/L (ref 3.5–5.1)
Sodium: 151 mmol/L — ABNORMAL HIGH (ref 135–145)
Total Bilirubin: 1.1 mg/dL (ref 0.3–1.2)
Total Protein: 6.2 g/dL — ABNORMAL LOW (ref 6.5–8.1)

## 2022-05-25 LAB — BLOOD GAS, VENOUS
Acid-base deficit: 1.4 mmol/L (ref 0.0–2.0)
Bicarbonate: 22.8 mmol/L (ref 20.0–28.0)
O2 Saturation: 92.7 %
Patient temperature: 37
pCO2, Ven: 36 mmHg — ABNORMAL LOW (ref 44–60)
pH, Ven: 7.41 (ref 7.25–7.43)
pO2, Ven: 63 mmHg — ABNORMAL HIGH (ref 32–45)

## 2022-05-25 LAB — TROPONIN I (HIGH SENSITIVITY)
Troponin I (High Sensitivity): 68 ng/L — ABNORMAL HIGH (ref <18)
Troponin I (High Sensitivity): 70 ng/L — ABNORMAL HIGH (ref <18)

## 2022-05-25 LAB — LACTIC ACID, PLASMA
Lactic Acid, Venous: 1.9 mmol/L (ref 0.5–1.9)
Lactic Acid, Venous: 1.5 mmol/L (ref 0.5–1.9)

## 2022-05-25 LAB — PROCALCITONIN: Procalcitonin: <0.1 ng/mL

## 2022-05-25 LAB — BRAIN NATRIURETIC PEPTIDE: B Natriuretic Peptide: 771.4 pg/mL — ABNORMAL HIGH (ref 0.0–100.0)

## 2022-05-25 LAB — D-DIMER, QUANTITATIVE: D-Dimer, Quant: 0.7 ug/mL-FEU — ABNORMAL HIGH (ref 0.00–0.50)

## 2022-05-25 MED ORDER — ONDANSETRON HCL 4 MG PO TABS: 4.0000 mg | ORAL_TABLET | Freq: Four times a day (QID) | ORAL | Status: DC | PRN

## 2022-05-25 MED ORDER — GLYCOPYRROLATE 0.2 MG/ML IJ SOLN
0.1000 mg | Freq: Once | INTRAMUSCULAR | Status: AC
  Administered 2022-05-25: 0.1 mg via INTRAVENOUS
  Filled 2022-05-25: qty 1

## 2022-05-25 MED ORDER — SODIUM CHLORIDE 3 % IN NEBU
4.0000 mL | INHALATION_SOLUTION | Freq: Every day | RESPIRATORY_TRACT | Status: DC
  Administered 2022-05-25: 4 mL via RESPIRATORY_TRACT
  Filled 2022-05-25: qty 4

## 2022-05-25 MED ORDER — ENOXAPARIN SODIUM 80 MG/0.8ML IJ SOSY
1.0000 mg/kg | PREFILLED_SYRINGE | INTRAMUSCULAR | Status: DC
  Administered 2022-05-25: 67.5 mg via SUBCUTANEOUS
  Filled 2022-05-25: qty 0.68

## 2022-05-25 MED ORDER — IPRATROPIUM BROMIDE 0.02 % IN SOLN
0.5000 mg | Freq: Four times a day (QID) | RESPIRATORY_TRACT | Status: DC
  Administered 2022-05-25 – 2022-05-26 (×3): 0.5 mg via RESPIRATORY_TRACT
  Filled 2022-05-25 (×3): qty 2.5

## 2022-05-25 MED ORDER — LEVALBUTEROL HCL 0.63 MG/3ML IN NEBU
0.6300 mg | INHALATION_SOLUTION | Freq: Four times a day (QID) | RESPIRATORY_TRACT | Status: DC
  Administered 2022-05-25 – 2022-05-26 (×4): 0.63 mg via RESPIRATORY_TRACT
  Filled 2022-05-25 (×4): qty 3

## 2022-05-25 MED ORDER — METOPROLOL TARTRATE 5 MG/5ML IV SOLN
5.0000 mg | Freq: Three times a day (TID) | INTRAVENOUS | Status: DC
  Administered 2022-05-25 – 2022-05-26 (×2): 5 mg via INTRAVENOUS
  Filled 2022-05-25 (×2): qty 5

## 2022-05-25 MED ORDER — ACETAMINOPHEN 325 MG PO TABS: 650.0000 mg | ORAL_TABLET | Freq: Four times a day (QID) | ORAL | Status: DC | PRN

## 2022-05-25 MED ORDER — METHYLPREDNISOLONE SODIUM SUCC 40 MG IJ SOLR
40.0000 mg | Freq: Two times a day (BID) | INTRAMUSCULAR | Status: DC
  Administered 2022-05-25 – 2022-05-26 (×2): 40 mg via INTRAVENOUS
  Filled 2022-05-25 (×2): qty 1

## 2022-05-25 MED ORDER — DEXTROSE 5 % IV SOLN: INTRAVENOUS | Status: DC

## 2022-05-25 MED ORDER — CHLORHEXIDINE GLUCONATE CLOTH 2 % EX PADS
6.0000 | MEDICATED_PAD | Freq: Every day | CUTANEOUS | Status: DC
  Administered 2022-05-27: 6 via TOPICAL

## 2022-05-25 MED ORDER — SODIUM CHLORIDE 0.9% FLUSH: 3.0000 mL | Freq: Two times a day (BID) | INTRAVENOUS | Status: DC

## 2022-05-25 MED ORDER — ORAL CARE MOUTH RINSE: 15.0000 mL | OROMUCOSAL | Status: DC

## 2022-05-25 MED ORDER — ONDANSETRON HCL 4 MG/2ML IJ SOLN: 4.0000 mg | Freq: Four times a day (QID) | INTRAMUSCULAR | Status: DC | PRN

## 2022-05-25 MED ORDER — SODIUM CHLORIDE 0.9 % IV SOLN
3.0000 g | Freq: Two times a day (BID) | INTRAVENOUS | Status: DC
  Administered 2022-05-25: 3 g via INTRAVENOUS
  Filled 2022-05-25: qty 8

## 2022-05-25 MED ORDER — SODIUM CHLORIDE 0.45 % IV BOLUS
1000.0000 mL | Freq: Once | INTRAVENOUS | Status: AC
  Administered 2022-05-25: 1000 mL via INTRAVENOUS

## 2022-05-25 MED ORDER — ORAL CARE MOUTH RINSE: 15.0000 mL | OROMUCOSAL | Status: DC | PRN

## 2022-05-25 MED ORDER — ACETAMINOPHEN 650 MG RE SUPP: 650.0000 mg | Freq: Four times a day (QID) | RECTAL | Status: DC | PRN

## 2022-05-25 MED ORDER — METOPROLOL TARTRATE 5 MG/5ML IV SOLN: 2.5000 mg | Freq: Three times a day (TID) | INTRAVENOUS | Status: DC

## 2022-05-25 MED ORDER — ENOXAPARIN SODIUM 80 MG/0.8ML IJ SOSY
1.0000 mg/kg | PREFILLED_SYRINGE | Freq: Two times a day (BID) | INTRAMUSCULAR | Status: DC
  Filled 2022-05-25: qty 0.68

## 2022-05-25 MED ORDER — LEVOTHYROXINE SODIUM 100 MCG/5ML IV SOLN: 56.2500 ug | Freq: Every day | INTRAVENOUS | Status: DC

## 2022-05-25 NOTE — Assessment & Plan Note (Addendum)
-  Noted to have history of recent CVA in the setting of A-fib felt likely embolic in nature. -Patient discharged on Eliquis. -Hold Eliquis due to current respiratory issues and placed on full dose Lovenox. -Hold statin, Zetia. -N.p.o. until reevaluation by speech therapy.

## 2022-05-25 NOTE — Assessment & Plan Note (Signed)
-  IV Lopressor.

## 2022-05-25 NOTE — Assessment & Plan Note (Signed)
-  See chronic A-fib above.

## 2022-05-25 NOTE — Assessment & Plan Note (Addendum)
-  Likely secondary to hypovolemic hypernatremia. -Patient recently hospitalized for acute CVA and discharged on 05/09/2022. -Patient also noted to be recently diagnosed with COVID-19 per daughter on 05/17/2022 and treated for pneumonia with antibiotics. -Patient noted with poor oral intake. -Status post half-normal saline 1 L bolus x 1.   -Patient also given D5W with some improvement with sodium down to 146 on 05/26/2022.  -Patient seen by palliative care and decision made to transition to full comfort measures.

## 2022-05-25 NOTE — Assessment & Plan Note (Signed)
-  Patient now comfort measures.

## 2022-05-25 NOTE — Consult Note (Signed)
NAME:  Jerry Wood, MRN:  [id number], DOB:  [birth year], LOS: 0 ADMISSION DATE:  06/13/2022, CONSULTATION DATE:  06/14/2022 REFERRING MD:  Tegeler, CHIEF COMPLAINT:  tachypnea, apnea   History of Present Illness:  88yM with history of PD, pAF on eliquis, HTN, tachy brady syndrome s/p PPM , CAD sp PCI, CKD, wheelchair bound who had recent admission for CVA 11/22 with left facial weakness and left sided weakness. He was discharged to SNF. Had covid-19 a week ago. ?Nonverbal at baseline but able to answer simple questions for [place] at bedside. BIBEMS for tachypnea/apnea, tachycardia. He had been given possibly a couple liters of sq fluids, still running.   In ED noted to have ataxic vs cheyne stokes breathing pattern. Hypoxic and started on 2L [place].   Pertinent  Medical History  PD pAF HTN Tachy brady syndrome CAD sp PCI CKD Recent CVA  Significant Hospital Events: Including procedures, antibiotic start and stop dates in addition to other pertinent events   12/8 admitted to stepdown  Interim History / Subjective:    Objective   Blood pressure 122/78, pulse (!) 170, temperature 99 F (37.2 C), temperature source Rectal, resp. rate (!) 21, SpO2 97 %.       No intake or output data in the 24 hours ending 05/21/2022 1656 There were no vitals filed for this visit.  Examination: General appearance: 88 y.o., male, drowsy, frail Eyes: pupils equal, tracking appropriately HENT: NCAT; dry MM, rattling upper airway secretions Neck: Trachea midline; no lymphadenopathy, no JVD Lungs: Rhonchorous, ataxic breathing pattern CV: tachy IR Abdomen: Soft, non-tender; non-distended, BS present, sq fluids running Extremities: trace-1+ peripheral edema, warm Neuro: weaker on left, can follow commands   Resolved Hospital Problem list    Assessment & Plan:   # Acute hypoxic respiratory insufficiency # Ataxic vs cheyne stokes breathing pattern vs PD related dysfunctional breathing - related to prior  stroke vs CHF (BNP elevated, cardiomegaly on CXR and IVC full wo resp variation on bedside TTE - otherwise difficult windows) - CTH - would repeat CXR hard to tell if there's LLL infiltrate due to positioning - could check vbg to see if there's any indication this dysfunctional breathing pattern is resulting in hypercapnia. I'm not completely convinced that treating his hypercapnia with BiPAP will help him though and I worry about his ability to manage secretions. I agree that this would probably be a good time to engage PMT. - TTE  - can offer NT suctioning but that seems uncomfortable  Will sign off but glad to be reinvolved as condition changes  Best Practice (right click and "Reselect all SmartList Selections" daily)   Per trh  Labs   CBC: Recent Labs  Lab 06/07/2022 1520  WBC 12.0*  NEUTROABS 10.6*  HGB 12.2*  HCT 40.0  MCV 103.6*  PLT 803    Basic Metabolic Panel: Recent Labs  Lab 06/11/2022 1600  NA 151*  K 4.8  CL 122*  CO2 21*  GLUCOSE 112*  BUN 23  CREATININE 1.21  CALCIUM 8.6*   GFR: CrCl cannot be calculated (Unknown ideal weight.). Recent Labs  Lab 05/24/2022 1520  WBC 12.0*  LATICACIDVEN 1.9    Liver Function Tests: Recent Labs  Lab 06/14/2022 1600  AST 36  ALT 6  ALKPHOS 68  BILITOT 1.1  PROT 6.2*  ALBUMIN 2.7*   No results for input(s): "LIPASE", "AMYLASE" in the last 168 hours. No results for input(s): "AMMONIA" in the last 168 hours.  ABG  Component Value Date/Time   TCO2 27 05/03/2022 1725     Coagulation Profile: No results for input(s): "INR", "PROTIME" in the last 168 hours.  Cardiac Enzymes: No results for input(s): "CKTOTAL", "CKMB", "CKMBINDEX", "TROPONINI" in the last 168 hours.  HbA1C: Hgb A1c MFr Bld  Date/Time Value Ref Range Status  05/04/2022 04:10 AM 5.1 4.8 - 5.6 % Final    Comment:    (NOTE) Pre diabetes:          5.7%-6.4%  Diabetes:              >6.4%  Glycemic control for   <7.0% adults with diabetes      CBG: No results for input(s): "GLUCAP" in the last 168 hours.  Review of Systems:   Unable to obtain in setting of dysarthria  Past Medical History:  He,  has a past medical history of Allergic rhinitis, Cancer ([place]) (09/2015), Chronic knee pain, Constipation, COPD, mild ([place]), Coronary artery disease, Diverticulosis, Dyslipidemia, Gout, Hypertension, Long term (current) use of anticoagulants, Onychomycosis, Osteoarthrosis, unspecified whether generalized or localized, other specified sites, Parkinson's disease (03/2012), Permanent atrial fibrillation ([place]), Pure hypercholesterolemia, Seborrheic keratosis, inflamed, Skin lesion, Tachy-brady syndrome ([place]), Unspecified hypothyroidism, Vertigo, and Vitamin D deficiency.   Surgical History:   Past Surgical History:  Procedure Laterality Date   BIV PACEMAKER GENERATOR CHANGEOUT N/A 03/13/2022   Procedure: BIV PACEMAKER GENERATOR CHANGEOUT;  Surgeon: Vickie Epley, MD;  Location: [facility];  Service: Cardiovascular;  Laterality: N/A;   FRACTURE SURGERY Right    clavicle   INGUINAL HERNIA REPAIR Bilateral    LAPAROSCOPIC PARTIAL COLECTOMY     Partial resection of sigmoid colon   PACEMAKER PLACEMENT  2003   Tachybradycardia syndrome. Gen change 07/29/08, Dr. Leonia Reeves   TYMPANOPLASTY Bilateral      Social History:   reports that he quit smoking about 73 years ago. His smoking use included cigarettes. He has been exposed to tobacco smoke. He has never used smokeless tobacco. He reports that he does not drink alcohol and does not use drugs.   Family History:  His family history includes Bladder Cancer in an other family member; CAD in his father; Diabetes in his father; Leukemia in an other family member.   Allergies No Known Allergies   Home Medications  Prior to Admission medications   Medication Sig Start Date End Date Taking? Authorizing Provider  dexamethasone (DECADRON) 1 MG tablet Take 1 mg by mouth once. 05/23/22   Yes [provider]  ipratropium-albuterol (DUONEB) 0.5-2.5 (3) MG/3ML SOLN Take by nebulization. 05/22/22  Yes [provider]  losartan (COZAAR) 50 MG tablet Take 50 mg by mouth daily. 05/17/22  Yes [provider]  nystatin ointment (MYCOSTATIN) Apply topically. 05/23/22  Yes [provider]  acetaminophen (TYLENOL) 500 MG tablet Take 1 tablet (500 mg total) by mouth every 6 (six) hours. 05/08/22   Aline August, MD  apixaban (ELIQUIS) 5 MG TABS tablet TAKE 1 TABLET(5 MG) BY MOUTH TWICE DAILY Patient taking differently: Take 5 mg by mouth 2 (two) times daily. 04/27/22   Camnitz, Ocie Doyne, MD  calcium carbonate (TUMS - DOSED IN MG ELEMENTAL CALCIUM) 500 MG chewable tablet Chew 1 tablet by mouth daily as needed for indigestion or heartburn.    [provider]  carbidopa-levodopa (SINEMET IR) 25-100 MG tablet TAKE 2 TABLETS BY MOUTH THREE TIMES DAILY Patient taking differently: Take 2 tablets by mouth 3 (three) times daily. 10/10/21   Penumalli, [name], MD  ezetimibe (ZETIA) 10 MG tablet TAKE 1 TABLET(10 MG) BY MOUTH DAILY Patient taking differently: Take 10 mg by mouth daily. 02/13/22   Sueanne Margarita, MD  levothyroxine (SYNTHROID, LEVOTHROID) 75 MCG tablet Take 75 mcg by mouth daily before breakfast.  11/15/12   [provider]  loratadine (CLARITIN) 10 MG tablet Take 10 mg by mouth daily.    [provider]  midodrine (PROAMATINE) 5 MG tablet Take 1 tablet (5 mg total) by mouth 3 (three) times daily with meals. 05/08/22   Aline August, MD  Multiple Vitamin (MULTIVITAMIN) capsule Take 1 capsule by mouth daily.    [provider]  pravastatin (PRAVACHOL) 40 MG tablet Take 1 tablet (40 mg total) by mouth every evening. 05/08/22   Aline August, MD  senna-docusate (COLACE 2-IN-1) 8.6-50 MG tablet Take 1 tablet by mouth daily.    [provider]  vitamin B-12 (CYANOCOBALAMIN) 1000 MCG tablet Take 1,000 mcg by mouth  daily.    [provider]     Critical care time: na

## 2022-05-25 NOTE — ED Notes (Signed)
Discontinued subcutaneous infusion (normal saline) which was already established in pt's RLQ.

## 2022-05-25 NOTE — Assessment & Plan Note (Signed)
-  Patient with history of tachybradycardia syndrome status post PPM. -Placed back on IV Lopressor every 8 hours.  -2D echo done with EF of 55 to 60%, NWMA, severely dilated left atrial size and severely dilated right atrial size.  Moderate to severe MVR that appears to have progressed significantly compared to prior 2D echo . -Patient noted to have been on Eliquis prior to admission, on admission placed on full dose Lovenox which has been discontinued as patient seen by palliative care and decision made to transition to full comfort measures.

## 2022-05-25 NOTE — ED Triage Notes (Signed)
BIB by EMS from [facility] for hypernatremia, Covid positive 05/17/2022 138/98 128 HR 99% [place] 2 lpm 146 cbg

## 2022-05-25 NOTE — Assessment & Plan Note (Signed)
-  Hold home regimen of medications now until patient is more alert and tolerating oral intake.

## 2022-05-25 NOTE — Assessment & Plan Note (Addendum)
-  Per daughter patient recently diagnosed with COVID-19 infection 05/17/2022 and treated for pneumonia with antibiotics at that time.  Patient noted to also have been on steroids as well. -Patient seen by palliative care and decision made to transition to full comfort measures.

## 2022-05-25 NOTE — Assessment & Plan Note (Signed)
-??    Etiology -Head CT negative for any acute CVA. -See acute respiratory failure with hypoxia above.

## 2022-05-25 NOTE — ED Provider Notes (Signed)
[facility] DEPT Provider Note   CSN: [id number] Arrival date & time: 05/23/2022  1453     History  No chief complaint on file.   Jerry Wood is a 88 y.o. male.  The history is provided by the EMS personnel and medical records (ems report to nursing and documentation with patient). The history is limited by the condition of the patient.  Shortness of Breath Severity:  Severe Onset quality:  Unable to specify Timing:  Constant Progression:  Worsening Chronicity:  New Context: URI   Relieved by:  Nothing Worsened by:  Nothing Ineffective treatments:  None tried Associated symptoms: cough   Associated symptoms: no fever, no rash and no vomiting    LVL 5 caveat for AMS and nonverbal staus     Home Medications Prior to Admission medications   Medication Sig Start Date End Date Taking? Authorizing Provider  acetaminophen (TYLENOL) 500 MG tablet Take 1,000 mg by mouth every 6 (six) hours as needed for moderate pain.    [provider]  acetaminophen (TYLENOL) 500 MG tablet Take 1 tablet (500 mg total) by mouth every 6 (six) hours. 05/08/22   Aline August, MD  apixaban (ELIQUIS) 5 MG TABS tablet TAKE 1 TABLET(5 MG) BY MOUTH TWICE DAILY Patient taking differently: Take 5 mg by mouth 2 (two) times daily. 04/27/22   Camnitz, Ocie Doyne, MD  calcium carbonate (TUMS - DOSED IN MG ELEMENTAL CALCIUM) 500 MG chewable tablet Chew 1 tablet by mouth daily as needed for indigestion or heartburn.    [provider]  carbidopa-levodopa (SINEMET IR) 25-100 MG tablet TAKE 2 TABLETS BY MOUTH THREE TIMES DAILY Patient taking differently: Take 2 tablets by mouth 3 (three) times daily. 10/10/21   Penumalli, [name], MD  ezetimibe (ZETIA) 10 MG tablet TAKE 1 TABLET(10 MG) BY MOUTH DAILY Patient taking differently: Take 10 mg by mouth daily. 02/13/22   Sueanne Margarita, MD  levothyroxine (SYNTHROID, LEVOTHROID) 75 MCG tablet Take 75 mcg by mouth daily  before breakfast.  11/15/12   [provider]  loratadine (CLARITIN) 10 MG tablet Take 10 mg by mouth daily.    [provider]  midodrine (PROAMATINE) 5 MG tablet Take 1 tablet (5 mg total) by mouth 3 (three) times daily with meals. 05/08/22   Aline August, MD  Multiple Vitamin (MULTIVITAMIN) capsule Take 1 capsule by mouth daily.    [provider]  pravastatin (PRAVACHOL) 40 MG tablet Take 1 tablet (40 mg total) by mouth every evening. 05/08/22   Aline August, MD  senna-docusate (COLACE 2-IN-1) 8.6-50 MG tablet Take 1 tablet by mouth daily.    [provider]  vitamin B-12 (CYANOCOBALAMIN) 1000 MCG tablet Take 1,000 mcg by mouth daily.    [provider]      Allergies    Patient has no known allergies.    Review of Systems   Review of Systems  Unable to perform ROS: Patient nonverbal  Constitutional:  Negative for fever.  HENT:  Positive for congestion.   Respiratory:  Positive for cough and shortness of breath.   Gastrointestinal:  Negative for constipation, diarrhea and vomiting.  Skin:  Negative for rash.  Psychiatric/Behavioral:  Negative for agitation.     Physical Exam Updated Vital Signs BP (!) 153/115 (BP Location: Right Arm)   Pulse (!) 58   Temp 98.1 F (36.7 C) (Oral)   Resp 18   SpO2 97%  Physical Exam Vitals and nursing note reviewed.  Constitutional:      General: He is not in acute distress.    Appearance: He is well-developed. He is ill-appearing.  HENT:     Head: Normocephalic and atraumatic.     Nose: Congestion present.     Mouth/Throat:     Mouth: Mucous membranes are dry.  Eyes:     Extraocular Movements: Extraocular movements intact.     Conjunctiva/sclera: Conjunctivae normal.     Pupils: Pupils are equal, round, and reactive to light.  Cardiovascular:     Rate and Rhythm: Regular rhythm. Tachycardia present.     Heart sounds: No murmur heard. Pulmonary:     Effort: No respiratory distress.      Breath sounds: Rhonchi present. No wheezing or rales.  Chest:     Chest wall: No tenderness.  Abdominal:     General: There is no distension.     Palpations: Abdomen is soft.     Tenderness: There is no abdominal tenderness. There is no guarding or rebound.  Musculoskeletal:        General: No swelling or tenderness.     Cervical back: Neck supple. No tenderness.     Right lower leg: Edema present.     Left lower leg: Edema present.  Skin:    General: Skin is warm and dry.     Capillary Refill: Capillary refill takes less than 2 seconds.     Findings: No erythema or rash.  Neurological:     Mental Status: He is alert.     ED Results / Procedures / Treatments   Labs (all labs ordered are listed, but only abnormal results are displayed) Labs Reviewed  CBC WITH DIFFERENTIAL/PLATELET - Abnormal; Notable for the following components:      Result Value   WBC 12.0 (*)    RBC 3.86 (*)    Hemoglobin 12.2 (*)    MCV 103.6 (*)    RDW 16.7 (*)    nRBC 3.5 (*)    Neutro Abs 10.6 (*)    Lymphs Abs 0.6 (*)    Abs Immature Granulocytes 0.21 (*)    All other components within normal limits  BRAIN NATRIURETIC PEPTIDE - Abnormal; Notable for the following components:   B Natriuretic Peptide 771.4 (*)    All other components within normal limits  COMPREHENSIVE METABOLIC PANEL - Abnormal; Notable for the following components:   Sodium 151 (*)    Chloride 122 (*)    CO2 21 (*)    Glucose, Bld 112 (*)    Calcium 8.6 (*)    Total Protein 6.2 (*)    Albumin 2.7 (*)    GFR, Estimated 54 (*)    All other components within normal limits  BLOOD GAS, VENOUS - Abnormal; Notable for the following components:   pCO2, Ven 36 (*)    pO2, Ven 63 (*)    All other components within normal limits  URINALYSIS, ROUTINE W REFLEX MICROSCOPIC - Abnormal; Notable for the following components:   Ketones, ur 5 (*)    Protein, ur 100 (*)    Bacteria, UA RARE (*)    All other components within normal  limits  D-DIMER, QUANTITATIVE - Abnormal; Notable for the following components:   D-Dimer, Quant 0.70 (*)    All other components within normal limits  TROPONIN I (HIGH SENSITIVITY) - Abnormal; Notable for the following components:   Troponin I (High Sensitivity) 68 (*)    All other components within normal limits  TROPONIN I (  HIGH SENSITIVITY) - Abnormal; Notable for the following components:   Troponin I (High Sensitivity) 70 (*)    All other components within normal limits  CULTURE, BLOOD (ROUTINE X 2)  CULTURE, BLOOD (ROUTINE X 2)  EXPECTORATED SPUTUM ASSESSMENT W GRAM STAIN, RFLX TO RESP C  RESPIRATORY PANEL BY PCR  LACTIC ACID, PLASMA  LACTIC ACID, PLASMA  PROCALCITONIN  MAGNESIUM  CBC WITH DIFFERENTIAL/PLATELET  COMPREHENSIVE METABOLIC PANEL  C-REACTIVE PROTEIN  C-REACTIVE PROTEIN  BRAIN NATRIURETIC PEPTIDE  D-DIMER, QUANTITATIVE  PROCALCITONIN    EKG EKG Interpretation  Date/Time:  Friday May 25 2022 15:07:58 EST Ventricular Rate:  104 PR Interval:    QRS Duration: 84 QT Interval:  351 QTC Calculation: 462 R Axis:   64 Text Interpretation: Atrial fibrillation Consider left ventricular hypertrophy Borderline T abnormalities, diffuse leads when compared to prior, similar appearance. No STEMI Confirmed by Antony Blackbird [phone]) on 06/02/2022 3:33:22 PM  Radiology CT HEAD WO CONTRAST (5MM)  Result Date: 05/18/2022 CLINICAL DATA:  Neuro deficit, acute, stroke suspected. Abnormal breathing pattern. Recent stroke. EXAM: CT HEAD WITHOUT CONTRAST TECHNIQUE: Contiguous axial images were obtained from the base of the skull through the vertex without intravenous contrast. RADIATION DOSE REDUCTION: This exam was performed according to the departmental dose-optimization program which includes automated exposure control, adjustment of the mA and/or kV according to patient size and/or use of iterative reconstruction technique. COMPARISON:  MRI 05/04/2022.  CT 05/03/2022.  FINDINGS: Brain: Generalized age related atrophy. No focal abnormality seen affecting the brainstem or cerebellum. No focal or acute supra tentorial stroke identified. Small acute infarctions seen in the right frontal opercular region in November are not specifically identified. No evidence of acute extension. No swelling, mass, hemorrhage, hydrocephalus or extra-axial collection. Vascular: There is atherosclerotic calcification of the major vessels at the base of the brain. Skull: Negative Sinuses/Orbits: Clear/normal Other: None IMPRESSION: 1. No acute CT finding. Generalized age related atrophy. Small acute infarctions seen in the right frontal opercular region in November by mri are not specifically identified by ct. No evidence of acute extension. 2. Atherosclerotic calcification of the major vessels at the base of the brain. Electronically Signed   By: Nelson Chimes M.D.   On: 06/09/2022 17:22   DG Chest Portable 1 View  Result Date: 05/29/2022 CLINICAL DATA:  Shortness of breath. EXAM: PORTABLE CHEST 1 VIEW COMPARISON:  May 03, 2022. FINDINGS: Stable cardiomegaly with mild central pulmonary vascular congestion. Mild bibasilar subsegmental atelectasis or edema is noted. Small pleural effusions may be present. Left-sided pacemaker is unchanged in position. Bony thorax is unremarkable. IMPRESSION: Stable cardiomegaly with mild central pulmonary vascular congestion. Mild bibasilar subsegmental atelectasis or edema is noted with small pleural effusions. Electronically Signed   By: Marijo Conception M.D.   On: 05/24/2022 15:57    Procedures Procedures    CRITICAL CARE Performed by: Gwenyth Allegra Nils Thor Total critical care time: 35 minutes Critical care time was exclusive of separately billable procedures and treating other patients. Critical care was necessary to treat or prevent imminent or life-threatening deterioration. Critical care was time spent personally by me on the following activities:  development of treatment plan with patient and/or surrogate as well as nursing, discussions with consultants, evaluation of patient's response to treatment, examination of patient, obtaining history from patient or surrogate, ordering and performing treatments and interventions, ordering and review of laboratory studies, ordering and review of radiographic studies, pulse oximetry and re-evaluation of patient's condition.   Medications Ordered in ED Medications  dextrose 5 % solution ( Intravenous Rate/Dose Change 06/02/2022 2022)  sodium chloride flush (NS) 0.9 % injection 3 mL (3 mLs Intravenous Not Given 06/03/2022 2115)  acetaminophen (TYLENOL) tablet 650 mg (has no administration in time range)    Or  acetaminophen (TYLENOL) suppository 650 mg (has no administration in time range)  ondansetron (ZOFRAN) tablet 4 mg (has no administration in time range)    Or  ondansetron (ZOFRAN) injection 4 mg (has no administration in time range)  levalbuterol (XOPENEX) nebulizer solution 0.63 mg (0.63 mg Nebulization Given 06/14/2022 2026)  ipratropium (ATROVENT) nebulizer solution 0.5 mg (0.5 mg Nebulization Given 05/28/2022 2026)  sodium chloride HYPERTONIC 3 % nebulizer solution 4 mL (4 mLs Nebulization Given 06/14/2022 2105)  levothyroxine (SYNTHROID, LEVOTHROID) injection 56.25 mcg (has no administration in time range)  metoprolol tartrate (LOPRESSOR) injection 5 mg (5 mg Intravenous Given 06/16/2022 2015)  Ampicillin-Sulbactam (UNASYN) 3 g in sodium chloride 0.9 % 100 mL IVPB (3 g Intravenous New Bag/Given 05/28/2022 2021)  methylPREDNISolone sodium succinate (SOLU-MEDROL) 40 mg/mL injection 40 mg (40 mg Intravenous Given 05/30/2022 2014)  enoxaparin (LOVENOX) injection 67.5 mg (67.5 mg Subcutaneous Given 05/24/2022 2104)  glycopyrrolate (ROBINUL) injection 0.1 mg (0.1 mg Intravenous Given 05/18/2022 1913)  sodium chloride 0.45 % bolus 1,000 mL (1,000 mLs Intravenous New Bag/Given 06/16/2022 2020)    ED Course/ Medical Decision  Making/ A&P                           Medical Decision Making Amount and/or Complexity of Data Reviewed Labs: ordered. Radiology: ordered.  Risk Decision regarding hospitalization.    Jerry Wood is a 88 y.o. male with a past medical history significant for Parkinson's disease, permanent atrial fibrillation on Eliquis therapy, hypertension, tachybradycardia syndrome with pacemaker, CAD status post PCI, CKD, recent stroke, and recent COVID diagnosis who presents for decreased oral intake, increased work of breathing, shortness of breath, tachycardia, new oxygen requirement and EMS report of possible hyponatremia.  Cording to EMS report from nursing, patient was recently diagnosed with COVID-19 over a week ago and just was cleared from restrictions.  He reportedly is nonverbal at baseline and is at mental status baseline but has had worsening breathing, shortness of breath, tachypnea, and tachycardia today.  Patient is DNR and has paperwork with him.  Patient currently has fluid taking and subcutaneous fluids going.  Patient is tachypneic in the 30s, tachycardic in the 120s, and oxygen saturations are now in the low 90s on 2 L.  He is reportedly not take oxygen at home.  Patient is nonverbal and unable to answer questions.  Patient arrived with documentation saying that he has had increase in the cough, respiratory symptoms, congestion, and they are concerned about pneumonia or worsened infection.  On my exam, patient does have some edema in extremities.  Lungs have rhonchi in all fields but he was not wheezing.  Chest and abdomen abdomen did not appear tender.  No evidence of acute trauma seen on initial skin survey.  Pupils symmetric and reactive.  EKG shows no STEMI.  Nursing reports that EMS said that patient was hypernatremic but is unclear what the values are.  Will get screening labs, look for new pneumonia given his reported congestion and cough and increased work of breathing with  tachycardia and reported hypoxia.  Anticipate reassessment after workup to determine disposition.  4:44 PM Critical care/pulmonology came to the bedside and evaluate breathing.  We discussed the possibility  of Cheyne-Stokes respirations given recent stroke however they thinks this seems more ataxic with his breathing.  They recommend getting a head CT which we will order but they do feel the patient is likely appropriate for a stepdown bed and not ICU level bed at this time.  They will leave a note giving recommendations.  They did not feel that BiPAP would help at this time.  We will wait for the labs to return and get the CT head and will call medicine for admission.  Will also try to engage palliative care to have their involvement         Final Clinical Impression(s) / ED Diagnoses Final diagnoses:  Abnormal breathing  Hypoxia  Hypernatremia     Clinical Impression: 1. Abnormal breathing   2. Hypoxia   3. Hypernatremia     Disposition: Admit  This note was prepared with assistance of Dragon voice recognition software. Occasional wrong-word or sound-a-like substitutions may have occurred due to the inherent limitations of voice recognition software.      Nyaisha Simao, Gwenyth Allegra, MD 06/07/2022 [phone]

## 2022-05-25 NOTE — Assessment & Plan Note (Addendum)
-  IV Lopressor. -Patient seen by palliative care and decision made to transition to full comfort measures.

## 2022-05-25 NOTE — H&P (Addendum)
History and Physical    Jerry Wood UEA:[id number] DOB: 1922-04-20 DOA: 05/29/2022  PCP: Johna Roles, PA Patient coming from: Nursing facility  I have personally briefly reviewed patient's old medical records in [place]  Chief Complaint: Hypernatremia/tachypnea/apnea  HPI: Jerry Wood is a 88 y.o. male with medical history significant of Parkinson's disease, tachybradycardia syndrome status post PPM, CAD status post PCI, CKD, wheelchair-bound, recent hospitalization for acute CVA 05/09/2022 with left facial weakness left-sided weakness, discharged to SNF.  Noted to have been diagnosed with COVID-19 8 days prior to admission 05/17/2022 per daughter is at bedside and treated with antibiotics for COVID-pneumonia per family.  It was noted that over the past 3 to 4 days patient's condition has deteriorated, patient with decreased appetite, decreased oral intake, noted to have worsening baseline from dysarthric speech per family, some bouts of confusion and noted at facility to have sodium of 151.  Family denies any recent fevers or chills.  Denies any chest pain.  Per family patient hard of hearing and did endorse some shortness of breath whereby stated needed to sit up to breathe better over the past few days.  Per family no significant abdominal pain, no constipation.  Had a bout of diarrhea about 2 to 3 days ago but none since then.  Noted to have had a bloody stool per daughter a few days ago however no ongoing bloody stools.  Patient noted on evaluation to have a Cheyne-Stokes respiration with bouts of significant apnea.  ED Course: Patient seen in the ED, noted to have some bouts of Cheyne-Stokes respiration.  Review of Systems: As per HPI otherwise all other systems reviewed and are negative.  CBC done with a white count of 12, hemoglobin of 12.2, platelet of 236, ANC of 10.6.  Patient also noted to have been on steroids recently.  BNP elevated at 771.4.  High-sensitivity  troponin of 68-70.  Comprehensive metabolic profile with a sodium of 151, chloride of 122, bicarb of 21, glucose of 112, calcium of 8.6, albumin of 2.7, protein of 6.2 otherwise within normal limits.  Lactic acid level noted at 1.9, 1.5.  Urinalysis not done.  CT head with no acute findings, generalized age-related atrophy, small acute infarction seen in the right frontal opercular region in November by MRI and not specifically identified by CT.  No evidence of acute extension.  Atherosclerotic calcification of the major vessels at the base of the brain.  Chest x-ray with stable cardiomegaly with mild central pulmonary vascular congestion.  Mild bibasilar subsegmental atelectasis or edema is noted with small pleural effusions.  EDP consulted with PCCM who assessed the patient and recommended medical admission to stepdown unit and also involvement of palliative care.  Past Medical History:  Diagnosis Date   Allergic rhinitis    Cancer ([place]) 09/2015   skin ,basal cell   Chronic knee pain    Constipation    COPD, mild (HCC)    Coronary artery disease    s/p PCI of the RCA   Diverticulosis    Dyslipidemia    Gout    Hypertension    Long term (current) use of anticoagulants    Onychomycosis    Osteoarthrosis, unspecified whether generalized or localized, other specified sites    Has gait disorder secondary to DJD of lower extremities   Parkinson's disease 03/2012   Dr Vilinda Blanks started   Permanent atrial fibrillation ([place])    Chronic. ECHO 06/04/11 LVEF estimated by 2D at 52.1%  Pure hypercholesterolemia    Seborrheic keratosis, inflamed    Skin lesion    Plan to remove left posterior ear lesion with electrocautery   Tachy-brady syndrome [facility])    s/p permanent pacemaker placement   Unspecified hypothyroidism    Vertigo    Vitamin D deficiency     Past Surgical History:  Procedure Laterality Date   BIV PACEMAKER GENERATOR CHANGEOUT N/A 03/13/2022   Procedure: BIV PACEMAKER  GENERATOR CHANGEOUT;  Surgeon: Vickie Epley, MD;  Location: [facility];  Service: Cardiovascular;  Laterality: N/A;   FRACTURE SURGERY Right    clavicle   INGUINAL HERNIA REPAIR Bilateral    LAPAROSCOPIC PARTIAL COLECTOMY     Partial resection of sigmoid colon   PACEMAKER PLACEMENT  2003   Tachybradycardia syndrome. Gen change 07/29/08, Dr. Leonia Reeves   TYMPANOPLASTY Bilateral     Social History  reports that he quit smoking about 73 years ago. His smoking use included cigarettes. He has been exposed to tobacco smoke. He has never used smokeless tobacco. He reports that he does not drink alcohol and does not use drugs.  No Known Allergies  Family History  Problem Relation Age of Onset   CAD Father    Diabetes Father    Bladder Cancer Other    Leukemia Other    Family history noncontributory.  Prior to Admission medications   Medication Sig Start Date End Date Taking? Authorizing Provider  acetaminophen (TYLENOL) 500 MG tablet Take 1 tablet (500 mg total) by mouth every 6 (six) hours. Patient taking differently: Take 1,000 mg by mouth 3 (three) times daily. 05/08/22  Yes Aline August, MD  Amino Acids-Protein Hydrolys (PRO-STAT MAX) LIQD Take 30 mLs by mouth at bedtime.   Yes [provider]  apixaban (ELIQUIS) 5 MG TABS tablet TAKE 1 TABLET(5 MG) BY MOUTH TWICE DAILY Patient taking differently: Take 5 mg by mouth 2 (two) times daily. 04/27/22  Yes Camnitz, Ocie Doyne, MD  ascorbic acid (VITAMIN C) 500 MG tablet Take 500 mg by mouth daily. 05/22/22 05/29/22 Yes [provider]  carbidopa-levodopa (SINEMET IR) 25-100 MG tablet TAKE 2 TABLETS BY MOUTH THREE TIMES DAILY Patient taking differently: Take 1 tablet by mouth 3 (three) times daily. 10/10/21  Yes Penumalli, [name], MD  dexamethasone (DECADRON) 1 MG tablet Take 1 mg by mouth See admin instructions. Take 1 mg by mouth once a day FOR VIRAL PNEUMONIA 05/22/22 05/30/22 Yes [provider]   ipratropium-albuterol (DUONEB) 0.5-2.5 (3) MG/3ML SOLN Take 3 mLs by nebulization 2 (two) times daily. 05/22/22 06/06/22 Yes [provider]  levothyroxine (SYNTHROID, LEVOTHROID) 75 MCG tablet Take 75 mcg by mouth daily before breakfast.  11/15/12  Yes [provider]  loratadine (CLARITIN) 10 MG tablet Take 10 mg by mouth daily.   Yes [provider]  midodrine (PROAMATINE) 5 MG tablet Take 1 tablet (5 mg total) by mouth 3 (three) times daily with meals. Patient taking differently: Take 5 mg by mouth See admin instructions. Take 5 mg by mouth three times a day with meals and HOLD FOR A SYSTOLIC GREATER THAN 097 05/08/22  Yes Aline August, MD  Multiple Vitamin (MULTIVITAMIN) tablet Take 1 tablet by mouth daily with lunch.   Yes [provider]  nystatin ointment (MYCOSTATIN) Apply 1 Application topically See admin instructions. Apply a thin layer to groin and peri area 2 times a day 05/23/22  Yes [provider]  pravastatin (PRAVACHOL) 40 MG tablet Take 1 tablet (40  mg total) by mouth every evening. Patient taking differently: Take 40 mg by mouth at bedtime. 05/08/22  Yes Aline August, MD  Probiotic Product (PROBIOTIC PO) Take 1 capsule by mouth 2 (two) times daily.   Yes [provider]  sennosides-docusate sodium (SENOKOT-S) 8.6-50 MG tablet Take 1 tablet by mouth daily as needed for constipation (for mild constipation).   Yes [provider]  TUMS 500 MG chewable tablet Chew 1 tablet by mouth daily as needed for indigestion or heartburn.   Yes [provider]  vitamin B-12 (CYANOCOBALAMIN) 1000 MCG tablet Take 1,000 mcg by mouth daily.   Yes [provider]  zinc gluconate 50 MG tablet Take 50 mg by mouth at bedtime. 05/22/22 05/29/22 Yes [provider]  ezetimibe (ZETIA) 10 MG tablet TAKE 1 TABLET(10 MG) BY MOUTH DAILY Patient not taking: Reported on 05/31/2022 02/13/22   Sueanne Margarita, MD    Physical  Exam: Vitals:   06/06/2022 1700 06/12/2022 1730 06/11/2022 1830 06/14/2022 1941  BP: 136/86 (!) 130/93 (!) 124/108   Pulse: (!) 109 74 (!) 104   Resp: (!) 33 (!) 35 (!) 0   Temp:      TempSrc:      SpO2: 95% 96% 93%   Weight:    66.5 kg    Constitutional: NAD, calm, comfortable Vitals:   06/14/2022 1700 05/27/2022 1730 05/28/2022 1830 05/30/2022 1941  BP: 136/86 (!) 130/93 (!) 124/108   Pulse: (!) 109 74 (!) 104   Resp: (!) 33 (!) 35 (!) 0   Temp:      TempSrc:      SpO2: 95% 96% 93%   Weight:    66.5 kg   Eyes: PERRL, lids and conjunctivae normal ENMT: Mucous membranes are extremely dry.  Posterior pharynx with no significant lesions noted.  Neck: normal, supple, no masses, no thyromegaly Respiratory: Patient with periods of apnea.  Rhonchorous breath sounds noted anterior lung fields.  No wheezing.  Some Cheyne-Stokes respirations noted.  Cardiovascular: Irregularly irregular.  No significant lower extremity edema noted.  Abdomen: Soft, mildly distended, nontender to palpation, positive bowel sounds.  Skin subcutaneous IV fluid noted in abdominal region.   Musculoskeletal: no clubbing / cyanosis. No joint deformity upper and lower extremities. Good ROM, no contractures. Normal muscle tone.  Skin: no rashes, lesions, ulcers. No induration Neurologic: Patient with some periods of apnea.  Patient hard of hearing per daughter's.  Able to briefly open eyes and ask his daughters a few questions but drifted back up to sleep.  Unable to assess full neurological exam due to mental status and respiratory issues.  Psychiatric: Unable to assess judgment and insight.  Unable to assess mood.   (Labs on Admission: I have personally reviewed following labs and imaging studies  CBC: Recent Labs  Lab 06/16/2022 1520  WBC 12.0*  NEUTROABS 10.6*  HGB 12.2*  HCT 40.0  MCV 103.6*  PLT 027    Basic Metabolic Panel: Recent Labs  Lab 06/06/2022 1600  NA 151*  K 4.8  CL 122*  CO2 21*  GLUCOSE 112*  BUN  23  CREATININE 1.21  CALCIUM 8.6*    GFR: Estimated Creatinine Clearance: 28 mL/min (by C-G formula based on SCr of 1.21 mg/dL).  Liver Function Tests: Recent Labs  Lab 06/11/2022 1600  AST 36  ALT 6  ALKPHOS 68  BILITOT 1.1  PROT 6.2*  ALBUMIN 2.7*    Urine analysis:    Component Value Date/Time   COLORURINE  YELLOW 05/03/2022 1725   APPEARANCEUR HAZY (A) 05/03/2022 1725   LABSPEC 1.009 05/03/2022 1725   PHURINE 6.0 05/03/2022 1725   GLUCOSEU NEGATIVE 05/03/2022 1725   HGBUR NEGATIVE 05/03/2022 1725   BILIRUBINUR NEGATIVE 05/03/2022 [place] 05/03/2022 1725   PROTEINUR NEGATIVE 05/03/2022 1725   NITRITE NEGATIVE 05/03/2022 [place] 05/03/2022 1725    Radiological Exams on Admission: CT HEAD WO CONTRAST (5MM)  Result Date: 05/24/2022 CLINICAL DATA:  Neuro deficit, acute, stroke suspected. Abnormal breathing pattern. Recent stroke. EXAM: CT HEAD WITHOUT CONTRAST TECHNIQUE: Contiguous axial images were obtained from the base of the skull through the vertex without intravenous contrast. RADIATION DOSE REDUCTION: This exam was performed according to the departmental dose-optimization program which includes automated exposure control, adjustment of the mA and/or kV according to patient size and/or use of iterative reconstruction technique. COMPARISON:  MRI 05/04/2022.  CT 05/03/2022. FINDINGS: Brain: Generalized age related atrophy. No focal abnormality seen affecting the brainstem or cerebellum. No focal or acute supra tentorial stroke identified. Small acute infarctions seen in the right frontal opercular region in November are not specifically identified. No evidence of acute extension. No swelling, mass, hemorrhage, hydrocephalus or extra-axial collection. Vascular: There is atherosclerotic calcification of the major vessels at the base of the brain. Skull: Negative Sinuses/Orbits: Clear/normal Other: None IMPRESSION: 1. No acute CT finding.  Generalized age related atrophy. Small acute infarctions seen in the right frontal opercular region in November by mri are not specifically identified by ct. No evidence of acute extension. 2. Atherosclerotic calcification of the major vessels at the base of the brain. Electronically Signed   By: Nelson Chimes M.D.   On: 06/12/2022 17:22   DG Chest Portable 1 View  Result Date: 06/14/2022 CLINICAL DATA:  Shortness of breath. EXAM: PORTABLE CHEST 1 VIEW COMPARISON:  May 03, 2022. FINDINGS: Stable cardiomegaly with mild central pulmonary vascular congestion. Mild bibasilar subsegmental atelectasis or edema is noted. Small pleural effusions may be present. Left-sided pacemaker is unchanged in position. Bony thorax is unremarkable. IMPRESSION: Stable cardiomegaly with mild central pulmonary vascular congestion. Mild bibasilar subsegmental atelectasis or edema is noted with small pleural effusions. Electronically Signed   By: Marijo Conception M.D.   On: 05/30/2022 15:57    EKG: Independently reviewed.  Atrial fibrillation  Assessment and Plan: * Acute respiratory failure with hypoxia ([place]) -Patient presented with acute respiratory failure with hypoxia, requiring O2, noted on initial presentation to the ED to be tachycardic, tachypneic with respiratory rates as high as 35, noted to be in A-fib heart rate as high as 170s. -Patient with some Cheyne-Stokes respiration noted. -??  Etiology.  Concern for also possible aspiration pneumonia with recent history of CVA, in the setting of recent COVID-19 infection. -Head CT done with no acute infarct noted, patient noted to have recent CVA. -Concern for possible CHF as BNP elevated, chest x-ray with some cardiomegaly. -Patient however clinically dry on examination. -Unable to tell if any acute infiltrate.  Patient with a leukocytosis however recently been on steroids. -Patient seen by PCCM who feel NT suction may be uncomfortable for the patient at this time and  holding off. -VBG done with a pH of 7.41, pCO2 of 36, pO2 of 63, bicarb of 29. -Likely not a candidate for BiPAP at this time as concern patient unable to handle his own secretions. -Patient clinically dry on examination, elevated sodium level at 151, recent poor oral intake in the setting of recent COVID-19  PCR pneumonia 8 days prior to admission. -Check a respiratory viral panel, CRP, procalcitonin, D-dimer. -Check a 2D echo. -Robinul IV x 1. -IV Solu-Medrol. -Monitor closely in the stepdown unit. -Placed empirically on IV Unasyn due to concerns for possible aspiration pneumonia, SLP evaluation. -If patient deteriorates or worsens may need to transition to comfort measures, discussed with family. -Palliative care consultation pending. -Gentle hydration for the next 24 hours. -Supportive care.  History of COVID-19: Recent infection diagnosed 05/17/2022 per family -Per daughter patient recently diagnosed with COVID-19 infection 05/17/2022 and treated for pneumonia with antibiotics at that time.  Patient noted to also have been on steroids as well. -Checking CRP, procalcitonin, follow BNP and trend, D-dimer. -Place on IV Solu-Medrol.  Hypernatremia -Likely secondary to hypovolemic hypernatremia. -Patient recently hospitalized for acute CVA and discharged on 05/09/2022. -Patient also noted to be recently diagnosed with COVID-19 per daughter on 05/17/2022 and treated for pneumonia with antibiotics. -Patient noted with poor oral intake. -Half-normal saline 1 L bolus x 1.   -Place on D5W 110 cc an hour for the next 24 hours, repeat labs in the AM. -Monitor closely for volume overload.  Dehydration -Half-normal saline 1 L bolus x 1.   -D5W at 110 cc an hour to rehydrate.. -Monitor closely for volume overload.  Cheyne-Stokes respiration -??  Etiology -Head CT negative for any acute CVA. -See acute respiratory failure with hypoxia above.  Acute CVA (cerebrovascular accident)  ([place]) -Noted to have history of recent CVA in the setting of A-fib felt likely embolic in nature. -Patient discharged on Eliquis. -Hold Eliquis due to current respiratory issues and placed on full dose Lovenox. -Hold statin, Zetia. -N.p.o. until reevaluation by speech therapy.  Tachycardia-bradycardia ([place]) -See chronic A-fib above.  HTN (hypertension) -IV Lopressor.  Pure hypercholesterolemia -Hold Zetia.  Coronary atherosclerosis of native coronary artery -IV Lopressor. -Hold zetia until mental status improves and tolerating oral intake.  Chronic atrial fibrillation (HCC) -Patient with history of tachybradycardia syndrome status post PPM. -Place on Lopressor 5 mg IV every 8 hours. -Check a 2D echo due to problem #1 with acute respiratory failure with hypoxia. -Hold Eliquis and placed on full dose Lovenox for now.  Parkinson disease -Hold home regimen of medications now until patient is more alert and tolerating oral intake.         DVT prophylaxis: Lovenox Code Status:   DNR Family Communication:  Updated daughters at bedside Disposition Plan:   Patient is from:  Nursing facility  Anticipated DC to:  TBD  Anticipated DC date:  TBD  Anticipated DC barriers: Clinical deterioration  Consults called:  PCCM Admission status:  Admit to inpatient/stepdown unit  Severity of Illness: The appropriate patient status for this patient is INPATIENT. Inpatient status is judged to be reasonable and necessary in order to provide the required intensity of service to ensure the patient's safety. The patient's presenting symptoms, physical exam findings, and initial radiographic and laboratory data in the context of their chronic comorbidities is felt to place them at high risk for further clinical deterioration. Furthermore, it is not anticipated that the patient will be medically stable for discharge from the hospital within 2 midnights of admission.   * I certify that at the point  of admission it is my clinical judgment that the patient will require inpatient hospital care spanning beyond 2 midnights from the point of admission due to high intensity of service, high risk for further deterioration and high frequency of surveillance required.Quillian Quince  Grandville Silos MD [facility]  How to contact the [facility] Attending or Consulting provider [place] or covering provider during after hours [place], for this patient?   Check the care team in [facility] and look for a) attending/consulting [facility] provider listed and b) the [facility] team listed Log into [email or web address] and use [place]'s universal password to access. If you do not have the password, please contact the hospital operator. Locate the [facility] provider you are looking for under [facility] and page to a number that you can be directly reached. If you still have difficulty reaching the provider, please page the [facility] (Director on Call) for the Hospitalists listed on amion for assistance.  05/29/2022, 7:57 PM

## 2022-05-25 NOTE — Assessment & Plan Note (Addendum)
-  Half-normal saline 1 L bolus x 1 given on admission. -Was on IV fluids of D5W at 75 cc an hour which have been discontinued.   -Patient seen by palliative care decision made to transition to full comfort measures.

## 2022-05-25 NOTE — ED Notes (Signed)
Unable to get 2nd set of cultures

## 2022-05-25 NOTE — Assessment & Plan Note (Addendum)
-  Patient presented with acute respiratory failure with hypoxia, requiring O2, noted on initial presentation to the ED to be tachycardic, tachypneic with respiratory rates as high as 35, noted to be in A-fib heart rate as high as 170s. -Patient with some Cheyne-Stokes respiration noted. -??  Etiology.  Concern for also possible aspiration pneumonia with recent history of CVA, in the setting of recent COVID-19 infection. -Head CT done with no acute infarct noted, patient noted to have recent CVA. -Concern for possible CHF as BNP elevated, chest x-ray with some cardiomegaly. -Patient however clinically dry on examination. -Unable to tell if any acute infiltrate.  Patient with a leukocytosis however recently been on steroids. -Patient seen by PCCM who feel NT suction may be uncomfortable for the patient at this time and holding off. -VBG done with a pH of 7.41, pCO2 of 36, pO2 of 63, bicarb of 29. -Likely not a candidate for BiPAP at this time as concern patient unable to handle his own secretions. -Patient clinically dry on examination, elevated sodium level at 151, recent poor oral intake in the setting of recent COVID-19 PCR pneumonia 8 days prior to admission. -Check a respiratory viral panel, CRP, procalcitonin, D-dimer. -Check a 2D echo. -Robinul IV x 1. -IV Solu-Medrol. -Monitor closely in the stepdown unit. -Placed empirically on IV Unasyn due to concerns for possible aspiration pneumonia, SLP evaluation. -If patient deteriorates or worsens may need to transition to comfort measures, discussed with family. -Palliative care consultation pending. -Gentle hydration for the next 24 hours. -Supportive care.

## 2022-05-26 ENCOUNTER — Inpatient Hospital Stay (HOSPITAL_COMMUNITY): Payer: Medicare Other

## 2022-05-26 DIAGNOSIS — R52 Pain, unspecified: Secondary | ICD-10-CM

## 2022-05-26 DIAGNOSIS — E870 Hyperosmolality and hypernatremia: Secondary | ICD-10-CM | POA: Diagnosis not present

## 2022-05-26 DIAGNOSIS — R7989 Other specified abnormal findings of blood chemistry: Secondary | ICD-10-CM

## 2022-05-26 DIAGNOSIS — Z7189 Other specified counseling: Secondary | ICD-10-CM

## 2022-05-26 DIAGNOSIS — J9601 Acute respiratory failure with hypoxia: Secondary | ICD-10-CM | POA: Diagnosis not present

## 2022-05-26 DIAGNOSIS — Z79899 Other long term (current) drug therapy: Secondary | ICD-10-CM

## 2022-05-26 DIAGNOSIS — I639 Cerebral infarction, unspecified: Secondary | ICD-10-CM | POA: Diagnosis not present

## 2022-05-26 DIAGNOSIS — R0600 Dyspnea, unspecified: Secondary | ICD-10-CM

## 2022-05-26 DIAGNOSIS — I482 Chronic atrial fibrillation, unspecified: Secondary | ICD-10-CM

## 2022-05-26 DIAGNOSIS — I340 Nonrheumatic mitral (valve) insufficiency: Secondary | ICD-10-CM

## 2022-05-26 DIAGNOSIS — B348 Other viral infections of unspecified site: Secondary | ICD-10-CM

## 2022-05-26 DIAGNOSIS — U071 COVID-19: Secondary | ICD-10-CM

## 2022-05-26 DIAGNOSIS — R063 Periodic breathing: Secondary | ICD-10-CM | POA: Diagnosis not present

## 2022-05-26 DIAGNOSIS — G20A1 Parkinson's disease without dyskinesia, without mention of fluctuations: Secondary | ICD-10-CM | POA: Diagnosis not present

## 2022-05-26 DIAGNOSIS — I10 Essential (primary) hypertension: Secondary | ICD-10-CM

## 2022-05-26 DIAGNOSIS — Z515 Encounter for palliative care: Secondary | ICD-10-CM

## 2022-05-26 DIAGNOSIS — R451 Restlessness and agitation: Secondary | ICD-10-CM

## 2022-05-26 LAB — ECHOCARDIOGRAM COMPLETE
Area-P 1/2: 4.06 cm2
Calc EF: 54.5 %
Height: 61 in
S' Lateral: 2.2 cm
Single Plane A2C EF: 46.8 %
Single Plane A4C EF: 58.4 %
Weight: 2412.71 oz

## 2022-05-26 LAB — RESPIRATORY PANEL BY PCR

## 2022-05-26 LAB — CBC WITH DIFFERENTIAL/PLATELET
Abs Immature Granulocytes: 0.21 10*3/uL — ABNORMAL HIGH (ref 0.00–0.07)
Basophils Absolute: 0 10*3/uL (ref 0.0–0.1)
Basophils Relative: 0 %
Eosinophils Absolute: 0 10*3/uL (ref 0.0–0.5)
Eosinophils Relative: 0 %
HCT: 39.2 % (ref 39.0–52.0)
Hemoglobin: 11.7 g/dL — ABNORMAL LOW (ref 13.0–17.0)
Immature Granulocytes: 2 %
Lymphocytes Relative: 2 %
Lymphs Abs: 0.2 10*3/uL — ABNORMAL LOW (ref 0.7–4.0)
MCH: 31.5 pg (ref 26.0–34.0)
MCHC: 29.8 g/dL — ABNORMAL LOW (ref 30.0–36.0)
MCV: 105.4 fL — ABNORMAL HIGH (ref 80.0–100.0)
Monocytes Absolute: 0.1 10*3/uL (ref 0.1–1.0)
Monocytes Relative: 1 %
Neutro Abs: 10.7 10*3/uL — ABNORMAL HIGH (ref 1.7–7.7)
Neutrophils Relative %: 95 %
Platelets: 216 10*3/uL (ref 150–400)
RBC: 3.72 MIL/uL — ABNORMAL LOW (ref 4.22–5.81)
RDW: 16 % — ABNORMAL HIGH (ref 11.5–15.5)
WBC: 11.2 10*3/uL — ABNORMAL HIGH (ref 4.0–10.5)
nRBC: 2.6 % — ABNORMAL HIGH (ref 0.0–0.2)

## 2022-05-26 LAB — MRSA NEXT GEN BY PCR, NASAL: MRSA by PCR Next Gen: NOT DETECTED

## 2022-05-26 LAB — COMPREHENSIVE METABOLIC PANEL
ALT: 11 U/L (ref 0–44)
AST: 25 U/L (ref 15–41)
Albumin: 2.7 g/dL — ABNORMAL LOW (ref 3.5–5.0)
Alkaline Phosphatase: 69 U/L (ref 38–126)
Anion gap: 10 (ref 5–15)
BUN: 24 mg/dL — ABNORMAL HIGH (ref 8–23)
CO2: 18 mmol/L — ABNORMAL LOW (ref 22–32)
Calcium: 8.4 mg/dL — ABNORMAL LOW (ref 8.9–10.3)
Chloride: 118 mmol/L — ABNORMAL HIGH (ref 98–111)
Creatinine, Ser: 1.06 mg/dL (ref 0.61–1.24)
GFR, Estimated: >60 mL/min (ref >60)
Glucose, Bld: 178 mg/dL — ABNORMAL HIGH (ref 70–99)
Potassium: 4.4 mmol/L (ref 3.5–5.1)
Sodium: 146 mmol/L — ABNORMAL HIGH (ref 135–145)
Total Bilirubin: 0.8 mg/dL (ref 0.3–1.2)
Total Protein: 6.1 g/dL — ABNORMAL LOW (ref 6.5–8.1)

## 2022-05-26 LAB — GLUCOSE, CAPILLARY: Glucose-Capillary: 149 mg/dL — ABNORMAL HIGH (ref 70–99)

## 2022-05-26 LAB — C-REACTIVE PROTEIN
CRP: 6.1 mg/dL — ABNORMAL HIGH (ref <1.0)
CRP: 6.3 mg/dL — ABNORMAL HIGH (ref <1.0)

## 2022-05-26 LAB — D-DIMER, QUANTITATIVE: D-Dimer, Quant: 0.55 ug/mL-FEU — ABNORMAL HIGH (ref 0.00–0.50)

## 2022-05-26 LAB — MAGNESIUM: Magnesium: 2.3 mg/dL (ref 1.7–2.4)

## 2022-05-26 LAB — BRAIN NATRIURETIC PEPTIDE: B Natriuretic Peptide: 810.5 pg/mL — ABNORMAL HIGH (ref 0.0–100.0)

## 2022-05-26 LAB — PROCALCITONIN: Procalcitonin: <0.1 ng/mL

## 2022-05-26 MED ORDER — LEVALBUTEROL HCL 0.63 MG/3ML IN NEBU: 0.6300 mg | INHALATION_SOLUTION | Freq: Four times a day (QID) | RESPIRATORY_TRACT | Status: DC | PRN

## 2022-05-26 MED ORDER — POLYVINYL ALCOHOL 1.4 % OP SOLN: 1.0000 [drp] | Freq: Four times a day (QID) | OPHTHALMIC | Status: DC | PRN

## 2022-05-26 MED ORDER — ORAL CARE MOUTH RINSE
15.0000 mL | OROMUCOSAL | Status: DC
  Administered 2022-05-26 – 2022-05-31 (×22): 15 mL via OROMUCOSAL

## 2022-05-26 MED ORDER — HYDROMORPHONE HCL 1 MG/ML IJ SOLN
0.3000 mg | INTRAMUSCULAR | Status: DC | PRN
  Administered 2022-05-26 (×3): 0.3 mg via INTRAVENOUS
  Filled 2022-05-26 (×3): qty 1

## 2022-05-26 MED ORDER — LIP MEDEX EX OINT
TOPICAL_OINTMENT | CUTANEOUS | Status: DC | PRN
  Filled 2022-05-26: qty 7

## 2022-05-26 MED ORDER — LEVALBUTEROL HCL 0.63 MG/3ML IN NEBU
0.6300 mg | INHALATION_SOLUTION | Freq: Two times a day (BID) | RESPIRATORY_TRACT | Status: DC
  Administered 2022-05-26: 0.63 mg via RESPIRATORY_TRACT
  Filled 2022-05-26 (×2): qty 3

## 2022-05-26 MED ORDER — FUROSEMIDE 10 MG/ML IJ SOLN
20.0000 mg | Freq: Two times a day (BID) | INTRAMUSCULAR | Status: AC
  Administered 2022-05-27 – 2022-05-28 (×2): 20 mg via INTRAVENOUS
  Filled 2022-05-26 (×2): qty 2

## 2022-05-26 MED ORDER — LORAZEPAM 2 MG/ML IJ SOLN: 0.5000 mg | INTRAMUSCULAR | Status: DC | PRN

## 2022-05-26 MED ORDER — GLYCOPYRROLATE 0.2 MG/ML IJ SOLN
0.2000 mg | INTRAMUSCULAR | Status: DC | PRN
  Administered 2022-05-27 – 2022-05-29 (×3): 0.2 mg via INTRAVENOUS
  Filled 2022-05-26 (×3): qty 1

## 2022-05-26 MED ORDER — MORPHINE 100MG IN NS 100ML (1MG/ML) PREMIX INFUSION: 1.0000 mg/h | Freq: Once | INTRAVENOUS | Status: DC

## 2022-05-26 MED ORDER — FUROSEMIDE 10 MG/ML IJ SOLN
20.0000 mg | Freq: Two times a day (BID) | INTRAMUSCULAR | Status: DC
  Administered 2022-05-26: 20 mg via INTRAVENOUS
  Filled 2022-05-26: qty 2

## 2022-05-26 MED ORDER — BIOTENE DRY MOUTH MT LIQD: 15.0000 mL | OROMUCOSAL | Status: DC | PRN

## 2022-05-26 MED ORDER — MORPHINE SULFATE (PF) 2 MG/ML IV SOLN
1.0000 mg | Freq: Once | INTRAVENOUS | Status: AC
  Administered 2022-05-26: 1 mg via INTRAVENOUS
  Filled 2022-05-26: qty 1

## 2022-05-26 MED ORDER — SODIUM CHLORIDE 0.9 % IV SOLN
3.0000 g | Freq: Two times a day (BID) | INTRAVENOUS | Status: AC
  Administered 2022-05-26 – 2022-05-27 (×4): 3 g via INTRAVENOUS
  Filled 2022-05-26 (×3): qty 8

## 2022-05-26 MED ORDER — HALOPERIDOL LACTATE 5 MG/ML IJ SOLN: 0.5000 mg | INTRAMUSCULAR | Status: DC | PRN

## 2022-05-26 MED ORDER — ORAL CARE MOUTH RINSE: 15.0000 mL | OROMUCOSAL | Status: DC | PRN

## 2022-05-26 NOTE — Progress Notes (Signed)
  Echocardiogram 2D Echocardiogram has been performed.  Jerry Wood 05/26/2022, 9:05 AM

## 2022-05-26 NOTE — Progress Notes (Signed)
PROGRESS NOTE    Jerry Wood  VQQ:[id number] DOB: 18-Aug-1921 DOA: 05/23/2022 PCP: Johna Roles, PA    Chief Complaint  Patient presents with   Shortness of Breath    Brief Narrative:  Jerry Wood is a 88 y.o. male with medical history significant of Parkinson's disease, tachybradycardia syndrome status post PPM, CAD status post PCI, CKD, wheelchair-bound, recent hospitalization for acute CVA 05/09/2022 with left facial weakness left-sided weakness, discharged to SNF.  Noted to have been diagnosed with COVID-19 8 days prior to admission 05/17/2022 per daughter is at bedside and treated with antibiotics for COVID-pneumonia per family.  It was noted that over the past 3 to 4 days patient's condition has deteriorated, patient with decreased appetite, decreased oral intake, noted to have worsening baseline from dysarthric speech per family, some bouts of confusion and noted at facility to have sodium of 151.  Family denies any recent fevers or chills.  Denies any chest pain.  Per family patient hard of hearing and did endorse some shortness of breath whereby stated needed to sit up to breathe better over the past few days.  Per family no significant abdominal pain, no constipation.  Had a bout of diarrhea about 2 to 3 days ago but none since then.  Noted to have had a bloody stool per daughter a few days ago however no ongoing bloody stools.  Patient noted on evaluation to have a Cheyne-Stokes respiration with bouts of significant apnea.   ED Course: Patient seen in the ED, noted to have some bouts of Cheyne-Stokes respiration.      Assessment & Plan:  Principal Problem:   Acute respiratory failure with hypoxia (HCC) Active Problems:   Parkinson disease   Chronic atrial fibrillation (HCC)   Coronary atherosclerosis of native coronary artery   Pure hypercholesterolemia   HTN (hypertension)   Tachycardia-bradycardia (HCC)   Presence of permanent cardiac pacemaker   Acute CVA  (cerebrovascular accident) ([place])   Hypoxia   Cheyne-Stokes respiration   Dehydration   Hypernatremia   History of COVID-19: Recent infection diagnosed 05/17/2022 per family   Abnormal breathing   Infection due to human metapneumovirus (hMPV)   COVID-19 with multiple comorbidities   Dyspnea   Pain   Agitation   High risk medication use   Goals of care, counseling/discussion   Palliative care encounter    Assessment and Plan: * Acute respiratory failure with hypoxia ([place]) -Patient presented with acute respiratory failure with hypoxia, requiring O2, noted on initial presentation to the ED to be tachycardic, tachypneic with respiratory rates as high as 35, noted to be in A-fib heart rate as high as 170s. -Patient with some Cheyne-Stokes respiration noted. -??  Etiology.  Concern for also possible aspiration pneumonia with recent history of CVA, in the setting of recent COVID-19 infection.  Concern for volume overload. -Head CT done with no acute infarct noted, patient noted to have recent CVA. -Concern for CHF as BNP elevated, chest x-ray with some cardiomegaly. -Patient however clinically dry on examination. -Unable to tell if any acute infiltrate.  Patient with a leukocytosis however recently been on steroids. -Patient seen by PCCM who feel NT suction may be uncomfortable for the patient at this time and holding off. -VBG done with a pH of 7.41, pCO2 of 36, pO2 of 63, bicarb of 29. -Likely not a candidate for BiPAP at this time as concern patient unable to handle his own secretions. -Patient clinically dry on examination, elevated sodium level at  151 on admission with D5W down to 146., recent poor oral intake in the setting of recent COVID-19 PCR pneumonia 8 days prior to admission. -Respiratory viral panel positive for metapneumovirus.  Troponin elevated by flattened. -Procalcitonin negative. -CRP 6.1>>> 6.3. -Chest x-ray done this morning with worsening CHF with bilateral  effusions. -2D echo done with EF of 55 to 60%, NWMA, severely dilated left atrial size and severely dilated right atrial size.  Moderate to severe MVR that appears to have progressed significantly compared to prior 2D echo.  -Status post Robinul IV x 1 with some improvement with secretions on exam. -Continue IV Solu-Medrol. -Continue IV Unasyn. -SLP pending. -Patient with a poor prognosis, palliative care consultation pending. -Continue IV Lasix. -Supportive care.  Infection due to human metapneumovirus (hMPV) -Noted on respiratory viral panel. -Supportive care.  History of COVID-19: Recent infection diagnosed 05/17/2022 per family -Per daughter patient recently diagnosed with COVID-19 infection 05/17/2022 and treated for pneumonia with antibiotics at that time.  Patient noted to also have been on steroids as well. -Continue IV Solu-Medrol.   Hypernatremia -Likely secondary to hypovolemic hypernatremia. -Patient recently hospitalized for acute CVA and discharged on 05/09/2022. -Patient also noted to be recently diagnosed with COVID-19 per daughter on 05/17/2022 and treated for pneumonia with antibiotics. -Patient noted with poor oral intake. -Status post half-normal saline 1 L bolus x 1.   -Patient also given D5W and will decrease dose to 75 cc an hour due to concerns for volume overload. -Sodium level down to 146.  Dehydration -Half-normal saline 1 L bolus x 1 given on admission. -Decrease D5W to 75 cc an hour. -On IV Lasix this morning due to volume overload..  Cheyne-Stokes respiration -??  Etiology -Head CT negative for any acute CVA. -See acute respiratory failure with hypoxia above.  Acute CVA (cerebrovascular accident) ([place]) -Noted to have history of recent CVA in the setting of A-fib felt likely embolic in nature. -Patient discharged on Eliquis. -Hold Eliquis due to current respiratory issues and continue full dose Lovenox. -Hold statin, Zetia. -N.p.o. until  reevaluation by speech therapy.  Tachycardia-bradycardia ([place]) -See chronic A-fib above.  HTN (hypertension) -IV Lopressor.  Pure hypercholesterolemia -Hold Zetia.  Coronary atherosclerosis of native coronary artery -IV Lopressor. -Hold zetia until mental status improves and tolerating oral intake.  Chronic atrial fibrillation (HCC) -Patient with history of tachybradycardia syndrome status post PPM. -Continue IV Lopressor every 8 hours.   -Place on Lopressor 5 mg IV every 8 hours. -2D echo done with EF of 55 to 60%, NWMA, severely dilated left atrial size and severely dilated right atrial size.  Moderate to severe MVR that appears to have progressed significantly compared to prior 2D echo . -Continue to hold Eliquis and continue full dose Lovenox for now.  Parkinson disease -Continue to hold home regimen of medications, until patient is more alert and tolerating oral intake.         DVT prophylaxis: Lovenox Code Status: DNR Family Communication: No family at bedside. Disposition: Pending palliative care evaluation.  Status is: Inpatient Remains inpatient appropriate because: Severity of illness   Consultants:  Palliative care pending  Procedures:  CT head 05/27/2022 Chest x-ray 06/11/2022, 05/26/2022 2D echo 05/26/2022  Antimicrobials:  IV Unasyn 05/26/2022>>>.   Subjective: Patient on 3 L nasal cannula.  Still with some complaints of shortness of breath.  Gurgling rhonchorous breath sounds improved.  Denies any chest pain.  No abdominal pain.  Objective: Vitals:   05/26/22 0852 05/26/22 0900 05/26/22 1000 05/26/22 1100  BP:  (!) 120/91 121/81 128/86  Pulse: (!) 106 (!) 106 95 79  Resp: 14 (!) 33 (!) 28 (!) 29  Temp:      TempSrc:      SpO2: 94% 98% 99% 96%  Weight:      Height:        Intake/Output Summary (Last 24 hours) at 05/26/2022 1450 Last data filed at 05/26/2022 1200 Gross per 24 hour  Intake 1676.49 ml  Output 1250 ml  Net 426.49 ml   Filed  Weights   05/31/2022 1941 05/26/22 0400  Weight: 66.5 kg 68.4 kg    Examination:  General exam: Appears calm and comfortable  Respiratory system: Decreased rhonchorous breath sounds anterior lung fields.  Some bibasilar crackles.  Decreased breath sounds in the bases.  Cardiovascular system: Irregularly irregular.  Unable to assess JVD.  3/6 SEM.  No lower extremity edema.   Gastrointestinal system: Abdomen is nondistended, soft and nontender. No organomegaly or masses felt. Normal bowel sounds heard. Central nervous system: Opens eyes to verbal stimuli.  Moving extremities spontaneously.   Extremities: Symmetric 5 x 5 power. Skin: No rashes, lesions or ulcers Psychiatry: Judgement and insight unable to assess.  Mood and affect unable to assess.     Data Reviewed:   CBC: Recent Labs  Lab 06/15/2022 1520 05/26/22 0249  WBC 12.0* 11.2*  NEUTROABS 10.6* 10.7*  HGB 12.2* 11.7*  HCT 40.0 39.2  MCV 103.6* 105.4*  PLT 236 811    Basic Metabolic Panel: Recent Labs  Lab 05/19/2022 1600 05/26/22 0249  NA 151* 146*  K 4.8 4.4  CL 122* 118*  CO2 21* 18*  GLUCOSE 112* 178*  BUN 23 24*  CREATININE 1.21 1.06  CALCIUM 8.6* 8.4*  MG  --  2.3    GFR: Estimated Creatinine Clearance: 32.3 mL/min (by C-G formula based on SCr of 1.06 mg/dL).  Liver Function Tests: Recent Labs  Lab 06/12/2022 1600 05/26/22 0249  AST 36 25  ALT 6 11  ALKPHOS 68 69  BILITOT 1.1 0.8  PROT 6.2* 6.1*  ALBUMIN 2.7* 2.7*    CBG: Recent Labs  Lab 05/26/22 0748  GLUCAP 149*     Recent Results (from the past 240 hour(s))  Blood culture (routine x 2)     Status: None (Preliminary result)   Collection Time: 06/11/2022  3:20 PM   Specimen: BLOOD RIGHT HAND  Result Value Ref Range Status   Specimen Description   Final    BLOOD RIGHT HAND Performed at [facility], [place] [address], [place], [place] [address]    Special Requests   Final    BOTTLES DRAWN AEROBIC AND ANAEROBIC Blood Culture  results may not be optimal due to an inadequate volume of blood received in culture bottles Performed at [place] [address] [place], [place] [address]    Culture   Final    NO GROWTH < 24 HOURS Performed at [place] [address], [place], [place] [address]    Report Status PENDING  Incomplete  Respiratory (~20 pathogens) panel by PCR     Status: Abnormal   Collection Time: 06/07/2022  8:04 PM   Specimen: Nasopharyngeal Swab; Respiratory  Result Value Ref Range Status   Adenovirus NOT DETECTED NOT DETECTED Final   Coronavirus 229E NOT DETECTED NOT DETECTED Final    Comment: (NOTE) The Coronavirus on the Respiratory Panel, DOES NOT test for the novel  Coronavirus (2019 nCoV)  Coronavirus HKU1 NOT DETECTED NOT DETECTED Final   Coronavirus NL63 NOT DETECTED NOT DETECTED Final   Coronavirus OC43 NOT DETECTED NOT DETECTED Final   Metapneumovirus DETECTED (A) NOT DETECTED Final   Rhinovirus / Enterovirus NOT DETECTED NOT DETECTED Final   Influenza A NOT DETECTED NOT DETECTED Final   Influenza B NOT DETECTED NOT DETECTED Final   Parainfluenza Virus 1 NOT DETECTED NOT DETECTED Final   Parainfluenza Virus 2 NOT DETECTED NOT DETECTED Final   Parainfluenza Virus 3 NOT DETECTED NOT DETECTED Final   Parainfluenza Virus 4 NOT DETECTED NOT DETECTED Final   Respiratory Syncytial Virus NOT DETECTED NOT DETECTED Final   Bordetella pertussis NOT DETECTED NOT DETECTED Final   Bordetella Parapertussis NOT DETECTED NOT DETECTED Final   Chlamydophila pneumoniae NOT DETECTED NOT DETECTED Final   Mycoplasma pneumoniae NOT DETECTED NOT DETECTED Final    Comment: Performed at [facility], [place] [address], [place], [place] [address]  MRSA Next Gen by PCR, Nasal     Status: None   Collection Time: 06/06/2022 11:34 PM   Specimen: Nasal Mucosa; Nasal Swab  Result Value Ref Range Status   MRSA by PCR Next Gen NOT DETECTED NOT DETECTED Final    Comment:  (NOTE) The GeneXpert MRSA Assay (FDA approved for NASAL specimens only), is one component of a comprehensive MRSA colonization surveillance program. It is not intended to diagnose MRSA infection nor to guide or monitor treatment for MRSA infections. Test performance is not FDA approved in patients less than 62 years old. Performed at [facility], [place] [address], [place], [place] [address]   Blood culture (routine x 2)     Status: None (Preliminary result)   Collection Time: 05/26/22  2:51 AM   Specimen: BLOOD  Result Value Ref Range Status   Specimen Description   Final    BLOOD BLOOD RIGHT HAND IN PEDIATRIC BOTTLE Performed at [facility] [address] [place], [place] [address]    Special Requests   Final    NONE Performed at [facility], [place] [address], [place], [place] [address]    Culture   Final    NO GROWTH < 12 HOURS Performed at [place] [facility] [address] [place], [place] [address]    Report Status PENDING  Incomplete         Radiology Studies: ECHOCARDIOGRAM COMPLETE  Result Date: 05/26/2022    ECHOCARDIOGRAM REPORT   Patient Name:   WYNDELL CARDIFF Date of Exam: 05/26/2022 Medical Rec #:  [id number]      Height:       61.0 in Accession #:    [id number]     Weight:       150.8 lb Date of Birth:  12-25-1921      BSA:          1.675 m Patient Age:    80 years       BP:           123/99 mmHg Patient Gender: M              HR:           83 bpm. Exam Location:  Inpatient Procedure: 2D Echo, Cardiac Doppler and Color Doppler Indications:    Elevated troponins  History:        Patient has prior history of Echocardiogram examinations, most  recent 05/04/2022. CAD, Pacemaker, Stroke and COPD,                 Arrythmias:Tachycardia, Bradycardia and Atrial Fibrillation;                 Risk Factors:Hypertension and Dyslipidemia. CKD.  Sonographer:    Eartha Inch Referring Phys: [id number] Yazzie V  Arabia Nylund  Sonographer Comments: Technically difficult study due to poor echo windows. Image acquisition challenging due to patient body habitus, Image acquisition challenging due to respiratory motion and Image acquisition challenging due to COPD. IMPRESSIONS  1. Left ventricular ejection fraction, by estimation, is 55 to 60%. The left ventricle has normal function. The left ventricle has no regional wall motion abnormalities. Diastolic function indeterminant due to Afib.  2. Right ventricular systolic function is mildly reduced. The right ventricular size is mildly-to-moderately enlarged. There is moderately elevated pulmonary artery systolic pressure. The estimated right ventricular systolic pressure is 86.7 mmHg.  3. Left atrial size was severely dilated.  4. Right atrial size was severely dilated.  5. The mitral valve is degenerative. There is moderate to severe mitral regurgitation that appears to have progressed significantly compared to prior TTE. Could consider TEE if clinically indicated.  6. Tricuspid valve regurgitation is moderate.  7. The aortic valve is tricuspid. There is moderate calcification of the aortic valve. There is moderate thickening of the aortic valve. Aortic valve regurgitation is trivial. Possible mild AS although suboptimal doppler interrogation. Was noted on prior TTE.  8. The inferior vena cava is normal in size with greater than 50% respiratory variability, suggesting right atrial pressure of 3 mmHg. Comparison(s): Prior images reviewed side by side. Compared to prior TTE 05/04/2022, there is now moderate to severe MR (previously mild) and moderate TR (previously mild). FINDINGS  Left Ventricle: Left ventricular ejection fraction, by estimation, is 55 to 60%. The left ventricle has normal function. The left ventricle has no regional wall motion abnormalities. The left ventricular internal cavity size was normal in size. There is  no left ventricular hypertrophy. Diastolic function  indeterminant due to Afib. Right Ventricle: The right ventricular size is mildly-to-moderately enlarged. No increase in right ventricular wall thickness. Right ventricular systolic function is mildly reduced. There is moderately elevated pulmonary artery systolic pressure. The tricuspid regurgitant velocity is 3.34 m/s, and with an assumed right atrial pressure of 3 mmHg, the estimated right ventricular systolic pressure is 67.2 mmHg. Left Atrium: Left atrial size was severely dilated. Right Atrium: Right atrial size was severely dilated. Pericardium: Trivial pericardial effusion is present. The pericardial effusion is anterior to the right ventricle. Mitral Valve: The mitral valve is degenerative in appearance. There is mild thickening of the mitral valve leaflet(s). There is mild calcification of the mitral valve leaflet(s). Moderate to severe mitral valve regurgitation. Tricuspid Valve: The tricuspid valve is normal in structure. Tricuspid valve regurgitation is moderate. Aortic Valve: The aortic valve is tricuspid. There is moderate calcification of the aortic valve. There is moderate thickening of the aortic valve. Aortic valve regurgitation is trivial. Possible mild AS although suboptimal doppler interrogation. Was noted on prior TTE. Pulmonic Valve: The pulmonic valve was grossly normal. Pulmonic valve regurgitation is trivial. Aorta: The aortic root is normal in size and structure. Venous: The inferior vena cava is normal in size with greater than 50% respiratory variability, suggesting right atrial pressure of 3 mmHg. IAS/Shunts: The atrial septum is grossly normal. Additional Comments: A device lead is visualized.  LEFT VENTRICLE PLAX 2D LVIDd:  3.40 cm     Diastology LVIDs:         2.20 cm     LV e' medial:    6.13 cm/s LV PW:         0.90 cm     LV E/e' medial:  13.6 LV IVS:        0.80 cm     LV e' lateral:   8.50 cm/s LVOT diam:     1.70 cm     LV E/e' lateral: 9.8 LV SV:         27 LV SV Index:    16 LVOT Area:     2.27 cm  LV Volumes (MOD) LV vol d, MOD A2C: 72.9 ml LV vol d, MOD A4C: 79.6 ml LV vol s, MOD A2C: 38.8 ml LV vol s, MOD A4C: 33.1 ml LV SV MOD A2C:     34.1 ml LV SV MOD A4C:     79.6 ml LV SV MOD BP:      43.0 ml RIGHT VENTRICLE            IVC RV S prime:     9.29 cm/s  IVC diam: 1.90 cm TAPSE (M-mode): 1.6 cm LEFT ATRIUM              Index         RIGHT ATRIUM           Index LA diam:        5.20 cm  3.10 cm/m    RA Area:     21.50 cm LA Vol (A2C):   149.0 ml 88.95 ml/m   RA Volume:   63.30 ml  37.79 ml/m LA Vol (A4C):   224.0 ml 133.72 ml/m LA Biplane Vol: 196.0 ml 117.00 ml/m  AORTIC VALVE LVOT Vmax:   67.90 cm/s LVOT Vmean:  48.700 cm/s LVOT VTI:    0.119 m  AORTA Ao Root diam: 2.90 cm MITRAL VALVE               TRICUSPID VALVE MV Area (PHT): 4.06 cm    TR Peak grad:   44.6 mmHg MV Decel Time: 187 msec    TR Vmax:        334.00 cm/s MV E velocity: 83.43 cm/s                            SHUNTS                            Systemic VTI:  0.12 m                            Systemic Diam: 1.70 cm Gwyndolyn Kaufman MD Electronically signed by Gwyndolyn Kaufman MD Signature Date/Time: 05/26/2022/11:22:31 AM    Final    Portable chest 1 View  Result Date: 05/26/2022 CLINICAL DATA:  Respiratory failure and hypoxia EXAM: PORTABLE CHEST 1 VIEW COMPARISON:  Film from the previous day. FINDINGS: Cardiac shadow is enlarged but stable. Pacing device is again seen. Increasing central vascular congestion is noted with increasing edema. Small effusions are again noted. No focal confluent infiltrate is seen. IMPRESSION: Worsening CHF with bilateral effusions. Electronically Signed   By: Inez Catalina M.D.   On: 05/26/2022 04:02   CT HEAD WO CONTRAST (5MM)  Result Date: 05/23/2022 CLINICAL DATA:  Neuro deficit, acute,  stroke suspected. Abnormal breathing pattern. Recent stroke. EXAM: CT HEAD WITHOUT CONTRAST TECHNIQUE: Contiguous axial images were obtained from the base of the skull through the  vertex without intravenous contrast. RADIATION DOSE REDUCTION: This exam was performed according to the departmental dose-optimization program which includes automated exposure control, adjustment of the mA and/or kV according to patient size and/or use of iterative reconstruction technique. COMPARISON:  MRI 05/04/2022.  CT 05/03/2022. FINDINGS: Brain: Generalized age related atrophy. No focal abnormality seen affecting the brainstem or cerebellum. No focal or acute supra tentorial stroke identified. Small acute infarctions seen in the right frontal opercular region in November are not specifically identified. No evidence of acute extension. No swelling, mass, hemorrhage, hydrocephalus or extra-axial collection. Vascular: There is atherosclerotic calcification of the major vessels at the base of the brain. Skull: Negative Sinuses/Orbits: Clear/normal Other: None IMPRESSION: 1. No acute CT finding. Generalized age related atrophy. Small acute infarctions seen in the right frontal opercular region in November by mri are not specifically identified by ct. No evidence of acute extension. 2. Atherosclerotic calcification of the major vessels at the base of the brain. Electronically Signed   By: Nelson Chimes M.D.   On: 06/12/2022 17:22   DG Chest Portable 1 View  Result Date: 06/03/2022 CLINICAL DATA:  Shortness of breath. EXAM: PORTABLE CHEST 1 VIEW COMPARISON:  May 03, 2022. FINDINGS: Stable cardiomegaly with mild central pulmonary vascular congestion. Mild bibasilar subsegmental atelectasis or edema is noted. Small pleural effusions may be present. Left-sided pacemaker is unchanged in position. Bony thorax is unremarkable. IMPRESSION: Stable cardiomegaly with mild central pulmonary vascular congestion. Mild bibasilar subsegmental atelectasis or edema is noted with small pleural effusions. Electronically Signed   By: Marijo Conception M.D.   On: 06/10/2022 15:57        Scheduled Meds:  Chlorhexidine  Gluconate Cloth  6 each Topical Daily   furosemide  20 mg Intravenous Q12H   levalbuterol  0.63 mg Nebulization Q6H   mouth rinse  15 mL Mouth Rinse 4 times per day   Continuous Infusions:  ampicillin-sulbactam (UNASYN) IV Stopped (05/26/22 1108)     LOS: 1 day    Time spent: 45 minutes    Irine Seal, MD [facility]   To contact the attending provider between 7A-7P or the covering provider during after hours 7P-7A, please log into the web site [email or web address] and access using universal [place] password for that web site. If you do not have the password, please call the hospital operator.  05/26/2022, 2:50 PM

## 2022-05-26 NOTE — Hospital Course (Signed)
Jerry Wood is a 88 y.o. male with medical history significant of Parkinson's disease, tachybradycardia syndrome status post PPM, CAD status post PCI, CKD, wheelchair-bound, recent hospitalization for acute CVA 05/09/2022 with left facial weakness left-sided weakness, discharged to SNF.  Noted to have been diagnosed with COVID-19 8 days prior to admission 05/17/2022 per daughter is at bedside and treated with antibiotics for COVID-pneumonia per family.  It was noted that over the past 3 to 4 days patient's condition has deteriorated, patient with decreased appetite, decreased oral intake, noted to have worsening baseline from dysarthric speech per family, some bouts of confusion and noted at facility to have sodium of 151.  Family denies any recent fevers or chills.  Denies any chest pain.  Per family patient hard of hearing and did endorse some shortness of breath whereby stated needed to sit up to breathe better over the past few days.  Per family no significant abdominal pain, no constipation.  Had a bout of diarrhea about 2 to 3 days ago but none since then.  Noted to have had a bloody stool per daughter a few days ago however no ongoing bloody stools.  Patient noted on evaluation to have a Cheyne-Stokes respiration with bouts of significant apnea.   ED Course: Patient seen in the ED, noted to have some bouts of Cheyne-Stokes respiration.

## 2022-05-26 NOTE — Progress Notes (Signed)
PT Cancellation Note  Patient Details Name: Jerry Wood MRN: [id number] DOB: 27-Jul-1921   Cancelled Treatment:    Reason Eval/Treat Not Completed: Other (comment); Defer PT until [place] determined.    [facility] 05/26/2022, 12:42 PM

## 2022-05-26 NOTE — Progress Notes (Signed)
SLP Cancellation Note  Patient Details Name: ABIGAIL MARSIGLIA MRN: [id number] DOB: 04/30/1922   Cancelled treatment:       Per RN patient has been made comfort measures.  Given this swallowing evaluation was not completed.  If we can be of further assistance please feel free to reconsult.  Thank you.     Shelly Flatten, MA, [facility] SLP [phone]  Lamar Sprinkles 05/26/2022, 2:11 PM

## 2022-05-26 NOTE — Assessment & Plan Note (Addendum)
-  Noted on respiratory viral panel. -Supportive care. -Patient now comfort care measures.

## 2022-05-26 NOTE — Consult Note (Signed)
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  Since being admitted, patient diagnosed with acute respiratory failure with hypoxia possibly type factorial reasons; patient seen to have [facility] respiration with periods of apnea.  Imaging has shown worsening of pulmonary edema concerning for CHF.  Patient has received management for possible infection and dehydration.  Palliative medicine team consulted to assist with complex medical decision making.  Extensive review of EMR prior to seeing patient.  Patient has ACP documentation in the EMR.  Patient has completed DNR form as well as MOST form.  Also discussed care with primary hospitalist prior to seeing patient.  Presented to bedside in a.m. to see patient.  Discussed care with bedside RN.  No family present with initial visit.  Patient ill-appearing still having periods of apnea.  Tachycardia noted with increased signs of work of breathing between periods of apnea.  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 Individuals present for the conversation: Patient unable to participate in complex medical decision making due to mental status.  Spoke with patient's 2 daughters at bedside who assist in medical decision making.  This provider was present for entirety of conversation.  Summary of the conversation:  Informed by bedside RN later in afternoon that patient's two daughters were at bedside.  Able to present and introduced myself and the role of the palliative medicine team in patient's care.  Able to learn about patient and deterioration of medical status.  Daughters stated that while they had discussed monitoring his status for the time being to see if he improves, he does not look like he is improving at this time.  Also discussed updated chest x-ray showing worsening edema.  Daughter stated that their primary goal for patient's care would not to be to "prolong" his situation if he is reaching the end of life.  They would not want him to suffer or be in pain.  They would want him to be allowed to have a natural death.  Based on goals for medical care, discussed medical care moving forward.  At this time patient showing signs of discomfort and worsening medical status.  Discussed continuing with medical pathway versus transitioning to comfort focused care.  Discussed what comfort focused care would and would not entail.  After discussions and explanations, determined to transition to comfort focused care at this time.  Discussed based on patient's current status, unsure if he will be able to leave the hospital versus will have an in-hospital death.  Noted would monitor after transition to comfort focused care to determine if there are would be an opportunity for transport to to get support from hospice outside of the hospital.  Would continue discussions based  on patient's evaluation.  Outcome of the conversations and/or documents completed:   Transition to comfort focused care at this time.  I spent 24 minutes providing separately identifiable ACP services with the patient and/or surrogate decision maker in a voluntary, in-person conversation discussing the patient's wishes and goals as detailed in the above note.  Chelsea Aus, DO Palliative Care Provider  -------------------------------------------------------------------------------------------------------------  All questions answered at that time.  Offered emotional support as able.  Noted palliative medicine team would continue to follow along.  Primary Diagnoses  Present on Admission:  Acute respiratory failure with hypoxia (HCC)  Parkinson disease  Chronic atrial fibrillation (HCC)  Coronary atherosclerosis of native coronary artery  Pure hypercholesterolemia  HTN (hypertension)  Tachycardia-bradycardia (HCC)  Presence of permanent cardiac pacemaker  Dehydration  Hypernatremia  Acute CVA (cerebrovascular accident) ([place])   Palliative Review of Systems: Patient appears to have increased work of breathing while also having periods of apnea.  Tachycardia noted.  Appears uncomfortable.  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 ECHO 06/04/11 LVEF estimated by 2D at 52.1%   Pure hypercholesterolemia    Seborrheic keratosis, inflamed    Skin lesion    Plan to remove left posterior ear lesion with electrocautery    Tachy-brady syndrome [facility])    s/p permanent pacemaker placement   Unspecified hypothyroidism    Vertigo    Vitamin D deficiency    Social History   Socioeconomic History   Marital status: Widowed    Spouse name: Not on file   Number of children: 2   Years of education: 7TH   Highest education level: Not on file  Occupational History   Not on file  Tobacco Use   Smoking status: Former    Years: 10.00    Types: Cigarettes    Quit date: 06/18/1948    Years since quitting: 73.9    Passive exposure: Past   Smokeless tobacco: Never  Vaping Use   Vaping Use: Never used  Substance and Sexual Activity   Alcohol use: No   Drug use: No   Sexual activity: Not on file  Other Topics Concern   Not on file  Social History Narrative   03/14/20 Patient lives at home with family.   Caffeine Use: rarely   Social Determinants of Health   Financial Resource Strain: Not on file  Food Insecurity: No Food Insecurity (05/06/2022)   Hunger Vital Sign    Worried About Running Out of Food in the Last Year: Never true    Ran Out of Food in the Last Year: Never true  Transportation Needs: No Transportation Needs (05/06/2022)   PRAPARE - Hydrologist (Medical): No    Lack of Transportation (Non-Medical): No  Physical Activity: Not on file  Stress: Not on file  Social Connections: Not on  file   Family History  Problem Relation Age of Onset   CAD Father    Diabetes Father    Bladder Cancer Other    Leukemia Other    Scheduled Meds:  Chlorhexidine Gluconate Cloth  6 each Topical Daily   enoxaparin (LOVENOX) injection  1 mg/kg Subcutaneous Q24H   furosemide  20 mg Intravenous Q12H   ipratropium  0.5 mg Nebulization Q6H   levalbuterol  0.63 mg Nebulization Q6H   [START ON 05/31/2022] levothyroxine  56.25 mcg Intravenous Daily   methylPREDNISolone (SOLU-MEDROL) injection  40 mg Intravenous Q12H   metoprolol tartrate  5 mg Intravenous Q8H   mouth rinse  15 mL  Mouth Rinse 4 times per day   sodium chloride flush  3 mL Intravenous Q12H   sodium chloride HYPERTONIC  4 mL Nebulization Daily   Continuous Infusions:  ampicillin-sulbactam (UNASYN) IV 3 g (05/26/22 1005)   dextrose 75 mL/hr at 05/26/22 1006   PRN Meds:.acetaminophen **OR** acetaminophen, lip balm, ondansetron **OR** ondansetron (ZOFRAN) IV, mouth rinse No Known Allergies CBC:    Component Value Date/Time   WBC 11.2 (H) 05/26/2022 0249   HGB 11.7 (L) 05/26/2022 0249   HGB 12.0 (L) 02/06/2022 0838   HCT 39.2 05/26/2022 0249   HCT 35.1 (L) 02/06/2022 0838   PLT 216 05/26/2022 0249   PLT 249 02/06/2022 0838   MCV 105.4 (H) 05/26/2022 0249   MCV 94 02/06/2022 0838   NEUTROABS 10.7 (H) 05/26/2022 0249   LYMPHSABS 0.2 (L) 05/26/2022 0249   MONOABS 0.1 05/26/2022 0249   EOSABS 0.0 05/26/2022 0249   BASOSABS 0.0 05/26/2022 0249   Comprehensive Metabolic Panel:    Component Value Date/Time   NA 146 (H) 05/26/2022 0249   NA 141 02/06/2022 0838   K 4.4 05/26/2022 0249   CL 118 (H) 05/26/2022 0249   CO2 18 (L) 05/26/2022 0249   BUN 24 (H) 05/26/2022 0249   BUN 20 02/06/2022 0838   CREATININE 1.06 05/26/2022 0249   CREATININE 1.34 (H) 03/16/2016 0829   GLUCOSE 178 (H) 05/26/2022 0249   CALCIUM 8.4 (L) 05/26/2022 0249   AST 25 05/26/2022 0249   ALT 11 05/26/2022 0249   ALKPHOS 69 05/26/2022 0249   BILITOT 0.8 05/26/2022 0249   BILITOT 0.7 02/06/2022 0838   PROT 6.1 (L) 05/26/2022 0249   PROT 6.0 02/06/2022 0838   ALBUMIN 2.7 (L) 05/26/2022 0249   ALBUMIN 3.7 02/06/2022 0838    Physical Exam: Vital Signs: BP 121/81   Pulse 95   Temp (!) 97 F (36.1 C) (Axillary)   Resp (!) 28   Ht '5\' 1"'$  (1.549 m)   Wt 68.4 kg   SpO2 99%   BMI 28.49 kg/m  SpO2: SpO2: 99 % O2 Device: O2 Device: Nasal Cannula O2 Flow Rate: O2 Flow Rate (L/min): 3 L/min Intake/output summary:  Intake/Output Summary (Last 24 hours) at 05/26/2022 1009 Last data filed at 05/26/2022 1006 Gross per 24  hour  Intake 1225.04 ml  Output 425 ml  Net 800.04 ml   LBM:   Baseline Weight: Weight: 66.5 kg Most recent weight: Weight: 68.4 kg  General: Increased work of breathing at times, ill-appearing, lethargic Eyes: No drainage noted HENT: Dry mucous membranes Cardiovascular: Tachycardia noted Respiratory: Cheyne-Stokes breathing with periods of apnea, increased work of breathing seen with accessory muscle use at times Abdomen: not distended Neuro: Lethargic, not able to participate in complex medical decision making discussions         Palliative  Performance Scale: 10%               Additional Data Reviewed: Recent Labs    06/16/2022 1520 06/07/2022 1600 05/26/22 0249  WBC 12.0*  --  11.2*  HGB 12.2*  --  11.7*  PLT 236  --  216  NA  --  151* 146*  BUN  --  23 24*  CREATININE  --  1.21 1.06    Imaging: Portable chest 1 View CLINICAL DATA:  Respiratory failure and hypoxia  EXAM: PORTABLE CHEST 1 VIEW  COMPARISON:  Film from the previous day.  FINDINGS: Cardiac shadow is enlarged but stable. Pacing device is again seen. Increasing central vascular congestion is noted with increasing edema. Small effusions are again noted. No focal confluent infiltrate is seen.  IMPRESSION: Worsening CHF with bilateral effusions.  Electronically Signed   By: Inez Catalina M.D.   On: 05/26/2022 04:02    I personally reviewed recent imaging.   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  Since being admitted, patient  diagnosed with acute respiratory failure with hypoxia possibly type factorial reasons; patient seen to have [facility] respiration with periods of apnea.  Imaging has shown worsening of pulmonary edema concerning for CHF.  Patient has received management for possible infection and dehydration.  Palliative medicine team consulted to assist with complex medical decision making.  # Complex medical decision making/goals of care  -# Complex medical decision making/goals of care  -Unable to participate in medical decision making secondary to his mental status.  -Spoke with patient's 2 daughters to assist in making medical decisions for him as described above in HPI.  Stated goal for patient's care moving forward would be to focus on comfort to make sure that he does not suffer at the end of his life.  Sided to transition to comfort focused care at this time.  -At this time we will discontinue interventions that are no longer focused on comfort such as IV fluids, imaging, or lab work.  Will instead focus on symptom management of pain, dyspnea, and agitation in the setting of end-of-life care.  - Code Status: DNR   # Symptom management    -Pain/Dyspnea, acute in the setting of end-of-life care                Patient was not on medications for pain previously.                               -Started IV Dilaudid 0.3 mg IV every 1 hour as needed.  Continue to adjust based on patient's symptom burden.  If patient needing frequent dosing, may need to consider continuous infusion.                  -Anxiety/agitation, in the setting of end-of-life care                               -Started IV Ativan 0.5 mg every 4 hours  as needed. Continue to adjust based on patient's symptom burden.                                 -And also has IV Haldol 0.5 mg every 4 hours as needed. Continue to adjust based on patient's symptom burden.                   -Secretions, in the setting of end-of-life care                                -Started IV glycopyrrolate 0.2 mg every 4 hours as needed.   -  Code Status: DNR   # Psycho-social/Spiritual Support:  - Support System: 2 daughters (only children)  # Discharge Planning: Transition to comfort focused care at this time.  Will continue to monitor her medical status.  At this time not stable for transfer to outside facility.  If becomes Kizzie Fantasia could consider potential transfer home with hospice versus inpatient hospice if requiring high level of symptom management.  Thank you for allowing the palliative care team to participate in the care [name].  Chelsea Aus, DO Palliative Care Provider PMT # [phone]  If patient remains symptomatic despite maximum doses, please call PMT at [phone] between 0700 and 1900. Outside of these hours, please call attending, as PMT does not have night coverage.

## 2022-05-27 ENCOUNTER — Encounter (HOSPITAL_COMMUNITY): Payer: Self-pay | Admitting: Internal Medicine

## 2022-05-27 ENCOUNTER — Other Ambulatory Visit: Payer: Self-pay

## 2022-05-27 DIAGNOSIS — I639 Cerebral infarction, unspecified: Secondary | ICD-10-CM | POA: Diagnosis not present

## 2022-05-27 DIAGNOSIS — U071 COVID-19: Secondary | ICD-10-CM

## 2022-05-27 DIAGNOSIS — B348 Other viral infections of unspecified site: Secondary | ICD-10-CM

## 2022-05-27 DIAGNOSIS — Z515 Encounter for palliative care: Secondary | ICD-10-CM

## 2022-05-27 DIAGNOSIS — R451 Restlessness and agitation: Secondary | ICD-10-CM

## 2022-05-27 DIAGNOSIS — I482 Chronic atrial fibrillation, unspecified: Secondary | ICD-10-CM

## 2022-05-27 DIAGNOSIS — R0902 Hypoxemia: Secondary | ICD-10-CM

## 2022-05-27 DIAGNOSIS — Z79899 Other long term (current) drug therapy: Secondary | ICD-10-CM

## 2022-05-27 DIAGNOSIS — I2510 Atherosclerotic heart disease of native coronary artery without angina pectoris: Secondary | ICD-10-CM

## 2022-05-27 DIAGNOSIS — Z7189 Other specified counseling: Secondary | ICD-10-CM

## 2022-05-27 DIAGNOSIS — E870 Hyperosmolality and hypernatremia: Secondary | ICD-10-CM | POA: Diagnosis not present

## 2022-05-27 DIAGNOSIS — R52 Pain, unspecified: Secondary | ICD-10-CM

## 2022-05-27 DIAGNOSIS — J9601 Acute respiratory failure with hypoxia: Secondary | ICD-10-CM | POA: Diagnosis not present

## 2022-05-27 DIAGNOSIS — E860 Dehydration: Secondary | ICD-10-CM | POA: Diagnosis not present

## 2022-05-27 MED ORDER — SODIUM CHLORIDE 0.9 % IV SOLN: INTRAVENOUS | Status: DC | PRN

## 2022-05-27 MED ORDER — MORPHINE SULFATE (PF) 2 MG/ML IV SOLN
1.0000 mg | INTRAVENOUS | Status: DC | PRN
  Administered 2022-05-27 (×2): 1 mg via INTRAVENOUS
  Filled 2022-05-27 (×2): qty 1

## 2022-05-27 MED ORDER — METOPROLOL TARTRATE 5 MG/5ML IV SOLN
2.5000 mg | Freq: Three times a day (TID) | INTRAVENOUS | Status: DC
  Filled 2022-05-27: qty 5

## 2022-05-27 MED ORDER — MORPHINE 100MG IN NS 100ML (1MG/ML) PREMIX INFUSION
2.0000 mg/h | INTRAVENOUS | Status: DC
  Administered 2022-05-27: 1 mg/h via INTRAVENOUS
  Administered 2022-05-29 – 2022-05-31 (×2): 2 mg/h via INTRAVENOUS
  Filled 2022-05-27 (×3): qty 100

## 2022-05-27 MED ORDER — MORPHINE SULFATE (PF) 2 MG/ML IV SOLN
1.0000 mg | INTRAVENOUS | Status: DC | PRN
  Administered 2022-05-28: 1 mg via INTRAVENOUS

## 2022-05-27 MED ORDER — MORPHINE SULFATE (PF) 2 MG/ML IV SOLN
1.0000 mg | Freq: Once | INTRAVENOUS | Status: AC
  Administered 2022-05-27: 1 mg via INTRAVENOUS
  Filled 2022-05-27: qty 1

## 2022-05-27 NOTE — Progress Notes (Signed)
       Overnight   NAME: Jerry Wood MRN: [id number] DOB : 1921-07-26    Date of Service   05/27/2022   HPI/Events of Note    88 year old male medical history significant of Parkinson's disease, tachybradycardia syndrome status post PPM, CAD status post PCI, CKD, wheelchair-bound, recent CVA 05/10/2022. Diagnosed with COVID-19 8 days prior to admission 05/17/2022.  Currently consult for Palliative care/comfort care.  Notified by RN for increased work of breathing, potential air hunger. The floor Dilaudid has been ineffective.  Previous trial of low-dose morphine alleviated condition. One-time dose of morphine ordered for recurrence of previous indicated.      Interventions/ Plan   Increased work of breathing/air hunger Low-dose morphine one-time dose ordered       Gershon Cull BSN MSNA MSN [place]

## 2022-05-27 NOTE — TOC Progression Note (Signed)
Transition of Care [facility]) - Progression Note    Patient Details  Name: Jerry Wood MRN: [id number] Date of Birth: [birth year]  Transition of Care [facility]) CM/SW Contact  Henrietta Dine, RN Phone Number: 05/27/2022, 2:52 PM  Clinical Narrative:    Pt from [facility]; [facility] consulted; pt transitioned to full comfort care; no TOC needs at this time; consult TOC if needed.       Expected Discharge Plan and Services                                                 Social Determinants of Health (SDOH) Interventions    Readmission Risk Interventions     No data to display

## 2022-05-27 NOTE — Progress Notes (Signed)
Daily Progress Note   Patient Name: Jerry Wood       Date: 05/27/2022 DOB: [birth year]  Age: 88 y.o. MRN#: [id number] Attending Physician: Eugenie Filler, MD Primary Care Physician: Johna Roles, [place] Admit Date: 05/26/2022 Length of Stay: 2 days  Reason for Consultation/Follow-up: Establishing goals of care and Terminal Care  Subjective:   CC: Patient more awake this morning though greatly increased work of breathing.  Following up regarding complex medical decision making and symptom management at the end of life.  Subjective: Extensive review of EMR prior to seeing patient.  At time of EMR review over the past 24 hours patient has received IV Dilaudid 0.3 mg x 3 doses and morphine 1 mg x 1 dose.  Patient's respiratory rate continued to remain elevated overnight.  Discussed care with bedside RN prior to seeing patient.  Presented to bedside to check on patient.  Both of patient's daughters were present at bedside.  Able to discuss patient's continued symptom management.  Patient himself is much more awake and alert this morning.  Patient admits to increased work of breathing and wanting to receive medications to help him feel better with his breathing.  Discussed with daughter that since patient needing more frequent dosing for dyspnea management, would recommend starting on continuous morphine infusion for management.  Patient and daughter are agreeable with this.  All questions answered at that time.  Noted patient will remain in the hospital today and will continue to be monitored.  Reevaluate tomorrow to determine continued disposition planning.  Will appreciate TOC's assistance with this.  Review of Systems Patient notes increased work of breathing. Objective:   Vital Signs:  BP 116/74   Pulse 78   Temp 97.6 F (36.4 C) (Axillary)   Resp 18   Ht '5\' 1"'$  (1.549 m)   Wt 68.3 kg   SpO2 99%   BMI 28.45 kg/m   Physical Exam: General: Increased work of breathing ,  ill-appearing, lethargic, awake and interactive Eyes: No drainage noted HENT: Dry mucous membranes Cardiovascular: Tachycardia noted Respiratory: Cheyne-Stokes breathing with periods of apnea, increased work of breathing seen with accessory muscle use at times Abdomen: not distended Neuro: Awake and interactive  Imaging: I personally reviewed recent imaging.   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  Since being admitted, patient diagnosed with acute respiratory failure with hypoxia possibly type factorial reasons; patient seen to have [facility] respiration with periods of apnea.  Imaging has shown worsening of pulmonary edema concerning for CHF.  Patient has received management for possible infection and dehydration.  Palliative medicine team consulted to assist with complex medical decision making.   Recommendations/Plan: # Complex medical decision making/goals of care:  - Patient was transition to comfort focused care on 05/26/2022.  Continuing with comfort focused care at this time.  Discussed care with daughters at bedside.  Based on patient's symptom burden will be starting continuous infusion for dyspnea management.  Will continue to reevaluate to determine if patient stable for transfer potentially to inpatient hospice facility.  -  Code Status: DNR Prognosis: Hours to days  # Symptom management:   -Pain/Dyspnea, acute in  the setting of end-of-life care           Patient's symptom of dyspnea not controlled at this time.  Continue to adjust medications to support appropriate symptom  management at the end of life.   -Start continuous basal morphine infusion at 1 mg/h.  Increase RN bolus to 1-2 mg every hour as needed for breakthrough management.  Please continue to adjust both basal and bolus doses based on patient's symptom burden.                 -Anxiety/agitation, in the setting of end-of-life care                               -Continue IV Ativan 0.5 mg every 4 hours as needed. Continue to adjust based on patient's symptom burden.                                 -And also has IV Haldol 0.5 mg every 4 hours as needed. Continue to adjust based on patient's symptom burden.                   -Secretions, in the setting of end-of-life care                               -Continue IV glycopyrrolate 0.2 mg every 4 hours as needed.  # Psychosocial Support:  - Support System: 2 daughters (only children)   # Discharge Planning: Continuing with comfort focused care at this time.  Will continue to monitor her medical status.  At this time not stable for transfer to outside facility.  If becomes stable could consider potential transfer either home or to inpatient hospice facility if appropriate for GIP management.  Discussed with: Patient, daughters, bedside RN  Thank you for allowing the palliative care team to participate in the care [name].  Chelsea Aus, DO Palliative Care Provider PMT # [phone]  If patient remains symptomatic despite maximum doses, please call PMT at [phone] between 0700 and 1900. Outside of these hours, please call attending, as PMT does not have night coverage.

## 2022-05-27 NOTE — Progress Notes (Signed)
PROGRESS NOTE    Jerry Wood  ZOX:[id number] DOB: 12-11-1921 DOA: 06/17/2022 PCP: Johna Roles, PA    Chief Complaint  Patient presents with   Shortness of Breath    Brief Narrative:  Jerry Wood is a 88 y.o. male with medical history significant of Parkinson's disease, tachybradycardia syndrome status post PPM, CAD status post PCI, CKD, wheelchair-bound, recent hospitalization for acute CVA 05/09/2022 with left facial weakness left-sided weakness, discharged to SNF.  Noted to have been diagnosed with COVID-19 8 days prior to admission 05/17/2022 per daughter is at bedside and treated with antibiotics for COVID-pneumonia per family.  It was noted that over the past 3 to 4 days patient's condition has deteriorated, patient with decreased appetite, decreased oral intake, noted to have worsening baseline from dysarthric speech per family, some bouts of confusion and noted at facility to have sodium of 151.  Family denies any recent fevers or chills.  Denies any chest pain.  Per family patient hard of hearing and did endorse some shortness of breath whereby stated needed to sit up to breathe better over the past few days.  Per family no significant abdominal pain, no constipation.  Had a bout of diarrhea about 2 to 3 days ago but none since then.  Noted to have had a bloody stool per daughter a few days ago however no ongoing bloody stools.  Patient noted on evaluation to have a Cheyne-Stokes respiration with bouts of significant apnea.   ED Course: Patient seen in the ED, noted to have some bouts of Cheyne-Stokes respiration.      Assessment & Plan:  Principal Problem:   Acute respiratory failure with hypoxia (HCC) Active Problems:   Parkinson disease   Chronic atrial fibrillation (HCC)   Coronary atherosclerosis of native coronary artery   Pure hypercholesterolemia   HTN (hypertension)   Tachycardia-bradycardia (HCC)   Presence of permanent cardiac pacemaker   Acute CVA  (cerebrovascular accident) ([place])   Hypoxia   Cheyne-Stokes respiration   Dehydration   Hypernatremia   History of COVID-19: Recent infection diagnosed 05/17/2022 per family   Abnormal breathing   Infection due to human metapneumovirus (hMPV)   COVID-19 with multiple comorbidities   Dyspnea   Pain   Agitation   High risk medication use   Goals of care, counseling/discussion   Palliative care encounter    Assessment and Plan: * Acute respiratory failure with hypoxia ([place]) -Patient presented with acute respiratory failure with hypoxia, requiring O2, noted on initial presentation to the ED to be tachycardic, tachypneic with respiratory rates as high as 35, noted to be in A-fib heart rate as high as 170s. -Patient with some Cheyne-Stokes respiration noted. -??  Etiology.  Concern for also possible aspiration pneumonia with recent history of CVA, in the setting of recent COVID-19 infection.  Concern for volume overload. -Head CT done with no acute infarct noted, patient noted to have recent CVA. -Concern for CHF as BNP elevated, chest x-ray with some cardiomegaly. -Patient however clinically dry on examination. -Unable to tell if any acute infiltrate.  Patient with a leukocytosis however recently been on steroids. -Patient seen by PCCM who feel NT suction may be uncomfortable for the patient at this time and holding off. -VBG done with a pH of 7.41, pCO2 of 36, pO2 of 63, bicarb of 29. -Likely not a candidate for BiPAP at this time as concern patient unable to handle his own secretions. -Patient clinically dry on examination, elevated sodium level at  151 on admission with D5W down to 146 on 05/26/2022., recent poor oral intake in the setting of recent COVID-19 PCR pneumonia 8 days prior to admission. -Respiratory viral panel positive for metapneumovirus.  Troponin elevated by flattened. -Procalcitonin negative. -CRP 6.1>>> 6.3. -Chest x-ray done the morning of 05/26/2022, with worsening CHF  with bilateral effusions. -2D echo done with EF of 55 to 60%, NWMA, severely dilated left atrial size and severely dilated right atrial size.  Moderate to severe MVR that appears to have progressed significantly compared to prior 2D echo.  -Status post Robinul IV x 1 with some improvement with secretions on exam. -Continue IV Unasyn and discontinue after today's doses to complete a 3-day course of treatment. -Continue IV Lasix for 2-3 more doses. -Patient noted to have increased work of breathing earlier this morning, potential for air hunger, noted per NP that the floor Dilaudid has been ineffective and patient given IV morphine with some improvement. -Placed on morphine 1 mg every 2 hours as needed for increased work of breathing, shortness of breath. -Patient with a poor prognosis, palliative care consulted to discuss with family and decision has been made to transition to comfort measures.  Infection due to human metapneumovirus (hMPV) -Noted on respiratory viral panel. -Supportive care.  History of COVID-19: Recent infection diagnosed 05/17/2022 per family -Per daughter patient recently diagnosed with COVID-19 infection 05/17/2022 and treated for pneumonia with antibiotics at that time.  Patient noted to also have been on steroids as well. -Patient seen by palliative care and decision made to transition to full comfort measures.  Hypernatremia -Likely secondary to hypovolemic hypernatremia. -Patient recently hospitalized for acute CVA and discharged on 05/09/2022. -Patient also noted to be recently diagnosed with COVID-19 per daughter on 05/17/2022 and treated for pneumonia with antibiotics. -Patient noted with poor oral intake. -Status post half-normal saline 1 L bolus x 1.   -Patient also given D5W with some improvement with sodium down to 146 on 05/26/2022.  -Patient seen by palliative care and decision made to transition to full comfort measures.   Dehydration -Half-normal saline 1  L bolus x 1 given on admission. -Was on IV fluids of D5W at 75 cc an hour which have been discontinued.   -Patient seen by palliative care decision made to transition to full comfort measures.  Cheyne-Stokes respiration -??  Etiology -Head CT negative for any acute CVA. -See acute respiratory failure with hypoxia above.  Acute CVA (cerebrovascular accident) ([place]) -Noted to have history of recent CVA in the setting of A-fib felt likely embolic in nature. -Patient discharged on Eliquis. -Anticoagulation initially held due to respiratory issues and patient has been placed on full dose Lovenox, statin and Zetia was held.   -Patient seen by palliative care and decision made to transition to full comfort measures and as such nonessential medications have been discontinued per palliative care.   Tachycardia-bradycardia ([place]) -See chronic A-fib above.  HTN (hypertension) -IV Lopressor.  Pure hypercholesterolemia -Hold Zetia.  Coronary atherosclerosis of native coronary artery -IV Lopressor. -Patient seen by palliative care and decision made to transition to full comfort measures.    Chronic atrial fibrillation (HCC) -Patient with history of tachybradycardia syndrome status post PPM. -Placed back on IV Lopressor every 8 hours.  -2D echo done with EF of 55 to 60%, NWMA, severely dilated left atrial size and severely dilated right atrial size.  Moderate to severe MVR that appears to have progressed significantly compared to prior 2D echo . -Patient noted to have been  on Eliquis prior to admission, on admission placed on full dose Lovenox which has been discontinued as patient seen by palliative care and decision made to transition to full comfort measures.   Parkinson disease -Continue to hold home regimen of medications. -Patient seen by palliative care and decision made to transition to comfort measures.         DVT prophylaxis: Lovenox Code Status: DNR Family Communication: No  family at bedside. Disposition: Patient now comfort care.  Likely in-hospital death.  Status is: Inpatient Remains inpatient appropriate because: Severity of illness   Consultants:  Palliative care: Dr. Vinetta Bergamo 05/26/2022  Procedures:  CT head 05/24/2022 Chest x-ray 05/19/2022, 05/26/2022 2D echo 05/26/2022  Antimicrobials:  IV Unasyn 05/26/2022>>> 05/28/2022   Subjective: Sitting up in bed.  Alert.  States does not feel too well today.  Complaining of congestion/secretions in throat unable to mobilize or get up.  Gurgling rhonchorous breath sounds improved.  No chest pain.  No abdominal pain.  Events early this morning noted as patient noted with increased work of breathing, potential for air hunger, noted for Dilaudid ineffective and some improvement on low-dose morphine.  Objective: Vitals:   05/26/22 2100 05/27/22 0000 05/27/22 0400 05/27/22 0500  BP: 116/74     Pulse: 92 98 78   Resp: '18 18 18   '$ Temp:      TempSrc:      SpO2: 95% 97% 99%   Weight:    68.3 kg  Height:        Intake/Output Summary (Last 24 hours) at 05/27/2022 1015 Last data filed at 05/27/2022 0635 Gross per 24 hour  Intake 199.09 ml  Output 1360 ml  Net -1160.91 ml   Filed Weights   05/31/2022 1941 05/26/22 0400 05/27/22 0500  Weight: 66.5 kg 68.4 kg 68.3 kg    Examination:  General exam: Dry mucous membranes. Respiratory system: Decreased rhonchorous breath sounds anterior lung fields.  Some decreased breath sounds in the bases.  Improved crackles. Cardiovascular system: Irregularly irregular.  Unable to assess JVD.  3/6 SEM.  No significant lower extremity edema.   Gastrointestinal system: Abdomen soft, nontender, nondistended, positive bowel sounds.  No rebound.  No guarding.  Central nervous system: More alert today.  Moving extremities spontaneously.  Extremities: Symmetric 5 x 5 power. Skin: No rashes, lesions or ulcers Psychiatry: Judgement and insight unable to assess.  Mood and affect unable  to assess.     Data Reviewed:   CBC: Recent Labs  Lab 06/08/2022 1520 05/26/22 0249  WBC 12.0* 11.2*  NEUTROABS 10.6* 10.7*  HGB 12.2* 11.7*  HCT 40.0 39.2  MCV 103.6* 105.4*  PLT 236 606    Basic Metabolic Panel: Recent Labs  Lab 05/31/2022 1600 05/26/22 0249  NA 151* 146*  K 4.8 4.4  CL 122* 118*  CO2 21* 18*  GLUCOSE 112* 178*  BUN 23 24*  CREATININE 1.21 1.06  CALCIUM 8.6* 8.4*  MG  --  2.3    GFR: Estimated Creatinine Clearance: 32.3 mL/min (by C-G formula based on SCr of 1.06 mg/dL).  Liver Function Tests: Recent Labs  Lab 05/22/2022 1600 05/26/22 0249  AST 36 25  ALT 6 11  ALKPHOS 68 69  BILITOT 1.1 0.8  PROT 6.2* 6.1*  ALBUMIN 2.7* 2.7*    CBG: Recent Labs  Lab 05/26/22 0748  GLUCAP 149*     Recent Results (from the past 240 hour(s))  Blood culture (routine x 2)     Status: None (Preliminary result)  Collection Time: 05/19/2022  3:20 PM   Specimen: BLOOD RIGHT HAND  Result Value Ref Range Status   Specimen Description   Final    BLOOD RIGHT HAND Performed at [facility], [address] [place], [place] [address]    Special Requests   Final    BOTTLES DRAWN AEROBIC AND ANAEROBIC Blood Culture results may not be optimal due to an inadequate volume of blood received in culture bottles Performed at [facility] [address], [place], [place] [address]    Culture   Final    NO GROWTH < 24 HOURS Performed at [facility] [address] [place], [place] [address]    Report Status PENDING  Incomplete  Respiratory (~20 pathogens) panel by PCR     Status: Abnormal   Collection Time: 06/16/2022  8:04 PM   Specimen: Nasopharyngeal Swab; Respiratory  Result Value Ref Range Status   Adenovirus NOT DETECTED NOT DETECTED Final   Coronavirus 229E NOT DETECTED NOT DETECTED Final    Comment: (NOTE) The Coronavirus on the Respiratory Panel, DOES NOT test for the novel  Coronavirus (2019 nCoV)    Coronavirus HKU1  NOT DETECTED NOT DETECTED Final   Coronavirus NL63 NOT DETECTED NOT DETECTED Final   Coronavirus OC43 NOT DETECTED NOT DETECTED Final   Metapneumovirus DETECTED (A) NOT DETECTED Final   Rhinovirus / Enterovirus NOT DETECTED NOT DETECTED Final   Influenza A NOT DETECTED NOT DETECTED Final   Influenza B NOT DETECTED NOT DETECTED Final   Parainfluenza Virus 1 NOT DETECTED NOT DETECTED Final   Parainfluenza Virus 2 NOT DETECTED NOT DETECTED Final   Parainfluenza Virus 3 NOT DETECTED NOT DETECTED Final   Parainfluenza Virus 4 NOT DETECTED NOT DETECTED Final   Respiratory Syncytial Virus NOT DETECTED NOT DETECTED Final   Bordetella pertussis NOT DETECTED NOT DETECTED Final   Bordetella Parapertussis NOT DETECTED NOT DETECTED Final   Chlamydophila pneumoniae NOT DETECTED NOT DETECTED Final   Mycoplasma pneumoniae NOT DETECTED NOT DETECTED Final    Comment: Performed at [facility], [address] [place], [place] [address]  MRSA Next Gen by PCR, Nasal     Status: None   Collection Time: 06/07/2022 11:34 PM   Specimen: Nasal Mucosa; Nasal Swab  Result Value Ref Range Status   MRSA by PCR Next Gen NOT DETECTED NOT DETECTED Final    Comment: (NOTE) The GeneXpert MRSA Assay (FDA approved for NASAL specimens only), is one component of a comprehensive MRSA colonization surveillance program. It is not intended to diagnose MRSA infection nor to guide or monitor treatment for MRSA infections. Test performance is not FDA approved in patients less than 56 years old. Performed at [facility], [place] [address] [place], [place] [address]   Blood culture (routine x 2)     Status: None (Preliminary result)   Collection Time: 05/26/22  2:51 AM   Specimen: BLOOD  Result Value Ref Range Status   Specimen Description   Final    BLOOD BLOOD RIGHT HAND IN PEDIATRIC BOTTLE Performed at [facility] [address], [place], [place] [address]    Special Requests    Final    NONE Performed at [facility], [place] [address], [place], [place] [address]    Culture   Final    NO GROWTH < 12 HOURS Performed at [facility] [address], [place], [place] [address]    Report  Status PENDING  Incomplete         Radiology Studies: ECHOCARDIOGRAM COMPLETE  Result Date: 05/26/2022    ECHOCARDIOGRAM REPORT   Patient Name:   SHAWNMICHAEL PARENTEAU Date of Exam: 05/26/2022 Medical Rec #:  [id number]      Height:       61.0 in Accession #:    [id number]     Weight:       150.8 lb Date of Birth:  [birth year]      BSA:          1.675 m Patient Age:    34 years       BP:           123/99 mmHg Patient Gender: M              HR:           83 bpm. Exam Location:  Inpatient Procedure: 2D Echo, Cardiac Doppler and Color Doppler Indications:    Elevated troponins  History:        Patient has prior history of Echocardiogram examinations, most                 recent 05/04/2022. CAD, Pacemaker, Stroke and COPD,                 Arrythmias:Tachycardia, Bradycardia and Atrial Fibrillation;                 Risk Factors:Hypertension and Dyslipidemia. CKD.  Sonographer:    Eartha Inch Referring Phys: [id number] Wakisha Alberts V Felicia Bloomquist  Sonographer Comments: Technically difficult study due to poor echo windows. Image acquisition challenging due to patient body habitus, Image acquisition challenging due to respiratory motion and Image acquisition challenging due to COPD. IMPRESSIONS  1. Left ventricular ejection fraction, by estimation, is 55 to 60%. The left ventricle has normal function. The left ventricle has no regional wall motion abnormalities. Diastolic function indeterminant due to Afib.  2. Right ventricular systolic function is mildly reduced. The right ventricular size is mildly-to-moderately enlarged. There is moderately elevated pulmonary artery systolic pressure. The estimated right ventricular systolic pressure is 45.3 mmHg.  3. Left atrial size was severely dilated.  4.  Right atrial size was severely dilated.  5. The mitral valve is degenerative. There is moderate to severe mitral regurgitation that appears to have progressed significantly compared to prior TTE. Could consider TEE if clinically indicated.  6. Tricuspid valve regurgitation is moderate.  7. The aortic valve is tricuspid. There is moderate calcification of the aortic valve. There is moderate thickening of the aortic valve. Aortic valve regurgitation is trivial. Possible mild AS although suboptimal doppler interrogation. Was noted on prior TTE.  8. The inferior vena cava is normal in size with greater than 50% respiratory variability, suggesting right atrial pressure of 3 mmHg. Comparison(s): Prior images reviewed side by side. Compared to prior TTE 05/04/2022, there is now moderate to severe MR (previously mild) and moderate TR (previously mild). FINDINGS  Left Ventricle: Left ventricular ejection fraction, by estimation, is 55 to 60%. The left ventricle has normal function. The left ventricle has no regional wall motion abnormalities. The left ventricular internal cavity size was normal in size. There is  no left ventricular hypertrophy. Diastolic function indeterminant due to Afib. Right Ventricle: The right ventricular size is mildly-to-moderately enlarged. No increase in right ventricular wall thickness. Right ventricular systolic function is mildly reduced. There is moderately elevated pulmonary artery systolic pressure. The tricuspid regurgitant velocity is 3.34 m/s, and  with an assumed right atrial pressure of 3 mmHg, the estimated right ventricular systolic pressure is 78.2 mmHg. Left Atrium: Left atrial size was severely dilated. Right Atrium: Right atrial size was severely dilated. Pericardium: Trivial pericardial effusion is present. The pericardial effusion is anterior to the right ventricle. Mitral Valve: The mitral valve is degenerative in appearance. There is mild thickening of the mitral valve  leaflet(s). There is mild calcification of the mitral valve leaflet(s). Moderate to severe mitral valve regurgitation. Tricuspid Valve: The tricuspid valve is normal in structure. Tricuspid valve regurgitation is moderate. Aortic Valve: The aortic valve is tricuspid. There is moderate calcification of the aortic valve. There is moderate thickening of the aortic valve. Aortic valve regurgitation is trivial. Possible mild AS although suboptimal doppler interrogation. Was noted on prior TTE. Pulmonic Valve: The pulmonic valve was grossly normal. Pulmonic valve regurgitation is trivial. Aorta: The aortic root is normal in size and structure. Venous: The inferior vena cava is normal in size with greater than 50% respiratory variability, suggesting right atrial pressure of 3 mmHg. IAS/Shunts: The atrial septum is grossly normal. Additional Comments: A device lead is visualized.  LEFT VENTRICLE PLAX 2D LVIDd:         3.40 cm     Diastology LVIDs:         2.20 cm     LV e' medial:    6.13 cm/s LV PW:         0.90 cm     LV E/e' medial:  13.6 LV IVS:        0.80 cm     LV e' lateral:   8.50 cm/s LVOT diam:     1.70 cm     LV E/e' lateral: 9.8 LV SV:         27 LV SV Index:   16 LVOT Area:     2.27 cm  LV Volumes (MOD) LV vol d, MOD A2C: 72.9 ml LV vol d, MOD A4C: 79.6 ml LV vol s, MOD A2C: 38.8 ml LV vol s, MOD A4C: 33.1 ml LV SV MOD A2C:     34.1 ml LV SV MOD A4C:     79.6 ml LV SV MOD BP:      43.0 ml RIGHT VENTRICLE            IVC RV S prime:     9.29 cm/s  IVC diam: 1.90 cm TAPSE (M-mode): 1.6 cm LEFT ATRIUM              Index         RIGHT ATRIUM           Index LA diam:        5.20 cm  3.10 cm/m    RA Area:     21.50 cm LA Vol (A2C):   149.0 ml 88.95 ml/m   RA Volume:   63.30 ml  37.79 ml/m LA Vol (A4C):   224.0 ml 133.72 ml/m LA Biplane Vol: 196.0 ml 117.00 ml/m  AORTIC VALVE LVOT Vmax:   67.90 cm/s LVOT Vmean:  48.700 cm/s LVOT VTI:    0.119 m  AORTA Ao Root diam: 2.90 cm MITRAL VALVE               TRICUSPID  VALVE MV Area (PHT): 4.06 cm    TR Peak grad:   44.6 mmHg MV Decel Time: 187 msec    TR Vmax:        334.00 cm/s MV E  velocity: 83.43 cm/s                            SHUNTS                            Systemic VTI:  0.12 m                            Systemic Diam: 1.70 cm Gwyndolyn Kaufman MD Electronically signed by Gwyndolyn Kaufman MD Signature Date/Time: 05/26/2022/11:22:31 AM    Final    Portable chest 1 View  Result Date: 05/26/2022 CLINICAL DATA:  Respiratory failure and hypoxia EXAM: PORTABLE CHEST 1 VIEW COMPARISON:  Film from the previous day. FINDINGS: Cardiac shadow is enlarged but stable. Pacing device is again seen. Increasing central vascular congestion is noted with increasing edema. Small effusions are again noted. No focal confluent infiltrate is seen. IMPRESSION: Worsening CHF with bilateral effusions. Electronically Signed   By: Inez Catalina M.D.   On: 05/26/2022 04:02   CT HEAD WO CONTRAST (5MM)  Result Date: 06/16/2022 CLINICAL DATA:  Neuro deficit, acute, stroke suspected. Abnormal breathing pattern. Recent stroke. EXAM: CT HEAD WITHOUT CONTRAST TECHNIQUE: Contiguous axial images were obtained from the base of the skull through the vertex without intravenous contrast. RADIATION DOSE REDUCTION: This exam was performed according to the departmental dose-optimization program which includes automated exposure control, adjustment of the mA and/or kV according to patient size and/or use of iterative reconstruction technique. COMPARISON:  MRI 05/04/2022.  CT 05/03/2022. FINDINGS: Brain: Generalized age related atrophy. No focal abnormality seen affecting the brainstem or cerebellum. No focal or acute supra tentorial stroke identified. Small acute infarctions seen in the right frontal opercular region in November are not specifically identified. No evidence of acute extension. No swelling, mass, hemorrhage, hydrocephalus or extra-axial collection. Vascular: There is atherosclerotic  calcification of the major vessels at the base of the brain. Skull: Negative Sinuses/Orbits: Clear/normal Other: None IMPRESSION: 1. No acute CT finding. Generalized age related atrophy. Small acute infarctions seen in the right frontal opercular region in November by mri are not specifically identified by ct. No evidence of acute extension. 2. Atherosclerotic calcification of the major vessels at the base of the brain. Electronically Signed   By: Nelson Chimes M.D.   On: 05/27/2022 17:22   DG Chest Portable 1 View  Result Date: 06/07/2022 CLINICAL DATA:  Shortness of breath. EXAM: PORTABLE CHEST 1 VIEW COMPARISON:  May 03, 2022. FINDINGS: Stable cardiomegaly with mild central pulmonary vascular congestion. Mild bibasilar subsegmental atelectasis or edema is noted. Small pleural effusions may be present. Left-sided pacemaker is unchanged in position. Bony thorax is unremarkable. IMPRESSION: Stable cardiomegaly with mild central pulmonary vascular congestion. Mild bibasilar subsegmental atelectasis or edema is noted with small pleural effusions. Electronically Signed   By: Marijo Conception M.D.   On: 06/12/2022 15:57        Scheduled Meds:  Chlorhexidine Gluconate Cloth  6 each Topical Daily   furosemide  20 mg Intravenous Q12H   metoprolol tartrate  2.5 mg Intravenous Q8H   mouth rinse  15 mL Mouth Rinse 4 times per day   Continuous Infusions:  ampicillin-sulbactam (UNASYN) IV 3 g (05/27/22 0802)     LOS: 2 days    Time spent: 45 minutes    Irine Seal, MD [facility]   To contact the  attending provider between 7A-7P or the covering provider during after hours 7P-7A, please log into the web site [email or web address] and access using universal [place] password for that web site. If you do not have the password, please call the hospital operator.  05/27/2022, 10:15 AM

## 2022-05-28 DIAGNOSIS — J9601 Acute respiratory failure with hypoxia: Secondary | ICD-10-CM | POA: Diagnosis not present

## 2022-05-28 DIAGNOSIS — E860 Dehydration: Secondary | ICD-10-CM | POA: Diagnosis not present

## 2022-05-28 DIAGNOSIS — I639 Cerebral infarction, unspecified: Secondary | ICD-10-CM | POA: Diagnosis not present

## 2022-05-28 DIAGNOSIS — E870 Hyperosmolality and hypernatremia: Secondary | ICD-10-CM | POA: Diagnosis not present

## 2022-05-28 DIAGNOSIS — Z515 Encounter for palliative care: Secondary | ICD-10-CM | POA: Diagnosis not present

## 2022-05-28 DIAGNOSIS — R451 Restlessness and agitation: Secondary | ICD-10-CM | POA: Diagnosis not present

## 2022-05-28 MED ORDER — MORPHINE BOLUS VIA INFUSION
1.0000 mg | INTRAVENOUS | Status: DC | PRN
  Administered 2022-05-28: 2 mg via INTRAVENOUS
  Administered 2022-05-28: 1 mg via INTRAVENOUS
  Administered 2022-05-29: 2 mg via INTRAVENOUS
  Administered 2022-05-29: 1 mg via INTRAVENOUS
  Administered 2022-05-29 – 2022-05-30 (×2): 2 mg via INTRAVENOUS

## 2022-05-28 NOTE — Progress Notes (Signed)
Chaplain engaged in an initial visit with Jerry Wood's oldest daughter, who has been his caregiver.  She described Jerry Wood as a good father and good man.  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  Jerry Wood loved to sing at church and sung for the last time at the age of 3.  He was also in a quartet group with his brothers.  Jerry Wood valued reading and listening to the word of God. He is the 5th child of 10 children.  He has one surviving sister and two daughters.   Daughter shared that she and her father had been able to have pertinent discussions around end of life.  She knows that he would not want to suffer.  Daughter shared a healthy understanding of her father transitioning.  Chaplain offered space for storytelling and reflective listening, as well as support as needed.    05/28/22 1600  Clinical Encounter Type  Visited With Patient and family together  Visit Type Initial;Spiritual support  Referral From Palliative care team  Consult/Referral To Chaplain

## 2022-05-28 NOTE — Progress Notes (Signed)
PROGRESS NOTE    Jerry Wood  IPJ:[id number] DOB: Apr 15, 1922 DOA: 06/11/2022 PCP: Johna Roles, PA    Chief Complaint  Patient presents with   Shortness of Breath    Brief Narrative:  Jerry Wood is a 88 y.o. male with medical history significant of Parkinson's disease, tachybradycardia syndrome status post PPM, CAD status post PCI, CKD, wheelchair-bound, recent hospitalization for acute CVA 05/09/2022 with left facial weakness left-sided weakness, discharged to SNF.  Noted to have been diagnosed with COVID-19 8 days prior to admission 05/17/2022 per daughter is at bedside and treated with antibiotics for COVID-pneumonia per family.  It was noted that over the past 3 to 4 days patient's condition has deteriorated, patient with decreased appetite, decreased oral intake, noted to have worsening baseline from dysarthric speech per family, some bouts of confusion and noted at facility to have sodium of 151.  Family denies any recent fevers or chills.  Denies any chest pain.  Per family patient hard of hearing and did endorse some shortness of breath whereby stated needed to sit up to breathe better over the past few days.  Per family no significant abdominal pain, no constipation.  Had a bout of diarrhea about 2 to 3 days ago but none since then.  Noted to have had a bloody stool per daughter a few days ago however no ongoing bloody stools.  Patient noted on evaluation to have a Cheyne-Stokes respiration with bouts of significant apnea.   ED Course: Patient seen in the ED, noted to have some bouts of Cheyne-Stokes respiration.      Assessment & Plan:  Principal Problem:   Acute respiratory failure with hypoxia (HCC) Active Problems:   Parkinson disease   Chronic atrial fibrillation (HCC)   Coronary atherosclerosis of native coronary artery   Pure hypercholesterolemia   HTN (hypertension)   Tachycardia-bradycardia (HCC)   Presence of permanent cardiac pacemaker   Acute CVA  (cerebrovascular accident) ([place])   Hypoxia   Cheyne-Stokes respiration   Dehydration   Hypernatremia   History of COVID-19: Recent infection diagnosed 05/17/2022 per family   Abnormal breathing   Infection due to human metapneumovirus (hMPV)   COVID-19 with multiple comorbidities   Dyspnea   Pain   Agitation   High risk medication use   Goals of care, counseling/discussion   Palliative care encounter    Assessment and Plan: * Acute respiratory failure with hypoxia ([place]) -Patient presented with acute respiratory failure with hypoxia, requiring O2, noted on initial presentation to the ED to be tachycardic, tachypneic with respiratory rates as high as 35, noted to be in A-fib heart rate as high as 170s. -Patient with some Cheyne-Stokes respiration noted. -??  Etiology.  Concern for also possible aspiration pneumonia with recent history of CVA, in the setting of recent COVID-19 infection.  Concern for volume overload. -Head CT done with no acute infarct noted, patient noted to have recent CVA. -Concern for CHF as BNP elevated, chest x-ray with some cardiomegaly. -Patient however clinically dry on examination. -Unable to tell if any acute infiltrate.  Patient with a leukocytosis however recently been on steroids. -Patient seen by PCCM who feel NT suction may be uncomfortable for the patient at this time and holding off. -VBG done with a pH of 7.41, pCO2 of 36, pO2 of 63, bicarb of 29. -Likely not a candidate for BiPAP at this time as concern patient unable to handle his own secretions. -Patient clinically dry on examination, elevated sodium level at  151 on admission with D5W down to 146 on 05/26/2022., recent poor oral intake in the setting of recent COVID-19 PCR pneumonia 8 days prior to admission. -Respiratory viral panel positive for metapneumovirus.  Troponin elevated by flattened. -Procalcitonin negative. -CRP 6.1>>> 6.3. -Chest x-ray done the morning of 05/26/2022, with worsening CHF  with bilateral effusions. -2D echo done with EF of 55 to 60%, NWMA, severely dilated left atrial size and severely dilated right atrial size.  Moderate to severe MVR that appears to have progressed significantly compared to prior 2D echo.  -Status post Robinul IV x 1 with some improvement with secretions on exam. -Continue IV Unasyn and discontinue after today's doses to complete a 3-day course of treatment. -Continue IV Lasix for 2-3 more doses. -Patient noted to have increased work of breathing the morning of 05/27/2022, potential for air hunger, noted per NP that the floor Dilaudid has been ineffective and patient given IV morphine with some improvement. -Placed on morphine 1 mg every 2 hours as needed for increased work of breathing, shortness of breath. -Patient with a poor prognosis, palliative care consulted to discuss with family and decision has been made to transition to comfort measures. -Patient now on a morphine drip. -Likely in-hospital death.  Infection due to human metapneumovirus (hMPV) -Noted on respiratory viral panel. -Supportive care. -Patient now comfort care measures.  History of COVID-19: Recent infection diagnosed 05/17/2022 per family -Per daughter patient recently diagnosed with COVID-19 infection 05/17/2022 and treated for pneumonia with antibiotics at that time.  Patient noted to also have been on steroids as well. -Patient seen by palliative care and decision made to transition to full comfort measures.  Hypernatremia -Likely secondary to hypovolemic hypernatremia. -Patient recently hospitalized for acute CVA and discharged on 05/09/2022. -Patient also noted to be recently diagnosed with COVID-19 per daughter on 05/17/2022 and treated for pneumonia with antibiotics. -Patient noted with poor oral intake. -Status post half-normal saline 1 L bolus x 1.   -Patient also given D5W with some improvement with sodium down to 146 on 05/26/2022.  -Patient seen by  palliative care and decision made to transition to full comfort measures.   Dehydration -Half-normal saline 1 L bolus x 1 given on admission. -Was on IV fluids of D5W at 75 cc an hour which have been discontinued.   -Patient seen by palliative care decision made to transition to full comfort measures.  Cheyne-Stokes respiration -??  Etiology -Head CT negative for any acute CVA. -See acute respiratory failure with hypoxia above.  Acute CVA (cerebrovascular accident) ([place]) -Noted to have history of recent CVA in the setting of A-fib felt likely embolic in nature. -Patient discharged on Eliquis. -Anticoagulation initially held due to respiratory issues and patient has been placed on full dose Lovenox, statin and Zetia was held.   -Patient seen by palliative care and decision made to transition to full comfort measures and as such nonessential medications have been discontinued per palliative care.   Tachycardia-bradycardia ([place]) -See chronic A-fib above.  HTN (hypertension) -IV Lopressor.  Pure hypercholesterolemia -Patient now comfort measures.  Coronary atherosclerosis of native coronary artery -IV Lopressor. -Patient seen by palliative care and decision made to transition to full comfort measures.    Chronic atrial fibrillation (HCC) -Patient with history of tachybradycardia syndrome status post PPM. -Continue IV Lopressor every 8 hours.  -2D echo done with EF of 55 to 60%, NWMA, severely dilated left atrial size and severely dilated right atrial size.  Moderate to severe MVR that appears  to have progressed significantly compared to prior 2D echo . -Patient noted to have been on Eliquis prior to admission, on admission placed on full dose Lovenox which has been discontinued as patient seen by palliative care and decision made to transition to full comfort measures.   Parkinson disease -Continue to hold home regimen of medications. -Patient seen by palliative care and decision  made to transition to comfort measures.         DVT prophylaxis: Comfort care Code Status: DNR Family Communication: No family at bedside. Disposition: Patient now comfort care.  Likely in-hospital death.  Status is: Inpatient Remains inpatient appropriate because: Severity of illness   Consultants:  Palliative care: Dr. Vinetta Bergamo 05/26/2022  Procedures:  CT head 05/19/2022 Chest x-ray 06/03/2022, 05/26/2022 2D echo 05/26/2022  Antimicrobials:  IV Unasyn 05/26/2022>>> 05/28/2022   Subjective: Patient sitting up in bed, minimally responsive.  Shallow breaths.  On morphine drip.    Objective: Vitals:   05/28/22 0500 05/28/22 0700 05/28/22 0800 05/28/22 0854  BP:      Pulse: 84 77 80 60  Resp: '16 10 13 '$ (!) 28  Temp:      TempSrc:      SpO2: 98% 99% 99% 99%  Weight: 66.6 kg     Height:        Intake/Output Summary (Last 24 hours) at 05/28/2022 1050 Last data filed at 05/28/2022 0900 Gross per 24 hour  Intake 446.96 ml  Output 2125 ml  Net -1678.04 ml   Filed Weights   05/26/22 0400 05/27/22 0500 05/28/22 0500  Weight: 68.4 kg 68.3 kg 66.6 kg    Examination:  General exam: Minimally responsive.  Dry mucous membranes. Respiratory system: Decreased rhonchorous breath sounds anterior lung fields.  Shallow breaths.  Cardiovascular system: Irregularly irregular.  3/6 SEM.  No significant lower extremity edema.   Gastrointestinal system: Abdomen is soft, nontender, nondistended, positive bowel sounds.  No rebound.  No guarding. Central nervous system: Minimally responsive.  Extremities: Symmetric 5 x 5 power. Skin: No rashes, lesions or ulcers Psychiatry: Judgement and insight unable to assess.  Mood and affect unable to assess.     Data Reviewed:   CBC: Recent Labs  Lab 06/12/2022 1520 05/26/22 0249  WBC 12.0* 11.2*  NEUTROABS 10.6* 10.7*  HGB 12.2* 11.7*  HCT 40.0 39.2  MCV 103.6* 105.4*  PLT 236 010    Basic Metabolic Panel: Recent Labs  Lab  06/06/2022 1600 05/26/22 0249  NA 151* 146*  K 4.8 4.4  CL 122* 118*  CO2 21* 18*  GLUCOSE 112* 178*  BUN 23 24*  CREATININE 1.21 1.06  CALCIUM 8.6* 8.4*  MG  --  2.3    GFR: Estimated Creatinine Clearance: 31.9 mL/min (by C-G formula based on SCr of 1.06 mg/dL).  Liver Function Tests: Recent Labs  Lab 05/26/2022 1600 05/26/22 0249  AST 36 25  ALT 6 11  ALKPHOS 68 69  BILITOT 1.1 0.8  PROT 6.2* 6.1*  ALBUMIN 2.7* 2.7*    CBG: Recent Labs  Lab 05/26/22 0748  GLUCAP 149*     Recent Results (from the past 240 hour(s))  Blood culture (routine x 2)     Status: None (Preliminary result)   Collection Time: 05/23/2022  3:20 PM   Specimen: BLOOD RIGHT HAND  Result Value Ref Range Status   Specimen Description   Final    BLOOD RIGHT HAND Performed at [facility], [place] [address] [place], [place] [address]  Special Requests   Final    BOTTLES DRAWN AEROBIC AND ANAEROBIC Blood Culture results may not be optimal due to an inadequate volume of blood received in culture bottles Performed at [facility] [address], [place], [place] [address]    Culture   Final    NO GROWTH 3 DAYS Performed at [facility], [place] [address] [place], [place] [address]    Report Status PENDING  Incomplete  Respiratory (~20 pathogens) panel by PCR     Status: Abnormal   Collection Time: 05/30/2022  8:04 PM   Specimen: Nasopharyngeal Swab; Respiratory  Result Value Ref Range Status   Adenovirus NOT DETECTED NOT DETECTED Final   Coronavirus 229E NOT DETECTED NOT DETECTED Final    Comment: (NOTE) The Coronavirus on the Respiratory Panel, DOES NOT test for the novel  Coronavirus (2019 nCoV)    Coronavirus HKU1 NOT DETECTED NOT DETECTED Final   Coronavirus NL63 NOT DETECTED NOT DETECTED Final   Coronavirus OC43 NOT DETECTED NOT DETECTED Final   Metapneumovirus DETECTED (A) NOT DETECTED Final   Rhinovirus / Enterovirus NOT DETECTED NOT DETECTED Final    Influenza A NOT DETECTED NOT DETECTED Final   Influenza B NOT DETECTED NOT DETECTED Final   Parainfluenza Virus 1 NOT DETECTED NOT DETECTED Final   Parainfluenza Virus 2 NOT DETECTED NOT DETECTED Final   Parainfluenza Virus 3 NOT DETECTED NOT DETECTED Final   Parainfluenza Virus 4 NOT DETECTED NOT DETECTED Final   Respiratory Syncytial Virus NOT DETECTED NOT DETECTED Final   Bordetella pertussis NOT DETECTED NOT DETECTED Final   Bordetella Parapertussis NOT DETECTED NOT DETECTED Final   Chlamydophila pneumoniae NOT DETECTED NOT DETECTED Final   Mycoplasma pneumoniae NOT DETECTED NOT DETECTED Final    Comment: Performed at [facility], [address], [place], [place] [address]  MRSA Next Gen by PCR, Nasal     Status: None   Collection Time: 06/09/2022 11:34 PM   Specimen: Nasal Mucosa; Nasal Swab  Result Value Ref Range Status   MRSA by PCR Next Gen NOT DETECTED NOT DETECTED Final    Comment: (NOTE) The GeneXpert MRSA Assay (FDA approved for NASAL specimens only), is one component of a comprehensive MRSA colonization surveillance program. It is not intended to diagnose MRSA infection nor to guide or monitor treatment for MRSA infections. Test performance is not FDA approved in patients less than 47 years old. Performed at [facility], [place] [address], [place], [place] [address]   Blood culture (routine x 2)     Status: None (Preliminary result)   Collection Time: 05/26/22  2:51 AM   Specimen: BLOOD  Result Value Ref Range Status   Specimen Description   Final    BLOOD BLOOD RIGHT HAND IN PEDIATRIC BOTTLE Performed at [facility] [address] [place], [place] [address]    Special Requests   Final    NONE Performed at [facility], [place] [address], [place], [place] [address]    Culture   Final    NO GROWTH 2 DAYS Performed at [facility] [address], [place], [place] [address]    Report Status PENDING   Incomplete         Radiology Studies: No results found.      Scheduled Meds:  Chlorhexidine Gluconate Cloth  6 each Topical Daily   furosemide  20 mg Intravenous Q12H   metoprolol tartrate  2.5 mg Intravenous  Q8H   mouth rinse  15 mL Mouth Rinse 4 times per day   Continuous Infusions:  sodium chloride 10 mL/hr at 05/28/22 0900   morphine 1 mg/hr (05/28/22 0900)     LOS: 3 days    Time spent: 35 minutes    Irine Seal, MD [facility]   To contact the attending provider between 7A-7P or the covering provider during after hours 7P-7A, please log into the web site [email or web address] and access using universal [place] password for that web site. If you do not have the password, please call the hospital operator.  05/28/2022, 10:50 AM

## 2022-05-28 NOTE — Progress Notes (Signed)
Daily Progress Note   Patient Name: Jerry Wood       Date: 05/28/2022 DOB: 03/22/1922  Age: 88 y.o. MRN#: [id number] Attending Physician: Eugenie Filler, MD Primary Care Physician: Johna Roles, [place] Admit Date: 06/13/2022 Length of Stay: 3 days  Reason for Consultation/Follow-up: Establishing goals of care and Terminal Care  Subjective:   CC: Patient lethargic this morning and work of breathing improved though can be increased at times.  Following up regarding complex medical decision making and symptom management at the end of life.  Subjective: Extensive review of EMR prior to seeing patient.  At time of EMR review over the past 24 hours, patient has continued to receive milligram continuous basal dosing at 1 mg/h.  In addition to basal dosing patient has received glycopyrrolate 0.2 mg x 2 and an additional 3 mg of IV morphine for breakthrough symptom management.  Discussed care with bedside RN prior to seeing patient.  Presented to bedside to check on patient.  Both of patient's daughters were present at bedside.  Able to discuss patient's continued symptom management.  Patient more lethargic this morning.  There work of breathing controlled at times still has increased work of breathing noted during her visit.  Discussed based on patient's symptom burden, will increase basal continuous dose of morphine to which family agrees with this plan.  Discussed potential transfer to inpatient hospice.  Was very honest with family that due to patient's current status, he very possibly could pass away and transfer.  At this time family hoping patient can remain in the hospital for continued comfort focused care.  Agreed with palliative medicine team continuing to follow to just medications for symptom management.  Daughters greatly appreciate support from palliative medicine team.  Review of Systems Patient has increased work of breathing at times.  Patient more lethargic  today. Objective:   Vital Signs:  BP 102/66   Pulse 60   Temp 97.6 F (36.4 C) (Axillary)   Resp (!) 28   Ht '5\' 1"'$  (1.549 m)   Wt 66.6 kg   SpO2 99%   BMI 27.74 kg/m   Physical Exam: General: Increased work of breathing , ill-appearing, lethargic Eyes: No drainage noted HENT: Dry mucous membranes, some mandibular breathing noted today Cardiovascular: RRR Respiratory: Increased work of breathing seen at times with assessor muscle use Abdomen: not distended Neuro: Lethargic  Imaging: I personally reviewed recent imaging.   Assessment & Plan:   Assessment: Patient is a 88 year old male with a past medical history of Parkinson's disease, tachybradycardia cardia syndrome status post PPM, CAD status post PCI, CKD, wheelchair-bound status, CVA in 05/07/2022 with residual left sided weakness, and COVID diagnosed on 05/17/2022 at outside facility who was admitted on 06/17/2022 for management of decreased appetite, decreased oral intake, worsening confusion and speech.  Since being admitted, patient diagnosed with acute respiratory failure with hypoxia possibly type factorial reasons; patient seen to have [facility] respiration with periods of apnea.  Imaging has shown worsening of pulmonary edema concerning for CHF.  Patient has received management for possible infection and dehydration.  Palliative medicine team consulted to assist with complex medical decision making.   Recommendations/Plan: # Complex medical decision making/goals of care:  - Patient was transition to comfort focused care on 05/26/2022.  Continuing with comfort focused care at this time.  Discussed care with daughters at bedside as described above in HPI.  Very possible patient could pass away in transport if sought inpatient hospice referral.  Patient's family would prefer he remains in the hospital at this time for symptom management.  Should patient continue to remain in the hospital for a prolonged amount of time,  family absolutely agrees inpatient hospice referral should be made.  At this time continuing to monitor and adjust symptom management medications as not stable for transfer.  -  Code Status: DNR Prognosis: Hours to days  # Symptom management:   -Pain/Dyspnea, acute in the setting of end-of-life care           Patient's symptom of dyspnea improved though not controlled at this time..  Continue to adjust medications to support appropriate symptom management at the end of life.   -Increase continuous basal morphine infusion to '2mg'$ /h.  Has  RN bolus to 1-2 mg every hour as needed for breakthrough management.  Please continue to adjust both basal and bolus doses based on patient's symptom burden.                 -Anxiety/agitation, in the setting of end-of-life care                               -Continue IV Ativan 0.5 mg every 4 hours as needed. Continue to adjust based on patient's symptom burden.                                 -And also has IV Haldol 0.5 mg every 4 hours as needed. Continue to adjust based on patient's symptom burden.                   -Secretions, in the setting of end-of-life care                               -Continue IV glycopyrrolate 0.2 mg every 4 hours as needed.  # Psychosocial Support:  - Support System: 2 daughters (only children)   # Discharge Planning: Continuing with comfort focused care at this time.  Patient does not appear stable for transfer to inpatient hospice facility at this time.  Family wanting to continue with comfort focused care in the hospital.  Should patient remain with [place] for a longer amount of time, daughter's very open to discussing inpatient hospice referral if needed.  Discussed with: Daughters, bedside RN  Thank you for allowing the palliative care team to participate in the care Jerry Wood.  Chelsea Aus, DO Palliative Care Provider PMT # [phone]  If patient remains symptomatic despite maximum doses, please call PMT at  [phone] between 0700 and 1900. Outside of these hours, please call attending, as PMT does not have night coverage.

## 2022-05-29 DIAGNOSIS — Z515 Encounter for palliative care: Secondary | ICD-10-CM | POA: Diagnosis not present

## 2022-05-29 DIAGNOSIS — E870 Hyperosmolality and hypernatremia: Secondary | ICD-10-CM | POA: Diagnosis not present

## 2022-05-29 DIAGNOSIS — I639 Cerebral infarction, unspecified: Secondary | ICD-10-CM | POA: Diagnosis not present

## 2022-05-29 DIAGNOSIS — E860 Dehydration: Secondary | ICD-10-CM | POA: Diagnosis not present

## 2022-05-29 DIAGNOSIS — R531 Weakness: Secondary | ICD-10-CM | POA: Diagnosis not present

## 2022-05-29 DIAGNOSIS — J9601 Acute respiratory failure with hypoxia: Secondary | ICD-10-CM | POA: Diagnosis not present

## 2022-05-29 DIAGNOSIS — Z7189 Other specified counseling: Secondary | ICD-10-CM | POA: Diagnosis not present

## 2022-05-29 NOTE — Progress Notes (Signed)
Daily Progress Note   Patient Name: LANE ELAND       Date: 05/29/2022 DOB: 02-15-1922  Age: 88 y.o. MRN#: [id number] Attending Physician: Eugenie Filler, MD Primary Care Physician: Johna Roles, [place] Admit Date: 05/23/2022 Length of Stay: 4 days  Reason for Consultation/Follow-up: Establishing goals of care and Terminal Care  Subjective:   CC: Patient is not responsive, open mouth breathing going on, 2 daughters at bedside.   Following up regarding complex medical decision making and symptom management at the end of life.  Subjective: Chart reviewed, patient seen and examined remains comfortable on 2 mg/h morphine infusion.    Presented to bedside to check on patient.  Both of patient's daughters were present at bedside.  We discussed about end-of-life signs and symptoms.    Review of Systems Patient unresponsive, shallow breathing   Objective:   Vital Signs:  BP 102/66   Pulse 94   Temp 97.6 F (36.4 C) (Axillary)   Resp 16   Ht '5\' 1"'$  (1.549 m)   Wt 66.5 kg   SpO2 95%   BMI 27.70 kg/m   Physical Exam: General: Unresponsive, shallow breathing  Eyes: No drainage noted HENT: Dry mucous membranes, some mandibular breathing noted  Cardiovascular: RRR Respiratory: Shallow breath sounds, possibly also with some apneic spells Abdomen: not distended Neuro: Lethargic Has some urine in the Foley catheter Imaging: I personally reviewed recent imaging.   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  Since being admitted, patient diagnosed with acute respiratory failure  with hypoxia possibly type factorial reasons; patient seen to have [facility] respiration with periods of apnea.  Imaging has shown worsening of pulmonary edema concerning for CHF.  Patient has received management for possible infection and dehydration.  Palliative medicine team consulted to assist with complex medical decision making.   Recommendations/Plan: # Complex medical decision making/goals of care:  - Patient was transition to comfort focused care on 05/26/2022.  Continuing with comfort focused care at this time.  Discussed care with daughters at bedside as described above in HPI.  Very possible patient could pass away in transport if sought inpatient hospice referral.  Patient's family would prefer he remains in the hospital at this time for symptom management.     At this time continuing to monitor and adjust symptom management medications as not stable for transfer.  -  Code Status: DNR Prognosis: Hours to days  appears markedly limited, possibly not more than 1-2 days at this time in my opinion based on patient's clinical presentation. # Symptom management:   -Pain/Dyspnea, acute in the setting of end-of-life care           Continue current pain management regimen.                   -Anxiety/agitation, in the setting of end-of-life care                               -Continue IV Ativan 0.5 mg every 4 hours as needed. Continue to adjust based on patient's symptom burden.                                 -  And also has IV Haldol 0.5 mg every 4 hours as needed. Continue to adjust based on patient's symptom burden.                   -Secretions, in the setting of end-of-life care                               -Continue IV glycopyrrolate 0.2 mg every 4 hours as needed.  # Psychosocial Support:  - Support System: 2 daughters (only children)   # Discharge Planning: Continuing with comfort focused care at this time.  Patient does not appear stable for transfer to inpatient hospice  facility at this time.  Family wanting to continue with comfort focused care in the hospital.    Discussed with: Daughters,   Thank you for allowing the palliative care team to participate in the care [name]. MOD MDM Loistine Chance MD Palliative Care Provider PMT # [phone]  If patient remains symptomatic despite maximum doses, please call PMT at [phone] between 0700 and 1900. Outside of these hours, please call attending, as PMT does not have night coverage.

## 2022-05-29 NOTE — Progress Notes (Signed)
PROGRESS NOTE    Jerry Wood  EZM:[id number] DOB: Feb 25, 1922 DOA: 05/24/2022 PCP: Johna Roles, PA    Chief Complaint  Patient presents with   Shortness of Breath    Brief Narrative:  Jerry Wood is a 88 y.o. male with medical history significant of Parkinson's disease, tachybradycardia syndrome status post PPM, CAD status post PCI, CKD, wheelchair-bound, recent hospitalization for acute CVA 05/09/2022 with left facial weakness left-sided weakness, discharged to SNF.  Noted to have been diagnosed with COVID-19 8 days prior to admission 05/17/2022 per daughter is at bedside and treated with antibiotics for COVID-pneumonia per family.  It was noted that over the past 3 to 4 days patient's condition has deteriorated, patient with decreased appetite, decreased oral intake, noted to have worsening baseline from dysarthric speech per family, some bouts of confusion and noted at facility to have sodium of 151.  Family denies any recent fevers or chills.  Denies any chest pain.  Per family patient hard of hearing and did endorse some shortness of breath whereby stated needed to sit up to breathe better over the past few days.  Per family no significant abdominal pain, no constipation.  Had a bout of diarrhea about 2 to 3 days ago but none since then.  Noted to have had a bloody stool per daughter a few days ago however no ongoing bloody stools.  Patient noted on evaluation to have a Cheyne-Stokes respiration with bouts of significant apnea.   ED Course: Patient seen in the ED, noted to have some bouts of Cheyne-Stokes respiration.      Assessment & Plan:  Principal Problem:   Acute respiratory failure with hypoxia (HCC) Active Problems:   Parkinson disease   Chronic atrial fibrillation (HCC)   Coronary atherosclerosis of native coronary artery   Pure hypercholesterolemia   HTN (hypertension)   Tachycardia-bradycardia (HCC)   Presence of permanent cardiac pacemaker   Acute CVA  (cerebrovascular accident) ([place])   Hypoxia   Cheyne-Stokes respiration   Dehydration   Hypernatremia   History of COVID-19: Recent infection diagnosed 05/17/2022 per family   Abnormal breathing   Infection due to human metapneumovirus (hMPV)   COVID-19 with multiple comorbidities   Dyspnea   Pain   Agitation   High risk medication use   Goals of care, counseling/discussion   Palliative care encounter    Assessment and Plan: * Acute respiratory failure with hypoxia ([place]) -Patient presented with acute respiratory failure with hypoxia, requiring O2, noted on initial presentation to the ED to be tachycardic, tachypneic with respiratory rates as high as 35, noted to be in A-fib heart rate as high as 170s. -Patient with some Cheyne-Stokes respiration noted. -??  Etiology.  Concern for also possible aspiration pneumonia with recent history of CVA, in the setting of recent COVID-19 infection.  Concern for volume overload. -Head CT done with no acute infarct noted, patient noted to have recent CVA. -Concern for CHF as BNP elevated, chest x-ray with some cardiomegaly. -Patient however clinically dry on examination. -Unable to tell if any acute infiltrate.  Patient with a leukocytosis however recently been on steroids. -Patient seen by PCCM who feel NT suction may be uncomfortable for the patient at this time and holding off. -VBG done with a pH of 7.41, pCO2 of 36, pO2 of 63, bicarb of 29. -Likely not a candidate for BiPAP at this time as concern patient unable to handle his own secretions. -Patient clinically dry on examination, elevated sodium level at  151 on admission with D5W down to 146 on 05/26/2022., recent poor oral intake in the setting of recent COVID-19 PCR pneumonia 8 days prior to admission. -Respiratory viral panel positive for metapneumovirus.  Troponin elevated by flattened. -Procalcitonin negative. -CRP 6.1>>> 6.3. -Chest x-ray done the morning of 05/26/2022, with worsening CHF  with bilateral effusions. -2D echo done with EF of 55 to 60%, NWMA, severely dilated left atrial size and severely dilated right atrial size.  Moderate to severe MVR that appears to have progressed significantly compared to prior 2D echo.  -Status post Robinul IV x 1 with some improvement with secretions on exam. -Status post 3 days IV Unasyn.  -Status post IV Lasix x 4 doses.  -Patient noted to have increased work of breathing the morning of 05/27/2022, potential for air hunger, noted per NP that the floor Dilaudid has been ineffective and patient given IV morphine with some improvement. -Placed on morphine 1 mg every 2 hours as needed for increased work of breathing, shortness of breath. -Patient with a poor prognosis, palliative care consulted to discuss with family and decision has been made to transition to comfort measures. -Patient now on a morphine drip and dose uptitrated per palliative care.. -Likely in-hospital death.  Infection due to human metapneumovirus (hMPV) -Noted on respiratory viral panel. -Supportive care. -Patient now comfort care measures.  History of COVID-19: Recent infection diagnosed 05/17/2022 per family -Per daughter patient recently diagnosed with COVID-19 infection 05/17/2022 and treated for pneumonia with antibiotics at that time.  Patient noted to also have been on steroids as well. -Patient seen by palliative care and decision made to transition to full comfort measures.  Hypernatremia -Likely secondary to hypovolemic hypernatremia. -Patient recently hospitalized for acute CVA and discharged on 05/09/2022. -Patient also noted to be recently diagnosed with COVID-19 per daughter on 05/17/2022 and treated for pneumonia with antibiotics. -Patient noted with poor oral intake. -Status post half-normal saline 1 L bolus x 1.   -Patient also given D5W with some improvement with sodium down to 146 on 05/26/2022.  -Patient seen by palliative care and decision made to  transition to full comfort measures.   Dehydration -Half-normal saline 1 L bolus x 1 given on admission. -Was on IV fluids of D5W at 75 cc an hour which have been discontinued.   -Patient seen by palliative care decision made to transition to full comfort measures.  Cheyne-Stokes respiration -??  Etiology -Head CT negative for any acute CVA. -See acute respiratory failure with hypoxia above.  Acute CVA (cerebrovascular accident) ([place]) -Noted to have history of recent CVA in the setting of A-fib felt likely embolic in nature. -Patient discharged on Eliquis. -Anticoagulation initially held due to respiratory issues and patient has been placed on full dose Lovenox, statin and Zetia was held.   -Patient seen by palliative care and decision made to transition to full comfort measures and as such nonessential medications have been discontinued per palliative care.   Tachycardia-bradycardia ([place]) -See chronic A-fib above.  HTN (hypertension) -IV Lopressor.  Pure hypercholesterolemia -Patient now comfort measures.  Coronary atherosclerosis of native coronary artery -IV Lopressor. -Patient seen by palliative care and decision made to transition to full comfort measures.    Chronic atrial fibrillation (HCC) -Patient with history of tachybradycardia syndrome status post PPM. -Continue IV Lopressor every 8 hours.  -2D echo done with EF of 55 to 60%, NWMA, severely dilated left atrial size and severely dilated right atrial size.  Moderate to severe MVR that appears to  have progressed significantly compared to prior 2D echo . -Patient noted to have been on Eliquis prior to admission, on admission placed on full dose Lovenox which has been discontinued as patient seen by palliative care and decision made to transition to full comfort measures.   Parkinson disease -Continue to hold home regimen of medications. -Patient seen by palliative care and decision made to transition to comfort  measures.         DVT prophylaxis: Comfort care Code Status: DNR Family Communication: No family at bedside. Disposition: Patient now comfort care.  Likely in-hospital death.  Status is: Inpatient Remains inpatient appropriate because: Severity of illness   Consultants:  Palliative care: Dr. Vinetta Bergamo 05/26/2022  Procedures:  CT head 05/21/2022 Chest x-ray 06/05/2022, 05/26/2022 2D echo 05/26/2022  Antimicrobials:  IV Unasyn 05/26/2022>>> 05/28/2022   Subjective: Laying in bed, mouth open.  Minimally responsive.  Shallow/agonal breaths.  On morphine drip.    Objective: Vitals:   05/29/22 1000 05/29/22 1100 05/29/22 1200 05/29/22 1300  BP:      Pulse: 93 90 95 94  Resp: (!) '21 20 18 16  '$ Temp:      TempSrc:      SpO2: 94% 94% 95% 95%  Weight:      Height:        Intake/Output Summary (Last 24 hours) at 05/29/2022 1504 Last data filed at 05/29/2022 1300 Gross per 24 hour  Intake 264.87 ml  Output 600 ml  Net -335.13 ml   Filed Weights   05/27/22 0500 05/28/22 0500 05/29/22 0500  Weight: 68.3 kg 66.6 kg 66.5 kg    Examination:  General exam: Minimally responsive.  Mouth open.  Shallow breaths.  Respiratory system: Decreased rhonchorous breath sounds.  Shallow numbers. Cardiovascular system: Irregularly irregular.  3/6 SEM.  No significant lower extremity edema.  Gastrointestinal system: Abdomen is soft, nontender, nondistended, positive bowel sounds.  No rebound.  No guarding. Central nervous system: Minimally responsive.  Extremities: Symmetric 5 x 5 power. Skin: No rashes, lesions or ulcers Psychiatry: Judgement and insight unable to assess.  Mood and affect unable to assess.     Data Reviewed:   CBC: Recent Labs  Lab 06/03/2022 1520 05/26/22 0249  WBC 12.0* 11.2*  NEUTROABS 10.6* 10.7*  HGB 12.2* 11.7*  HCT 40.0 39.2  MCV 103.6* 105.4*  PLT 236 902    Basic Metabolic Panel: Recent Labs  Lab 06/10/2022 1600 05/26/22 0249  NA 151* 146*  K 4.8  4.4  CL 122* 118*  CO2 21* 18*  GLUCOSE 112* 178*  BUN 23 24*  CREATININE 1.21 1.06  CALCIUM 8.6* 8.4*  MG  --  2.3    GFR: Estimated Creatinine Clearance: 31.9 mL/min (by C-G formula based on SCr of 1.06 mg/dL).  Liver Function Tests: Recent Labs  Lab 06/11/2022 1600 05/26/22 0249  AST 36 25  ALT 6 11  ALKPHOS 68 69  BILITOT 1.1 0.8  PROT 6.2* 6.1*  ALBUMIN 2.7* 2.7*    CBG: Recent Labs  Lab 05/26/22 0748  GLUCAP 149*     Recent Results (from the past 240 hour(s))  Blood culture (routine x 2)     Status: None (Preliminary result)   Collection Time: 05/19/2022  3:20 PM   Specimen: BLOOD RIGHT HAND  Result Value Ref Range Status   Specimen Description   Final    BLOOD RIGHT HAND Performed at [facility], [place] [address], [place], [place] [address]    Special Requests  Final    BOTTLES DRAWN AEROBIC AND ANAEROBIC Blood Culture results may not be optimal due to an inadequate volume of blood received in culture bottles Performed at [facility], [place] [address] [place], [place] [address]    Culture   Final    NO GROWTH 3 DAYS Performed at [facility], [place] [address] [place], [place] [address]    Report Status PENDING  Incomplete  Respiratory (~20 pathogens) panel by PCR     Status: Abnormal   Collection Time: 05/31/2022  8:04 PM   Specimen: Nasopharyngeal Swab; Respiratory  Result Value Ref Range Status   Adenovirus NOT DETECTED NOT DETECTED Final   Coronavirus 229E NOT DETECTED NOT DETECTED Final    Comment: (NOTE) The Coronavirus on the Respiratory Panel, DOES NOT test for the novel  Coronavirus (2019 nCoV)    Coronavirus HKU1 NOT DETECTED NOT DETECTED Final   Coronavirus NL63 NOT DETECTED NOT DETECTED Final   Coronavirus OC43 NOT DETECTED NOT DETECTED Final   Metapneumovirus DETECTED (A) NOT DETECTED Final   Rhinovirus / Enterovirus NOT DETECTED NOT DETECTED Final   Influenza A NOT DETECTED NOT DETECTED Final   Influenza B  NOT DETECTED NOT DETECTED Final   Parainfluenza Virus 1 NOT DETECTED NOT DETECTED Final   Parainfluenza Virus 2 NOT DETECTED NOT DETECTED Final   Parainfluenza Virus 3 NOT DETECTED NOT DETECTED Final   Parainfluenza Virus 4 NOT DETECTED NOT DETECTED Final   Respiratory Syncytial Virus NOT DETECTED NOT DETECTED Final   Bordetella pertussis NOT DETECTED NOT DETECTED Final   Bordetella Parapertussis NOT DETECTED NOT DETECTED Final   Chlamydophila pneumoniae NOT DETECTED NOT DETECTED Final   Mycoplasma pneumoniae NOT DETECTED NOT DETECTED Final    Comment: Performed at [facility], [address], [place], [place] [address]  MRSA Next Gen by PCR, Nasal     Status: None   Collection Time: 05/24/2022 11:34 PM   Specimen: Nasal Mucosa; Nasal Swab  Result Value Ref Range Status   MRSA by PCR Next Gen NOT DETECTED NOT DETECTED Final    Comment: (NOTE) The GeneXpert MRSA Assay (FDA approved for NASAL specimens only), is one component of a comprehensive MRSA colonization surveillance program. It is not intended to diagnose MRSA infection nor to guide or monitor treatment for MRSA infections. Test performance is not FDA approved in patients less than 35 years old. Performed at [facility], [place] [address], [place], [place] [address]   Blood culture (routine x 2)     Status: None (Preliminary result)   Collection Time: 05/26/22  2:51 AM   Specimen: BLOOD  Result Value Ref Range Status   Specimen Description   Final    BLOOD BLOOD RIGHT HAND IN PEDIATRIC BOTTLE Performed at [facility] [address] [place], [place] [address]    Special Requests   Final    NONE Performed at [facility], [place] [address], [place], [place] [address]    Culture   Final    NO GROWTH 2 DAYS Performed at [place] [address] [place], [place] [address]    Report Status PENDING  Incomplete         Radiology Studies: No results  found.      Scheduled Meds:  Chlorhexidine Gluconate Cloth  6 each Topical Daily   mouth rinse  15 mL Mouth Rinse 4 times per day   Continuous Infusions:  sodium chloride  10 mL/hr at 05/29/22 1300   morphine 2 mg/hr (05/29/22 1310)     LOS: 4 days    Time spent: 35 minutes    Irine Seal, MD [facility]   To contact the attending provider between 7A-7P or the covering provider during after hours 7P-7A, please log into the web site [email or web address] and access using universal [place] password for that web site. If you do not have the password, please call the hospital operator.  05/29/2022, 3:04 PM

## 2022-05-30 DIAGNOSIS — J9601 Acute respiratory failure with hypoxia: Secondary | ICD-10-CM | POA: Diagnosis not present

## 2022-05-30 DIAGNOSIS — Z515 Encounter for palliative care: Secondary | ICD-10-CM | POA: Diagnosis not present

## 2022-05-30 LAB — CULTURE, BLOOD (ROUTINE X 2): Culture: NO GROWTH

## 2022-05-30 NOTE — Progress Notes (Signed)
Daily Progress Note   Patient Name: Jerry Wood       Date: 05/30/2022 DOB: June 21, 1921  Age: 88 y.o. MRN#: [id number] Attending Physician: Jerry Croak, MD Primary Care Physician: Jerry Roles, PA Admit Date: 05/30/2022 Length of Stay: 5 days  Reason for Consultation/Follow-up: Establishing goals of care and Terminal Care  Subjective:   CC: Patient is not responsive, open mouth breathing going on,  daughter at bedside.   Following up regarding complex medical decision making and symptom management at the end of life.  Subjective: Chart reviewed, patient seen and examined remains comfortable on 2 mg/h morphine infusion.    Presented to bedside to check on patient.  Both of patient's daughters were present at bedside.  We discussed about end-of-life signs and symptoms.    Review of Systems Patient unresponsive, shallow breathing   Objective:   Vital Signs:  BP 102/66   Pulse (!) 103   Temp 97.6 F (36.4 C) (Axillary)   Resp (!) 21   Ht '5\' 1"'$  (1.549 m)   Wt 66.5 kg   SpO2 97%   BMI 27.70 kg/m   Physical Exam: General: Unresponsive, shallow breathing  Eyes: No drainage noted HENT: Dry mucous membranes, some mandibular breathing noted  Cardiovascular: RRR Respiratory: Shallow breath sounds, possibly also with some apneic spells Abdomen: not distended Neuro: Lethargic Has some urine in the Foley catheter Imaging: I personally reviewed recent imaging.   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  Since being admitted, patient diagnosed with acute respiratory failure  with hypoxia possibly type factorial reasons; patient seen to have [facility] respiration with periods of apnea.  Imaging has shown worsening of pulmonary edema concerning for CHF.  Patient has received management for possible infection and dehydration.  Palliative medicine team consulted to assist with complex medical decision making.   Recommendations/Plan: # Complex medical decision making/goals of care:  - Patient was transition to comfort focused care on 05/26/2022.  Continuing with comfort focused care at this time.  Discussed care with daughters at bedside as described above in HPI.  Very possible patient could pass away in transport if sought inpatient hospice referral.  Patient's family would prefer he remains in the hospital at this time for symptom management.     At this time continuing to monitor and adjust symptom management medications as not stable for transfer.  -  Code Status: DNR Prognosis: Hours to days  appears markedly limited, possibly not more than 1-2 days at this time in my opinion based on patient's clinical presentation. # Symptom management:   -Pain/Dyspnea, acute in the setting of end-of-life care           Continue current pain management regimen.                   -Anxiety/agitation, in the setting of end-of-life care                               -Continue IV Ativan 0.5 mg every 4 hours as needed. Continue to adjust based on patient's symptom burden.                                 -  And also has IV Haldol 0.5 mg every 4 hours as needed. Continue to adjust based on patient's symptom burden.                   -Secretions, in the setting of end-of-life care                               -Continue IV glycopyrrolate 0.2 mg every 4 hours as needed.  # Psychosocial Support:  - Support System: 2 daughters (only children)   # Discharge Planning: Continuing with comfort focused care at this time.  Patient does not appear stable for transfer to inpatient hospice  facility at this time.  Family wanting to continue with comfort focused care in the hospital.  Ok to transfer out of ICU.   Discussed with: Daughters,   Thank you for allowing the palliative care team to participate in the care Jerry Wood. MOD MDM Jerry Chance MD Palliative Care Provider PMT # [phone]  If patient remains symptomatic despite maximum doses, please call PMT at [phone] between 0700 and 1900. Outside of these hours, please call attending, as PMT does not have night coverage.

## 2022-05-30 NOTE — Progress Notes (Signed)
PROGRESS NOTE  Jerry Wood  DOB: 23-Sep-1921  PCP: Johna Roles, [place] MRN:[id number]  DOA: 06/08/2022  LOS: 5 days  Hospital Day: 6  Brief narrative: Jerry Wood is a 88 y.o. male with PMH significant for Parkinson's disease, tachybradycardia syndrome status post PPM, CAD status post PCI, CKD, wheelchairbound who was recently hospitalized 11/16-11/22 for left-sided weakness and was discharged back to SNF. Per family, while at SNF, patient was diagnosed with COVID pneumonia on 11/30 and was treated with a course of antiviral.  However her condition gradually worsened, he became more lethargic, oral intake got compromised and sodium level was noted to be elevated at 151.  Hence he was sent back to ED on 06/07/2022.    In the ED, patient was noted to be in respiratory failure.  Respiratory virus panel was positive for metapneumovirus. He was started on conservative management. Palliative care consultation was obtained 12/9, family made choice to switch him to comfort care.  Subjective: Patient was seen and briefly examined this morning.  Elderly male.  Somnolent.  Shallow breathing.  Does not respond on command or touch.  Family not at bedside.  Comfortable  Assessment and plan: Comfort care status  Initially admitted for respiratory failure.  He already had pre-existing frail physical and mental health has been declining for several weeks.  Palliative care consult was obtained.  Family made a choice to switch her to comfort care on 12/9  Continue comfort care order set with IV morphine, IV Ativan as needed He may need residential hospice placement.  Ordered TOC consult for hospice placement  Other acute issues currently not being addressed: Acute respiratory failure with hypoxia Respiratory viral panel positive for metapneumovirus.   Infection due to human metapneumovirus (hMPV) History of COVID19 Hypernatremia Dehydration Acute CVA (cerebrovascular  accident) Tachycardiabradycardia  HTN (hypertension) Pure hypercholesterolemia Coronary atherosclerosis of native coronary artery Chronic atrial fibrillation Parkinson disease  Goals of care   Code Status: DNR    Scheduled Meds:  mouth rinse  15 mL Mouth Rinse 4 times per day    PRN meds: sodium chloride, antiseptic oral rinse, glycopyrrolate, haloperidol lactate, levalbuterol, lip balm, LORazepam, morphine, [DISCONTINUED] ondansetron **OR** ondansetron (ZOFRAN) IV, mouth rinse, polyvinyl alcohol   Infusions:   sodium chloride Stopped (05/29/22 1922)   morphine 2 mg/hr (05/30/22 1000)          Diet:  Diet Order     None       Consultants: None Family Communication: None at bedside  Status is: Inpatient  Continue inhospital care because: Comfort care status. Level of care: Palliative Care   Dispo: The patient is from: Home              Anticipated d/c is to: Will try residential hospice placement      Antimicrobials: Anti-infectives (From admission, onward)    Start     Dose/Rate Route Frequency Ordered Stop   05/26/22 0800  Ampicillin-Sulbactam (UNASYN) 3 g in sodium chloride 0.9 % 100 mL IVPB        3 g 200 mL/hr over 30 Minutes Intravenous Every 12 hours 05/26/22 0537 05/27/22 2140   05/22/2022 2000  Ampicillin-Sulbactam (UNASYN) 3 g in sodium chloride 0.9 % 100 mL IVPB  Status:  Discontinued        3 g 200 mL/hr over 30 Minutes Intravenous Every 12 hours 05/20/2022 1948 05/26/22 0537       Objective: Vitals:   05/30/22 0600 05/30/22 0700  BP:  Pulse: (!) 101 (!) 103  Resp: (!) 27 (!) 21  SpO2: 97% 97%    Intake/Output Summary (Last 24 hours) at 05/30/2022 1053 Last data filed at 05/30/2022 1000 Gross per 24 hour  Intake 144.56 ml  Output 325 ml  Net -180.44 ml   Filed Weights   05/27/22 0500 05/28/22 0500 05/29/22 0500  Weight: 68.3 kg 66.6 kg 66.5 kg   Weight change:  Body mass index is 27.7 kg/m.   Physical Exam: General  exam: Comfortable.  I did not do a detailed examination because of Comfort Care status  Data Review: I have personally reviewed the laboratory data and studies available.  F/u labs  Unresulted Labs (From admission, onward)    None       Signed, Terrilee Croak, MD [facility] 05/30/2022

## 2022-05-31 DIAGNOSIS — J9601 Acute respiratory failure with hypoxia: Secondary | ICD-10-CM | POA: Diagnosis not present

## 2022-05-31 DIAGNOSIS — Z515 Encounter for palliative care: Secondary | ICD-10-CM

## 2022-05-31 LAB — CULTURE, BLOOD (ROUTINE X 2): Culture: NO GROWTH

## 2022-05-31 NOTE — Progress Notes (Signed)
Daily Progress Note   Patient Name: Jerry Wood       Date: 05/31/2022 DOB: December 14, 1921  Age: 88 y.o. MRN#: [id number] Attending Physician: Terrilee Croak, MD Primary Care Physician: Johna Roles, PA Admit Date: 05/31/2022 Length of Stay: 6 days  Reason for Consultation/Follow-up: Establishing goals of care and Terminal Care  Subjective:   CC: Patient is not responsive, open mouth breathing going on,  daughter at bedside.   Following up regarding complex medical decision making and symptom management at the end of life.  Subjective: Chart reviewed, patient seen and examined remains comfortable on 2 mg/h morphine infusion.  Patient received morphine bolus while being moved out of the ICU up to the sixth floor on 05-30-2022.    Presented to bedside to check on patient.  Both of patient's daughters were present at bedside.  We discussed about end-of-life signs and symptoms.  Today has terminal fever.  Review of Systems Patient unresponsive, shallow breathing short sighing respirations, apneic pauses noted  Objective:   Vital Signs:  BP 134/72 (BP Location: Right Arm)   Pulse (!) 133   Temp 99.6 F (37.6 C) (Oral)   Resp 18   Ht '5\' 1"'$  (1.549 m)   Wt 66.5 kg   SpO2 (!) 85%   BMI 27.70 kg/m   Physical Exam: General: Unresponsive, shallow breathing short sighing respirations, apneic pauses noted. Eyes: No drainage noted HENT: Dry mucous membranes, some mandibular breathing noted  Cardiovascular: RRR Respiratory: Shallow breath sounds, possibly also with some apneic spells Abdomen: not distended Neuro: Lethargic  Imaging: I personally reviewed recent imaging.   Assessment & Plan:   Assessment: Patient is a 88 year old male with a past medical history of Parkinson's disease, tachybradycardia cardia syndrome status post PPM, CAD status post PCI, CKD, wheelchair-bound status, CVA in 05/07/2022 with residual left sided weakness, and COVID diagnosed on 05/17/2022 at  outside facility who was admitted on 06/11/2022 for management of decreased appetite, decreased oral intake, worsening confusion and speech.  Since being admitted, patient diagnosed with acute respiratory failure with hypoxia possibly type factorial reasons; patient seen to have [facility] respiration with periods of apnea.  Imaging has shown worsening of pulmonary edema concerning for CHF.  Patient has received management for possible infection and dehydration.  Palliative medicine team consulted to assist with complex medical decision making.   Recommendations/Plan: # Complex medical decision making/goals of care:  - Patient was transition to comfort focused care on 05/26/2022.  Continuing with comfort focused care at this time.  Discussed care with daughters at bedside as described above in HPI.  Very possible patient could pass away in transport if sought inpatient hospice referral.  Patient's family would prefer he remains in the hospital at this time for symptom management.     At this time continuing to monitor and adjust symptom management medications as not stable for transfer.  -  Code Status: DNR Prognosis: Hours to days  appears markedly limited, possibly not more than 1-2 days at this time in my opinion based on patient's clinical presentation. # Symptom management:   -Pain/Dyspnea, acute in the setting of end-of-life care           Continue current pain management regimen.                   -Anxiety/agitation, in the setting of end-of-life care                               -  Continue IV Ativan 0.5 mg every 4 hours as needed. Continue to adjust based on patient's symptom burden.                                 -And also has IV Haldol 0.5 mg every 4 hours as needed. Continue to adjust based on patient's symptom burden.                   -Secretions, in the setting of end-of-life care                               -Continue IV glycopyrrolate 0.2 mg every 4 hours as needed.  #  Psychosocial Support:  - Support System: 2 daughters (only children)   # Discharge Planning: Continuing with comfort focused care at this time.  Patient does not appear stable for transfer to inpatient hospice facility at this time.  Family wanting to continue with comfort focused care in the hospital.    Discussed with: Daughters,   Thank you for allowing the palliative care team to participate in the care Jerry Wood. MOD MDM Loistine Chance MD Palliative Care Provider PMT # [phone]  If patient remains symptomatic despite maximum doses, please call PMT at [phone] between 0700 and 1900. Outside of these hours, please call attending, as PMT does not have night coverage.

## 2022-05-31 NOTE — TOC Progression Note (Signed)
Transition of Care [facility]) - Progression Note    Patient Details  Name: [name] MRN: [id number] Date of Birth: 29-Sep-1921  Transition [facility]) CM/SW [place], RN Phone Number:[phone]  05/31/2022, 7:27 PM  Clinical Narrative:    TOC signing off for expected hospital death.        Expected Discharge Plan and Services                                                 Social Determinants of Health (SDOH) Interventions    Readmission Risk Interventions     No data to display

## 2022-05-31 NOTE — Progress Notes (Signed)
PROGRESS NOTE  Jerry Wood  DOB: 1922/01/26  PCP: Johna Roles, [place] MRN:[id number]  DOA: 06/16/2022  LOS: 6 days  Hospital Day: 7  Brief narrative: Jerry Wood is a 88 y.o. male with PMH significant for Parkinson's disease, tachybradycardia syndrome status post PPM, CAD status post PCI, CKD, wheelchairbound who was recently hospitalized 11/16-11/22 for left-sided weakness and was discharged back to SNF. Per family, while at SNF, patient was diagnosed with COVID pneumonia on 11/30 and was treated with a course of antiviral.  However her condition gradually worsened, he became more lethargic, oral intake got compromised and sodium level was noted to be elevated at 151.  Hence he was sent back to ED on 05/30/2022.    In the ED, patient was noted to be in respiratory failure.  Respiratory virus panel was positive for metapneumovirus. He was started on conservative management. Palliative care consultation was obtained 12/9, family made choice to switch him to comfort care.  Subjective: Patient was seen and briefly examined this morning.  Comfortable.  Family not at bedside   Assessment and plan: Comfort care status  Initially admitted for respiratory failure.  He already had pre-existing frail physical and mental health has been declining for several weeks.  Palliative care consult was obtained.  Family made a choice to switch her to comfort care on 12/9  Continue comfort care order set with IV morphine, IV Ativan as needed  In-hospital mortality in next 1 to 2 days.  Family is not interested in residential hospice placement.  Other acute issues currently not being addressed: Acute respiratory failure with hypoxia Respiratory viral panel positive for metapneumovirus.   Infection due to human metapneumovirus (hMPV) History of COVID19 Hypernatremia Dehydration Acute CVA (cerebrovascular accident) Tachycardiabradycardia  HTN (hypertension) Pure hypercholesterolemia Coronary  atherosclerosis of native coronary artery Chronic atrial fibrillation Parkinson disease  Goals of care   Code Status: DNR    Scheduled Meds:  mouth rinse  15 mL Mouth Rinse 4 times per day    PRN meds: sodium chloride, antiseptic oral rinse, glycopyrrolate, haloperidol lactate, levalbuterol, lip balm, LORazepam, morphine, [DISCONTINUED] ondansetron **OR** ondansetron (ZOFRAN) IV, mouth rinse, polyvinyl alcohol   Infusions:   sodium chloride Stopped (05/29/22 1922)   morphine 2 mg/hr (05/30/22 1000)          Diet:  Diet Order     None       Consultants: None Family Communication: None at bedside  Status is: Inpatient  Continue inhospital care because: Comfort care status. Level of care: Palliative Care   Dispo: The patient is from: Home              Anticipated d/c is to: Expected in-hospital mortality      Antimicrobials: Anti-infectives (From admission, onward)    Start     Dose/Rate Route Frequency Ordered Stop   05/26/22 0800  Ampicillin-Sulbactam (UNASYN) 3 g in sodium chloride 0.9 % 100 mL IVPB        3 g 200 mL/hr over 30 Minutes Intravenous Every 12 hours 05/26/22 0537 05/27/22 2140   05/22/2022 2000  Ampicillin-Sulbactam (UNASYN) 3 g in sodium chloride 0.9 % 100 mL IVPB  Status:  Discontinued        3 g 200 mL/hr over 30 Minutes Intravenous Every 12 hours 05/31/2022 1948 05/26/22 0537       Objective: Vitals:   05/31/22 0410 05/31/22 0415  BP:  134/72  Pulse:  (!) 133  Resp: 11 18  Temp:  99.6 F (37.6 C)  SpO2:  (!) 85%    Intake/Output Summary (Last 24 hours) at 05/31/2022 1303 Last data filed at 05/31/2022 0817 Gross per 24 hour  Intake --  Output 350 ml  Net -350 ml   Filed Weights   05/27/22 0500 05/28/22 0500 05/29/22 0500  Weight: 68.3 kg 66.6 kg 66.5 kg   Weight change:  Body mass index is 27.7 kg/m.   Physical Exam: General exam: Comfortable.  I did not do a detailed examination because of Comfort Care status  Data  Review: I have personally reviewed the laboratory data and studies available.  F/u labs  Unresulted Labs (From admission, onward)    None       Signed, Terrilee Croak, MD [facility] 05/31/2022

## 2022-06-18 NOTE — Death Summary Note (Signed)
DEATH SUMMARY   Patient Details  Name: Jerry Wood MRN: [id number] DOB: [birth year] KDT:OIZTIWPY, Delrae Rend, PA Admission/Discharge Information   Admit Date:  06-20-2022  Date of Death: Date of Death: 06-27-2022  Time of Death: Time of Death: 0430  Length of Stay: [facility] Diagnoses: Principal Problem:   Acute respiratory failure with hypoxia ([place]) Active Problems:   Parkinson disease   Chronic atrial fibrillation ([place])   Coronary atherosclerosis of native coronary artery   Pure hypercholesterolemia   HTN (hypertension)   Tachycardia-bradycardia ([place])   Presence of permanent cardiac pacemaker   Acute CVA (cerebrovascular accident) ([place])   Hypoxia   Cheyne-Stokes respiration   Dehydration   Hypernatremia   History of COVID-19: Recent infection diagnosed 05/17/2022 per family   Abnormal breathing   Infection due to human metapneumovirus (hMPV)   COVID-19 with multiple comorbidities   Dyspnea   Pain   Agitation   High risk medication use   Goals of care, counseling/discussion   Dying care   Hospital Course: Jerry Wood is a 89 y.o. male with PMH significant for Parkinson's disease, tachybradycardia syndrome status post PPM, CAD status post PCI, CKD, wheelchairbound who was recently hospitalized 11/16-11/22 for left-sided weakness and was discharged back to SNF. Per family, while at SNF, patient was diagnosed with COVID pneumonia on 11/30 and was treated with a course of antiviral.  However her condition gradually worsened, he became more lethargic, oral intake got compromised and sodium level was noted to be elevated at 151.  Hence he was sent back to ED on 06/20/2022.     In the ED, patient was noted to be in respiratory failure.  Respiratory virus panel was positive for metapneumovirus. He was started on conservative management. Palliative care consultation was obtained 12/9, family made choice to switch him to comfort care. Patient was made comfortable on IV  morphine, IV Ativan as needed. He expired on 06-28-2023 at 4:30 AM.  Principle Cause of death: Acute hypoxic respiratory failure due to viral pneumonia        The results of significant diagnostics from this hospitalization (including imaging, microbiology, ancillary and laboratory) are listed below for reference.   Significant Diagnostic Studies: ECHOCARDIOGRAM COMPLETE  Result Date: 05/26/2022    ECHOCARDIOGRAM REPORT   Patient Name:   Jerry Wood Date of Exam: 05/26/2022 Medical Rec #:  [id number]      Height:       61.0 in Accession #:    [id number]     Weight:       150.8 lb Date of Birth:  1922-05-30      BSA:          1.675 m Patient Age:    46 years       BP:           123/99 mmHg Patient Gender: M              HR:           83 bpm. Exam Location:  Inpatient Procedure: 2D Echo, Cardiac Doppler and Color Doppler Indications:    Elevated troponins  History:        Patient has prior history of Echocardiogram examinations, most                 recent 05/04/2022. CAD, Pacemaker, Stroke and COPD,                 Arrythmias:Tachycardia, Bradycardia and Atrial Fibrillation;  Risk Factors:Hypertension and Dyslipidemia. CKD.  Sonographer:    Eartha Inch Referring Phys: [id number] DANIEL V THOMPSON  Sonographer Comments: Technically difficult study due to poor echo windows. Image acquisition challenging due to patient body habitus, Image acquisition challenging due to respiratory motion and Image acquisition challenging due to COPD. IMPRESSIONS  1. Left ventricular ejection fraction, by estimation, is 55 to 60%. The left ventricle has normal function. The left ventricle has no regional wall motion abnormalities. Diastolic function indeterminant due to Afib.  2. Right ventricular systolic function is mildly reduced. The right ventricular size is mildly-to-moderately enlarged. There is moderately elevated pulmonary artery systolic pressure. The estimated right ventricular systolic pressure is 71.6  mmHg.  3. Left atrial size was severely dilated.  4. Right atrial size was severely dilated.  5. The mitral valve is degenerative. There is moderate to severe mitral regurgitation that appears to have progressed significantly compared to prior TTE. Could consider TEE if clinically indicated.  6. Tricuspid valve regurgitation is moderate.  7. The aortic valve is tricuspid. There is moderate calcification of the aortic valve. There is moderate thickening of the aortic valve. Aortic valve regurgitation is trivial. Possible mild AS although suboptimal doppler interrogation. Was noted on prior TTE.  8. The inferior vena cava is normal in size with greater than 50% respiratory variability, suggesting right atrial pressure of 3 mmHg. Comparison(s): Prior images reviewed side by side. Compared to prior TTE 05/04/2022, there is now moderate to severe MR (previously mild) and moderate TR (previously mild). FINDINGS  Left Ventricle: Left ventricular ejection fraction, by estimation, is 55 to 60%. The left ventricle has normal function. The left ventricle has no regional wall motion abnormalities. The left ventricular internal cavity size was normal in size. There is  no left ventricular hypertrophy. Diastolic function indeterminant due to Afib. Right Ventricle: The right ventricular size is mildly-to-moderately enlarged. No increase in right ventricular wall thickness. Right ventricular systolic function is mildly reduced. There is moderately elevated pulmonary artery systolic pressure. The tricuspid regurgitant velocity is 3.34 m/s, and with an assumed right atrial pressure of 3 mmHg, the estimated right ventricular systolic pressure is 96.7 mmHg. Left Atrium: Left atrial size was severely dilated. Right Atrium: Right atrial size was severely dilated. Pericardium: Trivial pericardial effusion is present. The pericardial effusion is anterior to the right ventricle. Mitral Valve: The mitral valve is degenerative in appearance.  There is mild thickening of the mitral valve leaflet(s). There is mild calcification of the mitral valve leaflet(s). Moderate to severe mitral valve regurgitation. Tricuspid Valve: The tricuspid valve is normal in structure. Tricuspid valve regurgitation is moderate. Aortic Valve: The aortic valve is tricuspid. There is moderate calcification of the aortic valve. There is moderate thickening of the aortic valve. Aortic valve regurgitation is trivial. Possible mild AS although suboptimal doppler interrogation. Was noted on prior TTE. Pulmonic Valve: The pulmonic valve was grossly normal. Pulmonic valve regurgitation is trivial. Aorta: The aortic root is normal in size and structure. Venous: The inferior vena cava is normal in size with greater than 50% respiratory variability, suggesting right atrial pressure of 3 mmHg. IAS/Shunts: The atrial septum is grossly normal. Additional Comments: A device lead is visualized.  LEFT VENTRICLE PLAX 2D LVIDd:         3.40 cm     Diastology LVIDs:         2.20 cm     LV e' medial:    6.13 cm/s LV PW:  0.90 cm     LV E/e' medial:  13.6 LV IVS:        0.80 cm     LV e' lateral:   8.50 cm/s LVOT diam:     1.70 cm     LV E/e' lateral: 9.8 LV SV:         27 LV SV Index:   16 LVOT Area:     2.27 cm  LV Volumes (MOD) LV vol d, MOD A2C: 72.9 ml LV vol d, MOD A4C: 79.6 ml LV vol s, MOD A2C: 38.8 ml LV vol s, MOD A4C: 33.1 ml LV SV MOD A2C:     34.1 ml LV SV MOD A4C:     79.6 ml LV SV MOD BP:      43.0 ml RIGHT VENTRICLE            IVC RV S prime:     9.29 cm/s  IVC diam: 1.90 cm TAPSE (M-mode): 1.6 cm LEFT ATRIUM              Index         RIGHT ATRIUM           Index LA diam:        5.20 cm  3.10 cm/m    RA Area:     21.50 cm LA Vol (A2C):   149.0 ml 88.95 ml/m   RA Volume:   63.30 ml  37.79 ml/m LA Vol (A4C):   224.0 ml 133.72 ml/m LA Biplane Vol: 196.0 ml 117.00 ml/m  AORTIC VALVE LVOT Vmax:   67.90 cm/s LVOT Vmean:  48.700 cm/s LVOT VTI:    0.119 m  AORTA Ao Root diam:  2.90 cm MITRAL VALVE               TRICUSPID VALVE MV Area (PHT): 4.06 cm    TR Peak grad:   44.6 mmHg MV Decel Time: 187 msec    TR Vmax:        334.00 cm/s MV E velocity: 83.43 cm/s                            SHUNTS                            Systemic VTI:  0.12 m                            Systemic Diam: 1.70 cm Gwyndolyn Kaufman MD Electronically signed by Gwyndolyn Kaufman MD Signature Date/Time: 05/26/2022/11:22:31 AM    Final    Portable chest 1 View  Result Date: 05/26/2022 CLINICAL DATA:  Respiratory failure and hypoxia EXAM: PORTABLE CHEST 1 VIEW COMPARISON:  Film from the previous day. FINDINGS: Cardiac shadow is enlarged but stable. Pacing device is again seen. Increasing central vascular congestion is noted with increasing edema. Small effusions are again noted. No focal confluent infiltrate is seen. IMPRESSION: Worsening CHF with bilateral effusions. Electronically Signed   By: Inez Catalina M.D.   On: 05/26/2022 04:02   CT HEAD WO CONTRAST (5MM)  Result Date: 06/07/2022 CLINICAL DATA:  Neuro deficit, acute, stroke suspected. Abnormal breathing pattern. Recent stroke. EXAM: CT HEAD WITHOUT CONTRAST TECHNIQUE: Contiguous axial images were obtained from the base of the skull through the vertex without intravenous contrast. RADIATION DOSE REDUCTION: This exam was performed according to the  departmental dose-optimization program which includes automated exposure control, adjustment of the mA and/or kV according to patient size and/or use of iterative reconstruction technique. COMPARISON:  MRI 05/04/2022.  CT 05/03/2022. FINDINGS: Brain: Generalized age related atrophy. No focal abnormality seen affecting the brainstem or cerebellum. No focal or acute supra tentorial stroke identified. Small acute infarctions seen in the right frontal opercular region in November are not specifically identified. No evidence of acute extension. No swelling, mass, hemorrhage, hydrocephalus or extra-axial collection.  Vascular: There is atherosclerotic calcification of the major vessels at the base of the brain. Skull: Negative Sinuses/Orbits: Clear/normal Other: None IMPRESSION: 1. No acute CT finding. Generalized age related atrophy. Small acute infarctions seen in the right frontal opercular region in November by mri are not specifically identified by ct. No evidence of acute extension. 2. Atherosclerotic calcification of the major vessels at the base of the brain. Electronically Signed   By: Nelson Chimes M.D.   On: 06/03/2022 17:22   DG Chest Portable 1 View  Result Date: 05/18/2022 CLINICAL DATA:  Shortness of breath. EXAM: PORTABLE CHEST 1 VIEW COMPARISON:  May 03, 2022. FINDINGS: Stable cardiomegaly with mild central pulmonary vascular congestion. Mild bibasilar subsegmental atelectasis or edema is noted. Small pleural effusions may be present. Left-sided pacemaker is unchanged in position. Bony thorax is unremarkable. IMPRESSION: Stable cardiomegaly with mild central pulmonary vascular congestion. Mild bibasilar subsegmental atelectasis or edema is noted with small pleural effusions. Electronically Signed   By: Marijo Conception M.D.   On: 05/19/2022 15:57   VAS US CAROTID  Result Date: 05/14/2022 Carotid Arterial Duplex Study Patient Name:  Jerry Wood  Date of Exam:   05/04/2022 Medical Rec #: [id number]       Accession #:    [id number] Date of Birth: 1922-01-15       Patient Gender: M Patient Age:   43 years Exam Location:  [facility] Procedure:      VAS US CAROTID Referring Phys: Janine Ores --------------------------------------------------------------------------------  Indications:       Speech disturbance and Visual disturbance. Risk Factors:      Hypertension, coronary artery disease. Comparison Study:  no prior Performing Technologist: Archie Patten RVS  Examination Guidelines: A complete evaluation includes B-mode imaging, spectral Doppler, color Doppler, and power Doppler as needed of  all accessible portions of each vessel. Bilateral testing is considered an integral part of a complete examination. Limited examinations for reoccurring indications may be performed as noted.  Right Carotid Findings: +----------+--------+--------+--------+------------------+--------+           PSV cm/sEDV cm/sStenosisPlaque DescriptionComments +----------+--------+--------+--------+------------------+--------+ CCA Prox  58      12              heterogenous               +----------+--------+--------+--------+------------------+--------+ CCA Distal59      12              heterogenous               +----------+--------+--------+--------+------------------+--------+ ICA Prox  55      18      1-39%   heterogenous      tortuous +----------+--------+--------+--------+------------------+--------+ ICA Distal60      17                                         +----------+--------+--------+--------+------------------+--------+ ECA  88      7                                          +----------+--------+--------+--------+------------------+--------+ +----------+--------+-------+--------+-------------------+           PSV cm/sEDV cmsDescribeArm Pressure (mmHG) +----------+--------+-------+--------+-------------------+ [id number]                                         +----------+--------+-------+--------+-------------------+ +---------+--------+--+--------+--+---------+ VertebralPSV cm/s46EDV cm/s10Antegrade +---------+--------+--+--------+--+---------+  Left Carotid Findings: +----------+--------+--------+--------+------------------+--------+           PSV cm/sEDV cm/sStenosisPlaque DescriptionComments +----------+--------+--------+--------+------------------+--------+ CCA Prox  60      14              heterogenous               +----------+--------+--------+--------+------------------+--------+ CCA Distal68      14              heterogenous                +----------+--------+--------+--------+------------------+--------+ ICA Prox  52      12      1-39%   heterogenous               +----------+--------+--------+--------+------------------+--------+ ICA Distal65      21                                         +----------+--------+--------+--------+------------------+--------+ ECA       93                                                 +----------+--------+--------+--------+------------------+--------+ +----------+--------+--------+--------+-------------------+           PSV cm/sEDV cm/sDescribeArm Pressure (mmHG) +----------+--------+--------+--------+-------------------+ [id number]                                          +----------+--------+--------+--------+-------------------+ +---------+--------+--+--------+--+---------+ VertebralPSV cm/s42EDV cm/s13Antegrade +---------+--------+--+--------+--+---------+   Summary: Right Carotid: Velocities in the right ICA are consistent with a 1-39% stenosis. Left Carotid: Velocities in the left ICA are consistent with a 1-39% stenosis. Vertebrals: Bilateral vertebral arteries demonstrate antegrade flow. *See table(s) above for measurements and observations.  Electronically signed by Antony Contras MD on 05/14/2022 at 8:13:51 AM.    Final    DG Swallowing Func-Speech Pathology  Result Date: 05/07/2022 Table formatting from the original result was not included. Objective Swallowing Evaluation: Type of Study: MBS-Modified Barium Swallow Study  Patient Details Name: Jerry Wood MRN: [id number] Date of Birth: [birth year] Today's Date: 05/07/2022 Time: SLP Start Time (ACUTE ONLY): 38 -SLP Stop Time (ACUTE ONLY): 1319 SLP Time Calculation (min) (ACUTE ONLY): 13 min Past Medical History: Past Medical History: Diagnosis Date  Allergic rhinitis   Cancer ([place]) 09/2015  skin ,basal cell  Chronic knee pain   Constipation   COPD, mild ([place])   Coronary artery disease   s/p PCI  of the RCA  Diverticulosis   Dyslipidemia   Gout   Hypertension   Long  term (current) use of anticoagulants   Onychomycosis   Osteoarthrosis, unspecified whether generalized or localized, other specified sites   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 ECHO 06/04/11 LVEF estimated by 2D at 52.1%  Pure hypercholesterolemia   Seborrheic keratosis, inflamed   Skin lesion   Plan to remove left posterior ear lesion with electrocautery  Tachy-brady syndrome [facility])   s/p permanent pacemaker placement  Unspecified hypothyroidism   Vertigo   Vitamin D deficiency  Past Surgical History: Past Surgical History: Procedure Laterality Date  BIV PACEMAKER GENERATOR CHANGEOUT N/A 03/13/2022  Procedure: BIV PACEMAKER GENERATOR CHANGEOUT;  Surgeon: Vickie Epley, MD;  Location: [place] [facility];  Service: Cardiovascular;  Laterality: N/A;  FRACTURE SURGERY Right   clavicle  INGUINAL HERNIA REPAIR Bilateral   LAPAROSCOPIC PARTIAL COLECTOMY    Partial resection of sigmoid colon  PACEMAKER PLACEMENT  2003  Tachybradycardia syndrome. Gen change 07/29/08, Dr. Leonia Reeves  TYMPANOPLASTY Bilateral  HPI: Pt is a 89 y/o male who presented with L facial droop and slurred speech. CT head negative, MRI brain pending pacemaker compatibility. PMH: Parkinson's, COPD, a fib, HTN, PPM, CAD, CKD3  No data recorded  Recommendations for follow up therapy are one component of a multi-disciplinary discharge planning process, led by the attending physician.  Recommendations may be updated based on patient status, additional functional criteria and insurance authorization. Assessment / Plan / Recommendation   05/07/2022   1:24 PM Clinical Impressions Clinical Impression Pt presents with moderate dysphagia with deficits predominately in the pharyngeal phase. Reduced epiglottic deflection noted across all consistencies, which led to  penetration of puree consistency and nectar thick liquids from cup (PAS 3). With thin liquids, the reduced epiglottic deflection led to penetration and silent aspiration (PAS 3, 8) during the swallow. Mild pharyngeal residue in the valleculae and pyriform sinuses intermittently noted with both liquid consistencies via cup sip, however, with nectar thick liquids, the residue spilled into the laryngeal vestibule post swallow (PAS 3). Pt did not sense any penetrated or aspirated material, however, when cued to clear throat/cough the remaining material was cleared from airway. Nectar thick liquids administered via straw provided slightly larger and heavier boluses which helped facilitate a more coordinated swallow, increased epiglottic deflection which led to reduced pharyngeal residue. One instance of premature spillage noted with straw administration, however, no instances of airway intrusion noted. Within the oral phase, mild amounts of lingual residue noted with liquids, which required a second swallow to clear residue on nectar thick liquid trials. However, mastication of regular consistency was efficient and adequate. Of note, pt presents with a prominent cricopharyngeal bar however material able to propel UES adequately. Suspect esophageal involvement as scan of the esophagus showed moderate amounts of bolus remaining in the distal esophagus. Despite recommendations for modified diet and environmental controls, he remains at a higher aspiration risk. Recommend nectar thick liquids via straw and continue regular consistency diet. Full supervision to cue pt to clear throat intermittently throughout meals, medications crushed. SLP Visit Diagnosis Dysphagia, oropharyngeal phase (R13.12)     05/07/2022   1:24 PM Treatment Recommendations Treatment Recommendations Therapy as outlined in treatment plan below     05/07/2022   1:24 PM Prognosis Prognosis for Safe Diet Advancement Good Barriers to Reach Goals Severity of  deficits   05/07/2022   1:24 PM Diet Recommendations SLP Diet Recommendations Nectar thick liquid;Regular solids Liquid Administration via  Straw Medication Administration Crushed with puree Compensations Slow rate;Small sips/bites;Clear throat intermittently Postural Changes Seated upright at 90 degrees     05/07/2022   1:24 PM Other Recommendations Oral Care Recommendations Oral care BID Follow Up Recommendations Skilled nursing-short term rehab (<3 hours/day) Functional Status Assessment Patient has had a recent decline in their functional status and demonstrates the ability to make significant improvements in function in a reasonable and predictable amount of time.   05/07/2022   1:24 PM Frequency and Duration  Speech Therapy Frequency (ACUTE ONLY) min 2x/week Treatment Duration 2 weeks     05/07/2022   1:24 PM Oral Phase Oral Phase Impaired Oral - Nectar Teaspoon NT Oral - Nectar Cup Lingual/palatal residue Oral - Nectar Straw Lingual/palatal residue;Premature spillage;Decreased bolus cohesion Oral - Thin Teaspoon NT Oral - Thin Cup Lingual/palatal residue Oral - Thin Straw NT Oral - Puree Lingual/palatal residue Oral - Regular [facility]    05/07/2022   1:24 PM Pharyngeal Phase Pharyngeal Phase Impaired Pharyngeal- Nectar Teaspoon NT Pharyngeal- Nectar Cup Delayed swallow initiation-vallecula;Pharyngeal residue - valleculae;Pharyngeal residue - pyriform;Penetration/Apiration after swallow;Penetration/Aspiration during swallow;Reduced airway/laryngeal closure;Reduced epiglottic inversion Pharyngeal Material enters airway, remains ABOVE vocal cords and not ejected out Pharyngeal- Nectar Straw Reduced epiglottic inversion;Pharyngeal residue - valleculae Pharyngeal Material does not enter airway Pharyngeal- Thin Teaspoon NT Pharyngeal- Thin Cup Pharyngeal residue - valleculae;Pharyngeal residue - pyriform;Penetration/Aspiration during swallow;Reduced airway/laryngeal closure;Reduced epiglottic inversion Pharyngeal  Material enters airway, passes BELOW cords without attempt by patient to eject out (silent aspiration);Material enters airway, remains ABOVE vocal cords and not ejected out Pharyngeal- Thin Straw NT Pharyngeal- Puree Delayed swallow initiation-vallecula;Reduced epiglottic inversion;Penetration/Aspiration during swallow;Pharyngeal residue - valleculae Pharyngeal Material enters airway, remains ABOVE vocal cords and not ejected out Pharyngeal- Regular [facility]    05/07/2022   1:24 PM Cervical Esophageal Phase  Cervical Esophageal Phase -- [place] Siren 05/07/2022, 3:14 PM                     MR BRAIN WO CONTRAST  Result Date: 05/04/2022 CLINICAL DATA:  Neuro deficit, acute, stroke suspected. Left facial droop and slurred speech. EXAM: MRI HEAD WITHOUT CONTRAST MRA HEAD WITHOUT CONTRAST TECHNIQUE: Multiplanar, multi-echo pulse sequences of the brain and surrounding structures were acquired without intravenous contrast. Angiographic images of the Circle of Willis were acquired using MRA technique without intravenous contrast. COMPARISON:  Head CT 05/03/2022 FINDINGS: MRI HEAD FINDINGS Brain: Small acute right MCA territory cortical infarcts are present involving the insula and frontal lobe. Scattered small T2 hyperintensities in the cerebral white matter and pons are nonspecific but compatible with mild chronic small vessel ischemic disease. Chronic lacunar infarcts are noted in the caudate nuclei and cerebellar hemispheres bilaterally. Generalized cerebral atrophy is likely within normal limits for age. No intracranial hemorrhage, mass, midline shift, or extra-axial fluid collection is identified. Vascular: Major intracranial vascular flow voids are preserved. Skull and upper cervical spine: Unremarkable bone marrow signal. Sinuses/Orbits: Bilateral cataract extraction. Clear paranasal sinuses. Small mastoid effusions. Other: None. MRA HEAD FINDINGS Anterior circulation: The internal carotid arteries are widely  patent from skull base to carotid termini. ACAs and MCAs are patent without evidence of a proximal branch occlusion or significant proximal stenosis. No aneurysm is identified. Posterior circulation: The intracranial vertebral arteries are patent to the basilar with the left being dominant. The right vertebral artery is hypoplastic distal to the PICA origin. Patent PICA and SCA origins are seen bilaterally. The basilar artery is widely patent. Posterior communicating arteries are diminutive or  absent. Both PCAs are patent without evidence of significant proximal stenosis. No aneurysm is identified. Anatomic variants: None. IMPRESSION: 1. Small acute right MCA infarcts. 2. Mild chronic small vessel ischemic disease. 3. Negative head MRA. Electronically Signed   By: Logan Bores M.D.   On: 05/04/2022 13:57   MR ANGIO HEAD WO CONTRAST  Result Date: 05/04/2022 CLINICAL DATA:  Neuro deficit, acute, stroke suspected. Left facial droop and slurred speech. EXAM: MRI HEAD WITHOUT CONTRAST MRA HEAD WITHOUT CONTRAST TECHNIQUE: Multiplanar, multi-echo pulse sequences of the brain and surrounding structures were acquired without intravenous contrast. Angiographic images of the Circle of Willis were acquired using MRA technique without intravenous contrast. COMPARISON:  Head CT 05/03/2022 FINDINGS: MRI HEAD FINDINGS Brain: Small acute right MCA territory cortical infarcts are present involving the insula and frontal lobe. Scattered small T2 hyperintensities in the cerebral white matter and pons are nonspecific but compatible with mild chronic small vessel ischemic disease. Chronic lacunar infarcts are noted in the caudate nuclei and cerebellar hemispheres bilaterally. Generalized cerebral atrophy is likely within normal limits for age. No intracranial hemorrhage, mass, midline shift, or extra-axial fluid collection is identified. Vascular: Major intracranial vascular flow voids are preserved. Skull and upper cervical spine:  Unremarkable bone marrow signal. Sinuses/Orbits: Bilateral cataract extraction. Clear paranasal sinuses. Small mastoid effusions. Other: None. MRA HEAD FINDINGS Anterior circulation: The internal carotid arteries are widely patent from skull base to carotid termini. ACAs and MCAs are patent without evidence of a proximal branch occlusion or significant proximal stenosis. No aneurysm is identified. Posterior circulation: The intracranial vertebral arteries are patent to the basilar with the left being dominant. The right vertebral artery is hypoplastic distal to the PICA origin. Patent PICA and SCA origins are seen bilaterally. The basilar artery is widely patent. Posterior communicating arteries are diminutive or absent. Both PCAs are patent without evidence of significant proximal stenosis. No aneurysm is identified. Anatomic variants: None. IMPRESSION: 1. Small acute right MCA infarcts. 2. Mild chronic small vessel ischemic disease. 3. Negative head MRA. Electronically Signed   By: Logan Bores M.D.   On: 05/04/2022 13:57   ECHOCARDIOGRAM COMPLETE BUBBLE STUDY  Result Date: 05/04/2022    ECHOCARDIOGRAM REPORT   Patient Name:   Jerry Wood Date of Exam: 05/04/2022 Medical Rec #:  [id number]      Height:       61.0 in Accession #:    [id number]     Weight:       158.1 lb Date of Birth:  [birth year]      BSA:          1.709 m Patient Age:    12 years       BP:           80/53 mmHg Patient Gender: M              HR:           57 bpm. Exam Location:  Inpatient Procedure: 2D Echo and Saline Contrast Bubble Study Indications:    srtoke  History:        Patient has prior history of Echocardiogram examinations, most                 recent 02/01/2015. Pacemaker, Signs/Symptoms:Edema; Risk                 Factors:Hypertension.  Sonographer:    Harvie Junior Referring Phys: [id number] Russell  Sonographer Comments: Technically difficult study due to poor echo  windows and no subcostal window. IMPRESSIONS  1. Left  ventricular ejection fraction, by estimation, is 55 to 60%. The left ventricle has normal function. The left ventricle has no regional wall motion abnormalities. Left ventricular diastolic function could not be evaluated.  2. Right ventricular systolic function is normal. The right ventricular size is normal.  3. Left atrial size was severely dilated.  4. The mitral valve is grossly normal. Mild mitral valve regurgitation. No evidence of mitral stenosis.  5. The aortic valve is tricuspid. There is moderate calcification of the aortic valve. There is moderate thickening of the aortic valve. Aortic valve regurgitation is not visualized. Mild aortic valve stenosis.  6. Agitated saline contrast bubble study was negative, with no evidence of any interatrial shunt. Conclusion(s)/Recommendation(s): No intracardiac source of embolism detected on this transthoracic study. Consider a transesophageal echocardiogram to exclude cardiac source of embolism if clinically indicated. FINDINGS  Left Ventricle: Left ventricular ejection fraction, by estimation, is 55 to 60%. The left ventricle has normal function. The left ventricle has no regional wall motion abnormalities. The left ventricular internal cavity size was normal in size. Suboptimal image quality limits for assessment of left ventricular hypertrophy. Left ventricular diastolic function could not be evaluated due to atrial fibrillation. Left ventricular diastolic function could not be evaluated. Right Ventricle: The right ventricular size is normal. No increase in right ventricular wall thickness. Right ventricular systolic function is normal. Left Atrium: Left atrial size was severely dilated. Right Atrium: Right atrial size was normal in size. Pericardium: Trivial pericardial effusion is present. Mitral Valve: The mitral valve is grossly normal. There is mild calcification of the anterior mitral valve leaflet(s). Mild mitral valve regurgitation. No evidence of mitral  valve stenosis. Tricuspid Valve: The tricuspid valve is grossly normal. Tricuspid valve regurgitation is mild . No evidence of tricuspid stenosis. Aortic Valve: The aortic valve is tricuspid. There is moderate calcification of the aortic valve. There is moderate thickening of the aortic valve. Aortic valve regurgitation is not visualized. Mild aortic stenosis is present. Aortic valve mean gradient measures 8.0 mmHg. Aortic valve peak gradient measures 13.1 mmHg. Aortic valve area, by VTI measures 1.37 cm. Pulmonic Valve: The pulmonic valve was grossly normal. Pulmonic valve regurgitation is trivial. No evidence of pulmonic stenosis. Aorta: The aortic root and ascending aorta are structurally normal, with no evidence of dilitation. Venous: The inferior vena cava was not well visualized. IAS/Shunts: No atrial level shunt detected by color flow Doppler. Agitated saline contrast was given intravenously to evaluate for intracardiac shunting. Agitated saline contrast bubble study was negative, with no evidence of any interatrial shunt. Additional Comments: A device lead is visualized in the right atrium and right ventricle.  LEFT VENTRICLE PLAX 2D LVIDd:         3.90 cm     Diastology LVIDs:         3.30 cm     LV e' medial:    6.20 cm/s LV PW:         1.00 cm     LV E/e' medial:  10.4 LV IVS:        1.00 cm     LV e' lateral:   9.68 cm/s LVOT diam:     2.00 cm     LV E/e' lateral: 6.7 LV SV:         50 LV SV Index:   29 LVOT Area:     3.14 cm  LV Volumes (MOD) LV vol d, MOD A2C: 35.1  ml LV vol d, MOD A4C: 57.9 ml LV vol s, MOD A2C: 24.3 ml LV vol s, MOD A4C: 23.0 ml LV SV MOD A2C:     10.8 ml LV SV MOD A4C:     57.9 ml LV SV MOD BP:      21.8 ml RIGHT VENTRICLE RV Basal diam:  3.40 cm RV Mid diam:    1.90 cm RV S prime:     17.10 cm/s TAPSE (M-mode): 1.3 cm LEFT ATRIUM              Index        RIGHT ATRIUM           Index LA Vol (A2C):   77.0 ml  45.05 ml/m  RA Area:     13.10 cm LA Vol (A4C):   128.0 ml 74.90 ml/m   RA Volume:   30.50 ml  17.85 ml/m LA Biplane Vol: 104.0 ml 60.85 ml/m  AORTIC VALVE                     PULMONIC VALVE AV Area (Vmax):    1.36 cm      PV Vmax:       0.81 m/s AV Area (Vmean):   1.37 cm      PV Peak grad:  2.7 mmHg AV Area (VTI):     1.37 cm AV Vmax:           181.00 cm/s AV Vmean:          133.000 cm/s AV VTI:            0.362 m AV Peak Grad:      13.1 mmHg AV Mean Grad:      8.0 mmHg LVOT Vmax:         78.30 cm/s LVOT Vmean:        57.800 cm/s LVOT VTI:          0.158 m LVOT/AV VTI ratio: 0.44  AORTA Ao Root diam: 3.30 cm MITRAL VALVE               TRICUSPID VALVE MV Area (PHT): 4.49 cm    TR Peak grad:   29.6 mmHg MV Decel Time: 169 msec    TR Vmax:        272.00 cm/s MR Peak grad: 59.4 mmHg MR Vmax:      385.25 cm/s  SHUNTS MV E velocity: 64.70 cm/s  Systemic VTI:  0.16 m MV A velocity: 35.10 cm/s  Systemic Diam: 2.00 cm MV E/A ratio:  1.84 Eleonore Chiquito MD Electronically signed by Eleonore Chiquito MD Signature Date/Time: 05/04/2022/11:21:27 AM    Final    DG Chest 1 View  Result Date: 05/03/2022 CLINICAL DATA:  [id number] Pneumonia [id number] EXAM: CHEST  1 VIEW COMPARISON:  Chest x-ray 01/26/2015 FINDINGS: Left chest wall 2 lead pacemaker in grossly appropriate position. The heart and mediastinal contours are unchanged. No focal consolidation. No pulmonary edema. No pleural effusion. No pneumothorax. No acute osseous abnormality. IMPRESSION: No active disease. Electronically Signed   By: Iven Finn M.D.   On: 05/03/2022 18:20   CT HEAD CODE STROKE WO CONTRAST  Result Date: 05/03/2022 CLINICAL DATA:  Code stroke. EXAM: CT HEAD WITHOUT CONTRAST TECHNIQUE: Contiguous axial images were obtained from the base of the skull through the vertex without intravenous contrast. RADIATION DOSE REDUCTION: This exam was performed according to the departmental dose-optimization program which includes automated exposure control, adjustment of the mA and/or  kV according to patient size and/or use of  iterative reconstruction technique. COMPARISON:  CT head 07/21/2012 FINDINGS: Brain: There is no acute intracranial hemorrhage, extra-axial fluid collection, or acute infarct. Parenchymal volume is normal for age. The ventricles are normal in size. Gray-white differentiation is preserved. There is a small remote infarct in the right caudate head and small remote infarcts in the bilateral cerebellar hemispheres, unchanged since 2014. There is no mass lesion.  There is no mass effect or midline shift. Vascular: No dense vessel is seen. There is calcification of the bilateral carotid siphons. Skull: Normal. Negative for fracture or focal lesion. Sinuses/Orbits: The paranasal sinuses are clear. Bilateral lens implants are in place. The globes and orbits are otherwise unremarkable. Other: None. ASPECTS [facility] Stroke Program Early CT Score) - Ganglionic level infarction (caudate, lentiform nuclei, internal capsule, insula, M1-M3 cortex): 7 - Supraganglionic infarction (M4-M6 cortex): 3 Total score (0-10 with 10 being normal): 10 IMPRESSION: 1. No acute intracranial pathology. 2. ASPECTS is 10 Findings communicated to Dr. Rory Percy at 5:38 p.m. Electronically Signed   By: Valetta Mole M.D.   On: 05/03/2022 17:42    Microbiology: Recent Results (from the past 240 hour(s))  Blood culture (routine x 2)     Status: None   Collection Time: 05/28/2022  3:20 PM   Specimen: BLOOD RIGHT HAND  Result Value Ref Range Status   Specimen Description   Final    BLOOD RIGHT HAND Performed at [facility], [place] [address], [place], [place] [address]    Special Requests   Final    BOTTLES DRAWN AEROBIC AND ANAEROBIC Blood Culture results may not be optimal due to an inadequate volume of blood received in culture bottles Performed at [facility] [address] [place], [place] [address]    Culture   Final    NO GROWTH 5 DAYS Performed at [facility], [place] [address] [place], [place] [address]     Report Status 05/30/2022 FINAL  Final  Respiratory (~20 pathogens) panel by PCR     Status: Abnormal   Collection Time: 05/30/2022  8:04 PM   Specimen: Nasopharyngeal Swab; Respiratory  Result Value Ref Range Status   Adenovirus NOT DETECTED NOT DETECTED Final   Coronavirus 229E NOT DETECTED NOT DETECTED Final    Comment: (NOTE) The Coronavirus on the Respiratory Panel, DOES NOT test for the novel  Coronavirus (2019 nCoV)    Coronavirus HKU1 NOT DETECTED NOT DETECTED Final   Coronavirus NL63 NOT DETECTED NOT DETECTED Final   Coronavirus OC43 NOT DETECTED NOT DETECTED Final   Metapneumovirus DETECTED (A) NOT DETECTED Final   Rhinovirus / Enterovirus NOT DETECTED NOT DETECTED Final   Influenza A NOT DETECTED NOT DETECTED Final   Influenza B NOT DETECTED NOT DETECTED Final   Parainfluenza Virus 1 NOT DETECTED NOT DETECTED Final   Parainfluenza Virus 2 NOT DETECTED NOT DETECTED Final   Parainfluenza Virus 3 NOT DETECTED NOT DETECTED Final   Parainfluenza Virus 4 NOT DETECTED NOT DETECTED Final   Respiratory Syncytial Virus NOT DETECTED NOT DETECTED Final   Bordetella pertussis NOT DETECTED NOT DETECTED Final   Bordetella Parapertussis NOT DETECTED NOT DETECTED Final   Chlamydophila pneumoniae NOT DETECTED NOT DETECTED Final   Mycoplasma pneumoniae NOT DETECTED NOT DETECTED Final    Comment: Performed at [facility], [place] [address] [place], [place] [address]  MRSA Next Gen by PCR, Nasal     Status: None   Collection  Time: 06/04/2022 11:34 PM   Specimen: Nasal Mucosa; Nasal Swab  Result Value Ref Range Status   MRSA by PCR Next Gen NOT DETECTED NOT DETECTED Final    Comment: (NOTE) The GeneXpert MRSA Assay (FDA approved for NASAL specimens only), is one component of a comprehensive MRSA colonization surveillance program. It is not intended to diagnose MRSA infection nor to guide or monitor treatment for MRSA infections. Test performance is not FDA approved in patients less than  31 years old. Performed at [facility], [place] [address], [place], [place] [address]   Blood culture (routine x 2)     Status: None   Collection Time: 05/26/22  2:51 AM   Specimen: BLOOD  Result Value Ref Range Status   Specimen Description   Final    BLOOD BLOOD RIGHT HAND IN PEDIATRIC BOTTLE Performed at [facility] [address] [place], [place] [address]    Special Requests   Final    NONE Performed at [facility], [place] [address], [place], [place] [address]    Culture   Final    NO GROWTH 5 DAYS Performed at [facility], [place] [address], [place],  [address]    Report Status 05/31/2022 FINAL  Final    Time spent: 25 minutes  Signed: Terrilee Croak, MD 06-03-22

## 2022-06-18 NOTE — Progress Notes (Signed)
    OVERNIGHT PROGRESS REPORT   Notified by RN that patient has expired at 0430. Patient was comfort care.  2 RN verified.  Family has been notified by RN.    Raenette Rover, DNP, [place]

## 2022-06-18 DEATH — deceased

## 2022-06-27 ENCOUNTER — Encounter: Payer: Medicare Other | Admitting: Cardiology

## 2022-07-04 ENCOUNTER — Inpatient Hospital Stay: Payer: Medicare Other | Admitting: Diagnostic Neuroimaging

## 2022-09-03 ENCOUNTER — Telehealth: Payer: Self-pay | Admitting: Cardiology

## 2022-09-03 NOTE — Telephone Encounter (Signed)
Pt's daughter Jerry Wood called stating that the pt had a device and want to know if she needs to send the remote home pacer back since the pt passed away.

## 2022-09-04 NOTE — Telephone Encounter (Signed)
I spoke with the patient daughter. I let her know that [facility] does not take their monitors back. She can discard it. She thanked me for the call back.
